# Patient Record
Sex: Male | Born: 1935 | Race: White | Hispanic: No | Marital: Married | Smoking: Former smoker
Health system: Southern US, Community
[De-identification: ages and names within clinical notes are randomized; demographics above are authoritative.]

## PROBLEM LIST (undated history)

## (undated) DIAGNOSIS — I10 Essential (primary) hypertension: Secondary | ICD-10-CM

## (undated) DIAGNOSIS — I480 Paroxysmal atrial fibrillation: Secondary | ICD-10-CM

## (undated) DIAGNOSIS — R600 Localized edema: Secondary | ICD-10-CM

## (undated) DIAGNOSIS — G8929 Other chronic pain: Secondary | ICD-10-CM

## (undated) DIAGNOSIS — I428 Other cardiomyopathies: Secondary | ICD-10-CM

## (undated) DIAGNOSIS — M545 Low back pain, unspecified: Secondary | ICD-10-CM

## (undated) DIAGNOSIS — F1010 Alcohol abuse, uncomplicated: Secondary | ICD-10-CM

## (undated) DIAGNOSIS — Z9289 Personal history of other medical treatment: Secondary | ICD-10-CM

## (undated) DIAGNOSIS — I5032 Chronic diastolic (congestive) heart failure: Secondary | ICD-10-CM

## (undated) DIAGNOSIS — R2681 Unsteadiness on feet: Secondary | ICD-10-CM

## (undated) DIAGNOSIS — D649 Anemia, unspecified: Secondary | ICD-10-CM

## (undated) DIAGNOSIS — R001 Bradycardia, unspecified: Secondary | ICD-10-CM

## (undated) DIAGNOSIS — Z86718 Personal history of other venous thrombosis and embolism: Secondary | ICD-10-CM

## (undated) DIAGNOSIS — M1990 Unspecified osteoarthritis, unspecified site: Secondary | ICD-10-CM

## (undated) HISTORY — PX: TONSILLECTOMY AND ADENOIDECTOMY: SUR1326

## (undated) HISTORY — DX: Essential (primary) hypertension: I10

## (undated) HISTORY — PX: CARPAL TUNNEL RELEASE: SHX101

## (undated) HISTORY — DX: Unspecified osteoarthritis, unspecified site: M19.90

## (undated) HISTORY — PX: CAROTID ENDARTERECTOMY: SUR193

## (undated) HISTORY — DX: Paroxysmal atrial fibrillation: I48.0

## (undated) HISTORY — DX: Chronic diastolic (congestive) heart failure: I50.32

## (undated) HISTORY — DX: Other cardiomyopathies: I42.8

## (undated) HISTORY — DX: Unsteadiness on feet: R26.81

## (undated) HISTORY — DX: Personal history of other venous thrombosis and embolism: Z86.718

## (undated) HISTORY — PX: GANGLION CYST EXCISION: SHX1691

## (undated) HISTORY — DX: Alcohol abuse, uncomplicated: F10.10

## (undated) HISTORY — PX: JOINT REPLACEMENT: SHX530

## (undated) HISTORY — PX: CATARACT EXTRACTION W/ INTRAOCULAR LENS IMPLANT: SHX1309

---

## 1999-02-07 ENCOUNTER — Encounter: Payer: Self-pay | Admitting: Orthopedic Surgery

## 1999-02-07 ENCOUNTER — Inpatient Hospital Stay (HOSPITAL_COMMUNITY): Admission: EM | Admit: 1999-02-07 | Discharge: 1999-02-13 | Payer: Self-pay | Admitting: Orthopedic Surgery

## 1999-03-31 ENCOUNTER — Encounter: Payer: Self-pay | Admitting: Family Medicine

## 1999-03-31 ENCOUNTER — Ambulatory Visit (HOSPITAL_COMMUNITY): Admission: RE | Admit: 1999-03-31 | Discharge: 1999-03-31 | Payer: Self-pay | Admitting: Family Medicine

## 1999-04-15 ENCOUNTER — Encounter: Payer: Self-pay | Admitting: Neurological Surgery

## 1999-04-19 ENCOUNTER — Encounter: Payer: Self-pay | Admitting: Neurological Surgery

## 1999-04-19 ENCOUNTER — Inpatient Hospital Stay (HOSPITAL_COMMUNITY): Admission: RE | Admit: 1999-04-19 | Discharge: 1999-04-20 | Payer: Self-pay | Admitting: Neurological Surgery

## 1999-04-30 ENCOUNTER — Ambulatory Visit (HOSPITAL_COMMUNITY): Admission: RE | Admit: 1999-04-30 | Discharge: 1999-04-30 | Payer: Self-pay | Admitting: Orthopedic Surgery

## 1999-04-30 ENCOUNTER — Encounter: Payer: Self-pay | Admitting: Orthopedic Surgery

## 1999-05-11 ENCOUNTER — Encounter: Payer: Self-pay | Admitting: Neurological Surgery

## 1999-05-11 ENCOUNTER — Encounter: Admission: RE | Admit: 1999-05-11 | Discharge: 1999-05-11 | Payer: Self-pay | Admitting: Neurological Surgery

## 1999-06-18 ENCOUNTER — Encounter: Payer: Self-pay | Admitting: Neurological Surgery

## 1999-06-18 ENCOUNTER — Encounter: Admission: RE | Admit: 1999-06-18 | Discharge: 1999-06-18 | Payer: Self-pay | Admitting: Neurological Surgery

## 1999-07-19 DIAGNOSIS — Z9289 Personal history of other medical treatment: Secondary | ICD-10-CM

## 1999-07-19 HISTORY — PX: ANTERIOR FUSION CERVICAL SPINE: SUR626

## 1999-07-19 HISTORY — PX: TOTAL HIP ARTHROPLASTY: SHX124

## 1999-07-19 HISTORY — DX: Personal history of other medical treatment: Z92.89

## 2000-09-06 ENCOUNTER — Encounter: Admission: RE | Admit: 2000-09-06 | Discharge: 2000-12-05 | Payer: Self-pay | Admitting: Family Medicine

## 2000-10-12 ENCOUNTER — Ambulatory Visit (HOSPITAL_COMMUNITY): Admission: RE | Admit: 2000-10-12 | Discharge: 2000-10-12 | Payer: Self-pay | Admitting: Orthopedic Surgery

## 2000-10-12 ENCOUNTER — Encounter: Payer: Self-pay | Admitting: Orthopedic Surgery

## 2000-12-06 ENCOUNTER — Encounter: Admission: RE | Admit: 2000-12-06 | Discharge: 2001-01-16 | Payer: Self-pay | Admitting: Family Medicine

## 2001-01-30 ENCOUNTER — Ambulatory Visit (HOSPITAL_COMMUNITY): Admission: RE | Admit: 2001-01-30 | Discharge: 2001-01-30 | Payer: Self-pay | Admitting: *Deleted

## 2002-10-31 ENCOUNTER — Encounter: Payer: Self-pay | Admitting: Neurological Surgery

## 2002-11-05 ENCOUNTER — Encounter (INDEPENDENT_AMBULATORY_CARE_PROVIDER_SITE_OTHER): Payer: Self-pay | Admitting: Specialist

## 2002-11-05 ENCOUNTER — Ambulatory Visit (HOSPITAL_COMMUNITY): Admission: RE | Admit: 2002-11-05 | Discharge: 2002-11-05 | Payer: Self-pay | Admitting: Neurological Surgery

## 2003-02-25 ENCOUNTER — Ambulatory Visit (HOSPITAL_BASED_OUTPATIENT_CLINIC_OR_DEPARTMENT_OTHER): Admission: RE | Admit: 2003-02-25 | Discharge: 2003-02-25 | Payer: Self-pay | Admitting: Orthopedic Surgery

## 2003-09-04 ENCOUNTER — Emergency Department (HOSPITAL_COMMUNITY): Admission: EM | Admit: 2003-09-04 | Discharge: 2003-09-04 | Payer: Self-pay | Admitting: Emergency Medicine

## 2003-09-15 ENCOUNTER — Encounter: Admission: RE | Admit: 2003-09-15 | Discharge: 2003-09-15 | Payer: Self-pay | Admitting: Family Medicine

## 2003-09-15 ENCOUNTER — Emergency Department (HOSPITAL_COMMUNITY): Admission: EM | Admit: 2003-09-15 | Discharge: 2003-09-16 | Payer: Self-pay | Admitting: Emergency Medicine

## 2004-09-08 ENCOUNTER — Encounter: Admission: RE | Admit: 2004-09-08 | Discharge: 2004-09-08 | Payer: Self-pay | Admitting: Family Medicine

## 2004-12-01 ENCOUNTER — Ambulatory Visit (HOSPITAL_COMMUNITY): Admission: RE | Admit: 2004-12-01 | Discharge: 2004-12-01 | Payer: Self-pay | Admitting: Neurological Surgery

## 2005-02-07 ENCOUNTER — Encounter: Admission: RE | Admit: 2005-02-07 | Discharge: 2005-02-07 | Payer: Self-pay | Admitting: Family Medicine

## 2005-06-16 ENCOUNTER — Encounter: Admission: RE | Admit: 2005-06-16 | Discharge: 2005-06-16 | Payer: Self-pay | Admitting: Family Medicine

## 2006-02-13 ENCOUNTER — Ambulatory Visit (HOSPITAL_COMMUNITY): Admission: RE | Admit: 2006-02-13 | Discharge: 2006-02-13 | Payer: Self-pay | Admitting: *Deleted

## 2006-09-19 ENCOUNTER — Ambulatory Visit: Payer: Self-pay | Admitting: Vascular Surgery

## 2007-07-03 ENCOUNTER — Ambulatory Visit: Payer: Self-pay | Admitting: Vascular Surgery

## 2007-07-10 ENCOUNTER — Encounter: Admission: RE | Admit: 2007-07-10 | Discharge: 2007-07-10 | Payer: Self-pay | Admitting: Family Medicine

## 2007-11-05 ENCOUNTER — Encounter: Admission: RE | Admit: 2007-11-05 | Discharge: 2008-02-03 | Payer: Self-pay | Admitting: Family Medicine

## 2008-03-03 ENCOUNTER — Encounter: Admission: RE | Admit: 2008-03-03 | Discharge: 2008-03-03 | Payer: Self-pay | Admitting: Family Medicine

## 2008-04-22 ENCOUNTER — Ambulatory Visit: Payer: Self-pay | Admitting: Vascular Surgery

## 2008-07-17 ENCOUNTER — Ambulatory Visit: Payer: Self-pay | Admitting: Vascular Surgery

## 2008-08-27 ENCOUNTER — Inpatient Hospital Stay (HOSPITAL_COMMUNITY): Admission: RE | Admit: 2008-08-27 | Discharge: 2008-08-28 | Payer: Self-pay | Admitting: Vascular Surgery

## 2008-08-27 ENCOUNTER — Ambulatory Visit: Payer: Self-pay | Admitting: Vascular Surgery

## 2008-08-27 ENCOUNTER — Encounter: Payer: Self-pay | Admitting: Vascular Surgery

## 2008-09-16 ENCOUNTER — Ambulatory Visit: Payer: Self-pay | Admitting: Vascular Surgery

## 2008-10-10 ENCOUNTER — Ambulatory Visit: Payer: Self-pay | Admitting: Vascular Surgery

## 2008-10-13 ENCOUNTER — Ambulatory Visit: Payer: Self-pay | Admitting: Vascular Surgery

## 2009-03-31 ENCOUNTER — Ambulatory Visit: Payer: Self-pay | Admitting: Vascular Surgery

## 2009-07-09 ENCOUNTER — Ambulatory Visit: Payer: Self-pay | Admitting: Vascular Surgery

## 2009-07-09 ENCOUNTER — Encounter (INDEPENDENT_AMBULATORY_CARE_PROVIDER_SITE_OTHER): Payer: Self-pay | Admitting: Family Medicine

## 2009-07-09 ENCOUNTER — Ambulatory Visit (HOSPITAL_COMMUNITY): Admission: RE | Admit: 2009-07-09 | Discharge: 2009-07-09 | Payer: Self-pay | Admitting: Family Medicine

## 2009-07-16 ENCOUNTER — Ambulatory Visit (HOSPITAL_COMMUNITY): Admission: RE | Admit: 2009-07-16 | Discharge: 2009-07-16 | Payer: Self-pay | Admitting: Family Medicine

## 2009-08-20 ENCOUNTER — Ambulatory Visit (HOSPITAL_COMMUNITY): Admission: RE | Admit: 2009-08-20 | Discharge: 2009-08-20 | Payer: Self-pay | Admitting: Family Medicine

## 2009-10-06 ENCOUNTER — Ambulatory Visit: Payer: Self-pay | Admitting: Vascular Surgery

## 2010-08-07 ENCOUNTER — Encounter: Payer: Self-pay | Admitting: Family Medicine

## 2010-08-08 ENCOUNTER — Encounter: Payer: Self-pay | Admitting: Neurological Surgery

## 2010-09-21 ENCOUNTER — Other Ambulatory Visit: Payer: Self-pay

## 2010-10-05 ENCOUNTER — Other Ambulatory Visit (INDEPENDENT_AMBULATORY_CARE_PROVIDER_SITE_OTHER): Payer: Medicare Other

## 2010-10-05 DIAGNOSIS — Z48812 Encounter for surgical aftercare following surgery on the circulatory system: Secondary | ICD-10-CM

## 2010-10-05 DIAGNOSIS — I6529 Occlusion and stenosis of unspecified carotid artery: Secondary | ICD-10-CM

## 2010-10-12 NOTE — Procedures (Unsigned)
CAROTID DUPLEX EXAM  INDICATION:  Follow up left carotid endarterectomy.  HISTORY: Diabetes:  No. Cardiac:  No. Hypertension:  Yes. Smoking:  Previous. Previous Surgery:  Left carotid endarterectomy, 08/27/2008. CV History:  Currently asymptomatic. Amaurosis Fugax No, Paresthesias No, Hemiparesis No.                                      RIGHT             LEFT Brachial systolic pressure:         138               135 Brachial Doppler waveforms:         Normal            Normal Vertebral direction of flow:        Antegrade         Antegrade DUPLEX VELOCITIES (cm/sec) CCA peak systolic                   77                74 ECA peak systolic                   120               189 ICA peak systolic                   227               71 ICA end diastolic                   61                22 PLAQUE MORPHOLOGY:                  Mixed             Mixed PLAQUE AMOUNT:                      Moderate          Moderate PLAQUE LOCATION:                    ICA, ECA          ECA  IMPRESSION: 1. Right internal carotid artery velocities suggest 60% to 79%     stenosis. 2. Patent left carotid endarterectomy site with no evidence of     restenosis of the internal carotid artery. 3. Bilateral external carotid artery stenosis.  ___________________________________________ Quita Skye Hart Rochester, M.D.  EM/MEDQ  D:  10/06/2010  T:  10/06/2010  Job:  [id number]

## 2010-11-02 LAB — COMPREHENSIVE METABOLIC PANEL
ALT: 26 U/L (ref 0–53)
AST: 24 U/L (ref 0–37)
Albumin: 3.9 g/dL (ref 3.5–5.2)
Alkaline Phosphatase: 81 U/L (ref 39–117)
BUN: 44 mg/dL — ABNORMAL HIGH (ref 6–23)
CO2: 23 mEq/L (ref 19–32)
Calcium: 9.2 mg/dL (ref 8.4–10.5)
Chloride: 106 mEq/L (ref 96–112)
Creatinine, Ser: 1.54 mg/dL — ABNORMAL HIGH (ref 0.4–1.5)
GFR calc Af Amer: 53 mL/min — ABNORMAL LOW (ref >60)
GFR calc non Af Amer: 44 mL/min — ABNORMAL LOW (ref >60)
Glucose, Bld: 91 mg/dL (ref 70–99)
Potassium: 5.7 mEq/L — ABNORMAL HIGH (ref 3.5–5.1)
Sodium: 135 mEq/L (ref 135–145)
Total Bilirubin: 0.6 mg/dL (ref 0.3–1.2)
Total Protein: 6.3 g/dL (ref 6.0–8.3)

## 2010-11-02 LAB — CBC
HCT: 28.8 % — ABNORMAL LOW (ref 39.0–52.0)
HCT: 35.5 % — ABNORMAL LOW (ref 39.0–52.0)
Hemoglobin: 10.1 g/dL — ABNORMAL LOW (ref 13.0–17.0)
Hemoglobin: 12.2 g/dL — ABNORMAL LOW (ref 13.0–17.0)
MCHC: 34.4 g/dL (ref 30.0–36.0)
MCHC: 35 g/dL (ref 30.0–36.0)
MCV: 97.9 fL (ref 78.0–100.0)
MCV: 98.5 fL (ref 78.0–100.0)
Platelets: 132 10*3/uL — ABNORMAL LOW (ref 150–400)
Platelets: 167 10*3/uL (ref 150–400)
RBC: 2.95 MIL/uL — ABNORMAL LOW (ref 4.22–5.81)
RBC: 3.61 MIL/uL — ABNORMAL LOW (ref 4.22–5.81)
RDW: 13.6 % (ref 11.5–15.5)
RDW: 14.1 % (ref 11.5–15.5)
WBC: 10.8 10*3/uL — ABNORMAL HIGH (ref 4.0–10.5)
WBC: 8.7 10*3/uL (ref 4.0–10.5)

## 2010-11-02 LAB — BASIC METABOLIC PANEL
BUN: 26 mg/dL — ABNORMAL HIGH (ref 6–23)
CO2: 22 mEq/L (ref 19–32)
Calcium: 8.2 mg/dL — ABNORMAL LOW (ref 8.4–10.5)
Chloride: 108 mEq/L (ref 96–112)
Creatinine, Ser: 0.99 mg/dL (ref 0.4–1.5)
GFR calc Af Amer: >60 mL/min (ref >60)
GFR calc non Af Amer: >60 mL/min (ref >60)
Glucose, Bld: 123 mg/dL — ABNORMAL HIGH (ref 70–99)
Potassium: 3.9 mEq/L (ref 3.5–5.1)
Sodium: 134 mEq/L — ABNORMAL LOW (ref 135–145)

## 2010-11-02 LAB — CROSSMATCH
ABO/RH(D): A POS
Antibody Screen: NEGATIVE

## 2010-11-02 LAB — URINALYSIS, ROUTINE W REFLEX MICROSCOPIC
Bilirubin Urine: NEGATIVE
Glucose, UA: NEGATIVE mg/dL
Hgb urine dipstick: NEGATIVE
Ketones, ur: NEGATIVE mg/dL
Nitrite: NEGATIVE
Protein, ur: NEGATIVE mg/dL
Specific Gravity, Urine: 1.019 (ref 1.005–1.030)
Urobilinogen, UA: 0.2 mg/dL (ref 0.0–1.0)
pH: 6 (ref 5.0–8.0)

## 2010-11-02 LAB — ABO/RH: ABO/RH(D): A POS

## 2010-11-02 LAB — POTASSIUM: Potassium: 4.4 mEq/L (ref 3.5–5.1)

## 2010-11-02 LAB — PROTIME-INR
INR: 1.1 (ref 0.00–1.49)
Prothrombin Time: 14.4 seconds (ref 11.6–15.2)

## 2010-11-02 LAB — APTT: aPTT: 30 seconds (ref 24–37)

## 2010-11-30 NOTE — Assessment & Plan Note (Signed)
OFFICE VISIT   MUSCAB, BRENNEMAN  DOB:  1926/08/14                                       04/22/2008  JWJXB#:[id number]   The patient returned today for his annual followup for carotid occlusive  disease which we have been following for several years.  He has a known  moderate to moderately severe left internal carotid stenosis which has  remained asymptomatic.  I last saw him December 2008.  He had no  symptoms of ischemia, such as hemiparesis, aphasia, amaurosis fugax,  diplopia, blurred vision or syncope.  He also has no chest pain, dyspnea  on exertion, PND or orthopnea.   PHYSICAL EXAM:  Today blood pressure 161/90, heart rate 76, respirations  14.  The carotid pulses 3+ with a soft bruit on the left.  Neurologic:  Normal.  Neck is somewhat rigid from a previous cervical spine fusion.  Chest:  Clear to auscultation.  Abdomen:  Soft, nontender with no  masses.  He has 3+ femoral popliteal pulses bilaterally with well-  perfused lower extremities.   Carotid duplex exam today reveals significant progression of disease in  the last 9 months with a left internal carotid artery now being  approximate 85-90% stenotic in severity.  The right internal carotid  being approximately 70% stenotic.  I have discussed this with the  patient and I have recommended proceeding with a left carotid  endarterectomy in the near future for this asymptomatic severe stenosis.  Risks and benefits of surgery have been thoroughly discussed and would  like to discuss this further with Dr. Artis Flock.  He has a fishing trip  planned in November of this year.  I have suggested that possibly the  surgery can be done shortly after he returns from that trip, unless he  develops any symptoms in the interim.   Quita Skye Hart Rochester, M.D.  Electronically Signed   JDL/MEDQ  D:  04/22/2008  T:  04/23/2008  Job:  [id number]

## 2010-11-30 NOTE — Procedures (Signed)
CAROTID DUPLEX EXAM   INDICATION:  Left carotid endarterectomy.   HISTORY:  Diabetes:  No.  Cardiac:  No.  Hypertension:  Yes.  Smoking:  Previous.  Previous Surgery:  Left carotid endarterectomy on 08/27/08.  CV History:  Currently asymptomatic.  Amaurosis Fugax No, Paresthesias No, Hemiparesis No.                                       RIGHT             LEFT  Brachial systolic pressure:         138               144  Brachial Doppler waveforms:         Normal            Normal  Vertebral direction of flow:        Not visualized    Antegrade  DUPLEX VELOCITIES (cm/sec)  CCA peak systolic                   93                104  ECA peak systolic                   173               230  ICA peak systolic                   208               57  ICA end diastolic                   47                21  PLAQUE MORPHOLOGY:                  Mixed  PLAQUE AMOUNT:                      Moderate          None  PLAQUE LOCATION:                    ICA   IMPRESSION:  1. Low-end 60-79% stenosis of the right internal carotid artery.  2. Patent left carotid endarterectomy site with no evidence of left      internal carotid artery stenosis.  3. No significant change noted when compared to the previous      examination on 10/10/08.   ___________________________________________  Quita Skye. Hart [place], M.D.   CH/MEDQ  D:  03/31/2009  T:  04/01/2009  Job:  [id number]

## 2010-11-30 NOTE — Procedures (Signed)
CAROTID DUPLEX EXAM   INDICATION:  Followup, carotid artery disease.   HISTORY:  Diabetes:  No.  Cardiac:  No.  Hypertension:  Yes.  Smoking:  Quit.  Previous Surgery:  No.  CV History:  No.  Amaurosis Fugax No, Paresthesias No, Hemiparesis No                                       RIGHT               LEFT  Brachial systolic pressure:         170                 170  Brachial Doppler waveforms:         Triphasic           Triphasic  Vertebral direction of flow:        Antegrade           Antegrade  DUPLEX VELOCITIES (cm/sec)  CCA peak systolic                   80                  75  ECA peak systolic                   192                 190  ICA peak systolic                   219                 277  ICA end diastolic                   70                  104  PLAQUE MORPHOLOGY:                  Calcified           Calcified  PLAQUE AMOUNT:                      Moderate            Moderate/severe  PLAQUE LOCATION:                    ICA/ECA/bifurcation  ICA/ECA/bifurcation   IMPRESSION:  1. The right internal carotid artery shows evidence of 60-79% stenosis      (low end of range), showing slight increase from previous study.  2. The left internal carotid artery shows evidence of 60-79% stenosis      (high end of range), showing no significant change from previous      study.   ___________________________________________  Quita Skye Hart [place], M.D.   AS/MEDQ  D:  07/03/2007  T:  07/04/2007  Job:  [id number]

## 2010-11-30 NOTE — H&P (Signed)
NAME:  Phillip Kelley, Phillip Kelley NO.:  [id number]   MEDICAL RECORD NO.:  [id number]          PATIENT TYPE:  INP   LOCATION:  3316                         FACILITY:  [facility]   PHYSICIAN:  Quita Skye. Hart Rochester, M.D.  DATE OF BIRTH:  [birth year]   DATE OF ADMISSION:  08/27/2008  DATE OF DISCHARGE:                              HISTORY & PHYSICAL   CHIEF COMPLAINT:  Severe left internal carotid occlusive disease -  asymptomatic.   HISTORY OF PRESENT ILLNESS:  This 77 year old male patient has been  followed for the last few years with bilateral carotid occlusive disease  in the 70% range on both sides.  This was asymptomatic.  His last exam  in October 2009 revealed significant progression of disease to between  85-90% severity on the left side.  He continues to be asymptomatic,  scheduled now for left carotid endarterectomy.   PAST MEDICAL HISTORY:  1. Hypertension.  2. Degenerative joint disease with previous cervical spine fusion.  3. Negative coronary artery disease, diabetes, COPD, or stroke.   PAST SURGERIES:  1. Mastoidectomy.  2. Right hip replacement.  3. Removal of bone from left foot.  4. Cervical spine fusion.   FAMILY HISTORY:  Positive for congestive heart failure in his mother and  diabetes in a son.   SOCIAL HISTORY:  He is married.  He has 3 children.  He is retired.  He  has not smoked since 1979.  He drinks occasional wine.   REVIEW OF SYSTEMS:  Mainly positive for arthritis with restricted motion  of the neck due to cervical spine fusion, joint pain, and muscle pain.   ALLERGIES:  PENICILLIN.   MEDICATIONS:  1. Ibuprofen six 200 mg tablets, 4 in the morning and 2 in the      afternoon.  2. one 81 mg tablet q.a.m.  3. Glucosamine/chondroitin 2 tablets 1 in the morning and 1 in the      p.m.  4. Vitamin E 400 mcg 1 the morning.  5. Calcium 600 plus D 1 in the morning.  6. Flomax 0.4 mg at 2 p.m.  7. Benicar/hydrochlorothiazide 20-12.5 one in the  morning.   PHYSICAL EXAMINATION:  VITAL SIGNS:  Blood pressure 160/90, heart rate  70, and respirations 14.  GENERAL:  He is an elderly male who is in no apparent distress, but has  obvious rigidity of his neck with limited anterior, posterior, and  mediolateral movement.  His carotid pulses 3+ with harsh bruit on the  left, softer bruit on the right.  NEUROLOGIC:  Normal.  NECK:  No palpable adenopathy in the neck.  CHEST:  Clear to auscultation.  CARDIOVASCULAR:  Regular rhythm.  No murmurs.  ABDOMEN:  Soft and nontender with no masses.  EXTREMITIES:  He has 3+ femoral pulse bilaterally with well-perfused  lower extremities.   Carotid duplex exam performed in the [facility] office on April 22, 2008,  revealed a 90% left internal carotid stenosis and a 70% right internal  carotid stenosis.   IMPRESSION:  Severe left internal carotid stenosis - asymptomatic.   PLAN:  To admit the patient  on August 27, 2008, for an elective left  carotid endarterectomy.  Risks and benefits have been thoroughly  discussed with the patient and he would like to proceed.      Quita Skye Hart Rochester, M.D.  Electronically Signed     JDL/MEDQ  D:  08/27/2008  T:  08/27/2008  Job:  [id number]

## 2010-11-30 NOTE — Assessment & Plan Note (Signed)
OFFICE VISIT   KAMARE, CASPERS  DOB:  1926/08/04                                       09/16/2008  MVHQI#:[id number]   This is a office visit.  The patient underwent a left carotid  endarterectomy with Dacron patch angioplasty plus resection of redundant  common carotid with primary reanastomosis by me on February 10 for  severe left internal carotid stenosis which is asymptomatic.  His  procedure was technically difficult because of a fused neck and very  little mobility or hyperextension.  He has had no problems and no  specific complaints.  He is swallowing well, has no hoarseness and  denies any hemiparesis, aphasia, amaurosis fugax, diplopia, blurred  vision or syncope.  He is taking aspirin 81 mg per day.   PHYSICAL EXAMINATION:  Blood pressure 144/76, heart rate 66,  respirations 14.  NECK:  Left neck incision is healed nicely.  Carotid pulses 3+ and no  audible bruits.  NEUROLOGIC:  Normal.   He is getting along well and will see him in 6 months with follow-up  carotid duplex exam.  He has moderate stenosis in the 60 to 70% range on  the right side.  If he has any symptoms in the interim, he will be in  touch.   Quita Skye Hart Rochester, M.D.  Electronically Signed   JDL/MEDQ  D:  09/16/2008  T:  09/17/2008  Job:  [id number]   cc:   Gloriajean Dell. Andrey Campanile, M.D.

## 2010-11-30 NOTE — Procedures (Signed)
CAROTID DUPLEX EXAM   INDICATION:  Left carotid endarterectomy.   HISTORY:  Diabetes:  No.  Cardiac:  No.  Hypertension:  Yes.  Smoking:  Previous.  Previous Surgery:  Left carotid endarterectomy on 08/27/2008.  CV History:  Currently asymptomatic.  Amaurosis Fugax No, Paresthesias No, Hemiparesis No                                       RIGHT             LEFT  Brachial systolic pressure:         144               132  Brachial Doppler waveforms:         Normal            Normal  Vertebral direction of flow:        Not visualized    Antegrade  DUPLEX VELOCITIES (cm/sec)  CCA peak systolic                   86                82  ECA peak systolic                   184               234  ICA peak systolic                   205               49  ICA end diastolic                   43                17  PLAQUE MORPHOLOGY:                  Mixed             Heterogeneous  PLAQUE AMOUNT:                      Moderate          Moderate  PLAQUE LOCATION:                    ICA / ECA         ECA   IMPRESSION:  1. Low end 60%-79% stenosis of the right internal carotid artery.  2. Patent left carotid endarterectomy site with no left internal      carotid artery stenosis.  3. Bilateral external carotid artery stenoses noted.  4. Unable to adequately visualize the right vertebral artery.  5. No significant change noted when compared to the previous exam on      03/31/2009.   ___________________________________________  Quita Skye. Hart Rochester, M.D.   CH/MEDQ  D:  10/06/2009  T:  10/07/2009  Job:  [id number]

## 2010-11-30 NOTE — Assessment & Plan Note (Signed)
OFFICE VISIT   DEANGELO, BERNS  DOB:  1927/01/22                                       10/13/2008  EAVWU#:[id number]   Mr. Arpin underwent left carotid endarterectomy by me on February 10,  for severe but asymptomatic left internal carotid artery stenosis.  He  came to the office three days ago with some mild erythema at the base of  the incision.  He had no chills or fever.  A Duplex scan revealed no  evidence of any fluid in the deep space around the artery.  No evidence  of infection or pseudoaneurysm.  He was started on Cipro which he has  taken through the weekend as well as warm compresses.  The incision  today is essentially healed with the exception of one small 2 mm opening  at the base where the Vicryl knot was tied.  This was examined and there  is no evidence of any significant cellulitis.  He will continue the warm  compresses and antibiotics, and return to see [place] in 6 months as  scheduled unless he has any evidence of progressive infection.  This  should heal over the next few days.   Quita Skye Hart Rochester, M.D.  Electronically Signed   JDL/MEDQ  D:  10/13/2008  T:  10/13/2008  Job:  [id number]

## 2010-11-30 NOTE — Op Note (Signed)
NAME:  ARISTOTELIS, VILARDI NO.:  [id number]   MEDICAL RECORD NO.:  [id number]           PATIENT TYPE:   LOCATION:                                 FACILITY:   PHYSICIAN:  Quita Skye. Hart Rochester, M.D.       DATE OF BIRTH:   DATE OF PROCEDURE:  08/27/2008  DATE OF DISCHARGE:                               OPERATIVE REPORT   PREOPERATIVE DIAGNOSIS:  Severe left internal carotid stenosis -  asymptomatic.   POSTOPERATIVE DIAGNOSIS:  Severe left internal carotid stenosis -  asymptomatic.   OPERATIONS:  1. Left carotid endarterectomy with Dacron patch angioplasties  2. Resection of redundant common carotid artery with primary re-      anastomosis.   SURGEON:  Quita Skye. Hart Rochester, MD   FIRST ASSISTANT:  Jerold Coombe, PA   ANESTHESIA:  General endotracheal.   BRIEF HISTORY:  This patient has been followed with moderate carotid  occlusive disease and his most recent study revealed progression of  disease on the left side approximately 85-90% severity.  He has a 60-70%  stenosis on the contralateral right side.  He is asymptomatic and  scheduled now for left carotid endarterectomy.   DESCRIPTION OF PROCEDURE:  The patient was taken to the operating room  and placed in the supine position at which time, satisfactory general  endotracheal anesthesia was administered.  The patient had a rigid neck  having had previous cervical spine fusion with no mobility of the neck,  either anterior-posterior or medial-laterally making this procedure  difficult.  Incision was made along the anterior border of the  sternocleidomastoid muscle and carried down through subcutaneous tissue  and platysma using the Bovie.  Common facial vein and external jugular  veins ligated with 3-0 silk ties and divided exposing the common  internal and external carotid arteries.  Care was taken not to injure  the vagus or hypoglossal nerves both of which were exposed.  There was a  calcified plaque at the  carotid bifurcation extending up the internal  carotid artery at least 4 cm.  Distal vessel had a significant kink  posteriorly at the end of the plaque and it was obvious that a resection  would be required to eliminate this kink.  After dissecting this all  free, the patient was heparinized and carotid vessels were occluded with  vascular clamps.  Longitudinal opening was made in the common carotid  with a 15 blade extended up the internal carotid with the Potts scissors  to a point distal to the disease.  The plaque was at least 80-90%  stenotic in severity.  Distal vessel appeared normal.  A #10 shunt was  inserted without difficulty re-establishing flow in about 2 minutes.  Standard endarterectomy was then performed using elevator and Potts  scissors with an eversion endarterectomy of the external carotid.  The  plaque feathered off to distal internal carotid artery nicely, not  requiring any tacking sutures.  Lumen was thoroughly irrigated with  heparin saline.  All loose debris was carefully removed and in order to  eliminate the kink, resection of about  3 cm of common carotid artery was  performed after using marking sutures of 6-0 Prolene.  The section of  artery was resected in the common carotid was done with Potts scissors  with a continuous re-anastomosis with 6-0 Prolene having been performed.  Following this, a patch was sewn into place.  Prior to completion of the  closure, shunt was removed after about 55 minutes of shunt time  following antegrade and retrograde flushing, closure was completed re-  establishing flow initially up the external and up the internal branch.  Carotid was occluded for less than 2 minutes for removal of the shunt.  Protamine was then given to reverse the heparin.  There was excellent  flow  in the external and internal carotid branches and the kink had been  eliminated.  Following adequate hemostasis, wound was closed in layers  with Vicryl in  subcuticular fashion.  Sterile dressing was applied.  The  patient taken to recovery room in satisfactory condition.      Quita Skye Hart Rochester, M.D.  Electronically Signed     JDL/MEDQ  D:  08/27/2008  T:  08/27/2008  Job:  [id number]

## 2010-11-30 NOTE — Procedures (Signed)
CAROTID DUPLEX EXAM   INDICATION:  Redness and pain around endarterectomy site.   HISTORY:  Diabetes:  No.  Cardiac:  No.  Hypertension:  Yes.  Smoking:  Former smoker.  Previous Surgery:  Left carotid endarterectomy with Dacron patch  angioplasty on 08/27/2008.  CV History:  Previous duplex on 04/22/2008 (preop) revealed 60-79% right  ICA stenosis and 80-99% left ICA stenosis.  Amaurosis Fugax No, Paresthesias No, Hemiparesis No                                       RIGHT             LEFT  Brachial systolic pressure:         180               176  Brachial Doppler waveforms:         Triphasic         Triphasic  Vertebral direction of flow:        Antegrade         Antegrade  DUPLEX VELOCITIES (cm/sec)  CCA peak systolic                   85                111  ECA peak systolic                   166               175  ICA peak systolic                   214               62  ICA end diastolic                   55                21  PLAQUE MORPHOLOGY:                  Calcified         None  PLAQUE AMOUNT:                      Moderate          None  PLAQUE LOCATION:                    Proximal ICA      None   IMPRESSION:  1. 40-59% right ICA stenosis.  2. No left ICA stenosis status post endarterectomy.  3. No evidence of pseudoaneurysm or AV fistula at the Dacron patch.      No obvious fluid collection seen around the Dacron patch.   ___________________________________________  Quita Skye. Hart Rochester, M.D.   MC/MEDQ  D:  10/10/2008  T:  10/10/2008  Job:  [id number]

## 2010-11-30 NOTE — Discharge Summary (Signed)
NAME:  Phillip Kelley, Phillip Kelley NO.:  [id number]   MEDICAL RECORD NO.:  [id number]          PATIENT TYPE:  INP   LOCATION:  3316                         FACILITY:  [facility]   PHYSICIAN:  Quita Skye. Hart Rochester, M.D.  DATE OF BIRTH:  November 12, 1926   DATE OF ADMISSION:  08/27/2008  DATE OF DISCHARGE:  08/28/2008                               DISCHARGE SUMMARY   FINAL DISCHARGE DIAGNOSES:  1. Peripheral vascular disease resulting in left carotid occlusive      disease, which is asymptomatic.  2. Hypertension.   PROCEDURE PERFORMED:  Left carotid endarterectomy with resection of  redundant left common carotid artery by Dr. Hart Rochester on August 27, 2008.   COMPLICATIONS:  None.   CONDITION AT DISCHARGE:  Stable, improved.   DISCHARGE MEDICATIONS:  He is instructed to resume all of his previous  medications consisting of:  1. Benicar.  2. Hydrochlorothiazide 20/12.5 q.a.m.  3. Flomax 0.4 mg p.o. q.p.m.  4. Aspirin 81 mg p.o. daily.  5. Glucosamine triple strength b.i.d.  6. Ibuprofen 200 mg for q.a.m. to q.p.m.  7. Multivitamins 1 p.o. daily.  8. Calcium 600 plus D p.o. q.a.m.  9. He was given prescription for oxycodone 5 mg p.o. q.4 h. p.r.n.      pain, total number of 30 were given.   DISPOSITION:  He has been given careful instructions regarding the care  of his wounds and his activity level.  He was given a return appointment  to see Dr. Hart Rochester in 2 weeks for followup.   Brief identifying statement for complete details, please refer the typed  history and physical.  Briefly, this is a very pleasant 77 year old  gentleman status post neck fusion, was referred to Dr. Hart Rochester for  asymptomatic occlusive disease of his left internal carotid artery.  Dr.  Hart Rochester evaluated him and found him to be a suitable candidate for a  carotid endarterectomy.  He was informed of the risks and benefits of  the procedure and after careful consideration, elected to proceed with  surgery.   HOSPITAL COURSE:  Preoperative workup was completed as an outpatient.  He was brought in through the same day surgery and underwent the  aforementioned left carotid endarterectomy.  For complete details,  please refer to the typed operative report.  The procedure was without  complications.  He was returned to the Postanesthesia Care Unit  extubated.  Following stabilization, he was transferred to a bed on a  surgical step-down unit.  He was observed overnight.  The following  morning, his neurological exam is nonfocal.  His wound was healing well  and was subsequently discharged home in stable condition.      Wilmon Arms, PA      Quita Skye Hart Rochester, M.D.  Electronically Signed    KEL/MEDQ  D:  08/28/2008  T:  08/28/2008  Job:  [id number]

## 2010-11-30 NOTE — Assessment & Plan Note (Signed)
OFFICE VISIT   AXAVIER, PRESSLEY  DOB:  [birth year]                                       03/31/2009  ZOXWR#:[id number]   The patient returns having undergone a left carotid endarterectomy by me  in 08/2008 for severe left internal carotid stenosis which is  asymptomatic with resection of a redundant common carotid artery and  primary reanastomosis.  He has had a known 60-70% right internal carotid  artery stenosis which we have been following.  He denies any current  hemispheric or nonhemispheric TIAs, amaurosis fugax, diplopia, blurred  vision or syncope.  He is ambulating fairly well.  He denies any cardiac  symptoms such as chest pain or dyspnea on exertion.  He is taking one  aspirin 81 mg tablet per day.   PHYSICAL EXAM:  Blood pressure 151/78, heart rate 58, respirations 14.  Neck:  His carotid pulses are 3+ with soft bruit on the right.  Neurological:  Normal.  He continues to have significant cervical spine  fusion with flexion of the neck.  Abdomen:  Soft, nontender and no  masses palpable.  He has 3+ femoral and popliteal pulses bilaterally  with well-perfused lower extremities.   Carotid duplex exam today reveals a widely patent left carotid  endarterectomy site and a mild to moderate right internal carotid  stenosis approximating 60-70% - unchanged.  I have reassured him  regarding these findings and we will begin checking him on the carotid  protocol in 6 months and then on an annual basis unless he has any  evidence of progression of disease or develops any symptoms.   Quita Skye Hart Rochester, M.D.  Electronically Signed   JDL/MEDQ  D:  03/31/2009  T:  04/01/2009  Job:  [id number]

## 2010-11-30 NOTE — Assessment & Plan Note (Signed)
OFFICE VISIT   Phillip Kelley, Phillip Kelley  DOB:  March 01, 1927                                       07/03/2007  EAVWU#:[id number]   The patient returns today for continued followup regarding his carotid  occlusive disease.  Initially when discovered, he had had some dizziness  and syncope a few years ago, but he has had no neurologic symptoms in  the past 2 years, including hemiparesis, aphasia, amaurosis fugax,  diplopia, blurred vision, or syncope.  He also has no chest pain,  dyspnea on exertion, PND, orthopnea, or lower extremity claudication  symptoms.  He does have some limitation of his activities because of  stiffness of his neck.  He does take one aspirin per day.   PHYSICAL EXAMINATION:  Blood pressure 168/81, heart rate is 59,  respirations 16.  His carotid pulses are 3+ with soft bruits  bilaterally.  Neurologic exam is unremarkable.  No palpable adenopathy  in the neck.  Mobility of his neck is limited both anterior-posteriorly  and side to side.  Upper extremity pulses are 3+ bilaterally.  Abdomen  is soft and nontender with no palpable masses.  He has 3+ femoral,  popliteal, and posterior tibial pulses bilaterally.   Carotid duplex exam today show progressed in severity since 9 months ago  with approximately 80% left internal carotid stenosis and approximately  60-70% right internal carotid stenosis.   I discussed the pros and cons of proceeding with left carotid  endarterectomy.  It will be technically somewhat more difficulty because  of immobility of his neck, but certainly doable.  He would like to hold  off for now unless he develops any symptoms, and return in 9 months for  a  followup carotid scan.  He understands that there is some risk involved  in this and also the 1% stroke related to surgery if he should elect to  proceed.   Quita Skye Hart [place], M.D.  Electronically Signed   JDL/MEDQ  D:  07/03/2007  T:  07/04/2007  Job:  [id number]   cc:    Quita Skye. Artis Flock, M.D.

## 2010-11-30 NOTE — Procedures (Signed)
CAROTID DUPLEX EXAM   INDICATION:  Follow-up evaluation of known carotid artery disease.   HISTORY:  Diabetes:  No.  Cardiac:  No.  Hypertension:  Yes.  Smoking:  Patient is a former smoker.  Previous Surgery:  No.  CV History:  Previous duplex on 07/03/07 revealed 60-79% right ICA  stenosis and 60-79% left ICA stenosis.  Amaurosis Fugax No, Paresthesias No, Hemiparesis No.                                       RIGHT             LEFT  Brachial systolic pressure:         146               148  Brachial Doppler waveforms:         Triphasic         Triphasic  Vertebral direction of flow:        Antegrade         Antegrade  DUPLEX VELOCITIES (cm/sec)  CCA peak systolic                   82                74  ECA peak systolic                   192               165  ICA peak systolic                   271               336  ICA end diastolic                   72                128  PLAQUE MORPHOLOGY:                  Calcified         Calcified  PLAQUE AMOUNT:                      Moderate-to-severe                  Severe  PLAQUE LOCATION:                    Proximal ICA, ECA Proximal ICA   IMPRESSION:  1. 60-79% right internal carotid artery stenosis.  2. 80-99% left internal carotid artery stenosis.  3. Bilateral external carotid artery stenosis.   ___________________________________________  Quita Skye Hart [place], M.D.   MC/MEDQ  D:  04/22/2008  T:  04/22/2008  Job:  [id number]

## 2010-12-03 NOTE — Procedures (Signed)
[place]  Patient:    Phillip Kelley, Phillip Kelley                     MRN: [id number] Proc. Date: 01/30/01 Adm. Date:  [id number] Attending:  Sabino Gasser CC:         Quita Skye. Artis Flock, M.D.   Procedure Report  PROCEDURE PERFORMED:  Colonoscopy.  ENDOSCOPIST:  Sabino Gasser, M.D.  INDICATIONS FOR PROCEDURE:  Colon cancer screening.  ANESTHESIA:  Demerol 25 mg, Versed 2.5 mg.  He had hemoccult positive stools.  DESCRIPTION OF PROCEDURE:  With the patient mildly sedated in the left lateral decubitus position, a rectal examination was performed which was unremarkable.  Subsequently, the Olympus videoscopic variable stiffness colonoscope was inserted in the rectum and passed under direct vision into the cecum.  The cecum was identified by the ileocecal valve and appendiceal orifice, both of which were photographed.  From this point, the colonoscope was slowly withdrawn, taking circumferential views of the entire colonic mucosa, stopping only to photograph diverticula seen in the sigmoid colon until we reached the rectum, which appeared normal on direct view and showed internal hemorrhoids on retroflex view.  The endoscope was straightened and withdrawn.  Patients vital signs and pulse oximeter remained stable.  The patient tolerated the procedure well and without apparent complications.  FINDINGS:  Diverticulosis of sigmoid colon.  Internal hemorrhoids.  Otherwise unremarkable examination.  PLAN:  Will have the patient follow up with me as an outpatient. DD:  01/30/01 TD:  01/30/01 Job: [id number]

## 2010-12-03 NOTE — Op Note (Signed)
NAME:  Phillip Kelley, Phillip Kelley                        ACCOUNT NO.:  [id number]   MEDICAL RECORD NO.:  [id number]                   PATIENT TYPE:  AMB   LOCATION:  DSC                                  FACILITY:  [facility]   PHYSICIAN:  Leonides Grills, M.D.                  DATE OF BIRTH:  03-11-1927   DATE OF PROCEDURE:  02/25/2003  DATE OF DISCHARGE:                                 OPERATIVE REPORT   PREOPERATIVE DIAGNOSES:  1. Left foot ulcer.  2. Left medial cuneiform spur.  3. Left base of the 1st metatarsal spur.   POSTOPERATIVE DIAGNOSES:  1. Left foot ulcer.  2. Left medial cuneiform spur.  3. Left base of the 1st metatarsal spur.   OPERATION:  1. Irrigation and debridement to bone, left foot ulcer.  2. Excision of base of 1st metatarsal spur.  3. Excision left medial cuneiform spur.   ANESTHESIA:  General endotracheal tube anesthesia.   SURGEON:  Leonides Grills, M.D.   ASSISTANT:  Lianne Cure, P.A.   ESTIMATED BLOOD LOSS:  Minimal.   TOURNIQUET TIME:  Approximately  15 to 20 minutes.   DISPOSITION:  Stable to the recovery room.   INDICATIONS FOR PROCEDURE:  This is a 77 year old gentleman  who has  developed a spur underneath a ulcer which has been quite symptomatic since  March. He was consented for the above procedure. All risks which included  infection, neurovessel injury, recurrence of the spur and ulcer infection of  the deep bone, i.e. osteomyelitis were all explained and questions were  encouraged and answered.   DESCRIPTION OF PROCEDURE:  The patient was brought to the operating room and  placed in the supine position after adequate general endotracheal tube  anesthesia was administered as well as vancomycin 500 mg IV piggyback. The  left lower extremity was then prepped and draped in a sterile manner over a  proximally placed thigh tourniquet. The limb was gravity exsanguinated  anterior the tourniquet was elevated to 290 mmHg.   A longitudinal incision  just dorsal to the ulcer was then made. Dissection  was carried down to bone. The soft tissue was elevated circumferentially  around both the spur of the base of the 1st metatarsal as well as the median  cuneiform spur. Then with a curved 1/4 inch osteotome this was then removed.  This was then flushed and rounded off with a rongeur.   The ulcer was then debrided down to bone with a rongeur. I then copiously  irrigated both the wound and the ulcer with normal saline. The tourniquet  was deflated. Hemostasis was achieved.   The skin was closed with 4-0 nylon. A sterile dressing was applied. A hard  sole shoe was applied. The patient was stable to the recovery room.  Leonides Grills, M.D.    PB/MEDQ  D:  02/25/2003  T:  02/25/2003  Job:  [id number]

## 2010-12-03 NOTE — Op Note (Signed)
NAME:  Phillip Kelley, Phillip Kelley                        ACCOUNT NO.:  [id number]   MEDICAL RECORD NO.:  [id number]                   PATIENT TYPE:  OIB   LOCATION:  2899                                 FACILITY:  [facility]   PHYSICIAN:  Stefani Dama, M.D.               DATE OF BIRTH:  [birth year]   DATE OF PROCEDURE:  11/05/2002  DATE OF DISCHARGE:                                 OPERATIVE REPORT   PREOPERATIVE DIAGNOSES:  1. Right carpal tunnel syndrome.  2. Right ganglion cyst of wrist.   POSTOPERATIVE DIAGNOSES:  1. Right carpal tunnel syndrome.  2. Right ganglion cyst of wrist.   OPERATION/PROCEDURE:  Right carpal tunnel release and right resection of  right ganglion cyst of the wrist.   ANESTHESIA:  IV sedation with anesthesia monitoring and local.   INDICATIONS FOR PROCEDURE:  The patient is a 77 year old individual who has  had significant difficulty with right carpal tunnel syndrome.  He was  evaluated for this some time ago and he has been followed clinically  however, he has been experiencing progressive dysfunction of the right hand  and wrist.  At evaluation, it was noted that he had a ganglion cyst in the  region of the right wrist.  I advised that we may want to have him evaluated  by a hand surgeon however, the patient indicated that he wished for me to  resect the cyst and take care of his carpal tunnel syndrome.   DESCRIPTION OF PROCEDURE:  The patient was brought to the operating room  supine on the stretcher.  After the smooth induction of some IV sedation,  the right hand was prepped.  A stockinette was applied and then the area of  the wrist crease was identified and a line from the thenar crease to the  fourth ray was drawn.  A small Z-plasty was created across the wrist crease  and then an incision was carried down just over the palmaris longus tendon.  The incision was then opened.  The subcutaneous tissues were cauterized and  then divided carefully and then  the dissection was carried down to the  transverse carpal ligament.  The ligament was opened and sectioned very  cautiously, identifying blanching and flattening of the median nerve right  at the apex of the ligament itself.  As the wrist crease was crossed, there  was noted to be a significant mass off to the right side that involved the  medial aspect of the palmaris longus tendon but also placed pressure on the  medial aspect of the median nerve as it entered into the carpal tunnel.  The  mass was then dissected carefully.  It appeared to be investing the flexor  carpi radialis and required resection from around the tendon sheath itself.  The dissection was carried from proximal to cephalad.  As three-quarters of  the cyst was excised, it ruptured.  The synovium was  collected.  The rest of  the cyst dissection was carried out towards the distal end.  In the end, the  cyst was resected from around the tendon sheath.  Cautery was used carefully  to make sure that the field was dry and then the incision was closed with 3-  0 Vicryl in the subcuticular tissues and 4-0 nylon in the skin.  The patient  tolerated the procedure well and was returned to the recovery room in stable  condition.                                               Stefani Dama, M.D.   Merla Riches  D:  11/05/2002  T:  11/05/2002  Job:  [id number]

## 2010-12-03 NOTE — Op Note (Signed)
NAME:  ALVAN, CULPEPPER NO.:  [id number]   MEDICAL RECORD NO.:  [id number]          PATIENT TYPE:  AMB   LOCATION:  ENDO                         FACILITY:  [facility]   PHYSICIAN:  Georgiana Spinner, M.D.    DATE OF BIRTH:  07/20/1926   DATE OF PROCEDURE:  DATE OF DISCHARGE:                                 OPERATIVE REPORT   PROCEDURE:  Colonoscopy.   INDICATIONS:  Hemoccult positivity.  History of colon polyps.   ANESTHESIA:  Fentanyl 50 mcg, Versed 5 mg.   PROCEDURE:  With the patient mildly sedated in the left lateral decubitus  position, the Olympus videoscopic colonoscope was inserted in the rectum  after a rectal examination was performed.  It passed under direct vision to  the cecum identified by ileocecal valve and appendiceal orifice both of  which were photographed.  From this point, the colonoscope was slowly  withdrawn taking circumferential views of colonic mucosa stopping to  photograph diverticula seen along the way mostly in the sigmoid colon until  we reached the rectum, which appeared normal and the rectum showed  hemorrhoids on retroflex view.  The endoscope was straightened and  withdrawn.  The patient's vital signs and pulse oximeter remained stable.  The patient tolerated procedure well without apparent complications.   FINDINGS:  Internal hemorrhoids, diverticulosis of sigmoid colon, much more  so than right colon.   PLAN:  Consider repeat examination in 5-10 years if clinically relevant.           ______________________________  Georgiana Spinner, M.D.     GMO/MEDQ  D:  02/13/2006  T:  02/13/2006  Job:  [id number]

## 2010-12-29 ENCOUNTER — Other Ambulatory Visit (HOSPITAL_COMMUNITY): Payer: Self-pay | Admitting: Family Medicine

## 2010-12-29 DIAGNOSIS — R609 Edema, unspecified: Secondary | ICD-10-CM

## 2010-12-30 ENCOUNTER — Other Ambulatory Visit (HOSPITAL_COMMUNITY): Payer: Self-pay | Admitting: Family Medicine

## 2010-12-30 ENCOUNTER — Ambulatory Visit (HOSPITAL_COMMUNITY): Payer: Medicare Other | Attending: Family Medicine | Admitting: Radiology

## 2010-12-30 DIAGNOSIS — I10 Essential (primary) hypertension: Secondary | ICD-10-CM | POA: Insufficient documentation

## 2010-12-30 DIAGNOSIS — I080 Rheumatic disorders of both mitral and aortic valves: Secondary | ICD-10-CM | POA: Insufficient documentation

## 2010-12-30 DIAGNOSIS — R0989 Other specified symptoms and signs involving the circulatory and respiratory systems: Secondary | ICD-10-CM | POA: Insufficient documentation

## 2010-12-30 DIAGNOSIS — R609 Edema, unspecified: Secondary | ICD-10-CM

## 2010-12-30 DIAGNOSIS — M7989 Other specified soft tissue disorders: Secondary | ICD-10-CM | POA: Insufficient documentation

## 2010-12-30 DIAGNOSIS — R0609 Other forms of dyspnea: Secondary | ICD-10-CM | POA: Insufficient documentation

## 2010-12-30 DIAGNOSIS — R600 Localized edema: Secondary | ICD-10-CM

## 2010-12-30 DIAGNOSIS — I379 Nonrheumatic pulmonary valve disorder, unspecified: Secondary | ICD-10-CM | POA: Insufficient documentation

## 2010-12-31 ENCOUNTER — Encounter (HOSPITAL_COMMUNITY): Payer: Self-pay

## 2011-01-04 ENCOUNTER — Telehealth: Payer: Self-pay | Admitting: *Deleted

## 2011-01-04 NOTE — Telephone Encounter (Signed)
Message copied by Burnell Blanks on Tue Jan 04, 2011  4:24 PM ------      Message from: Cassell Clement      Created: Fri Dec 31, 2010  1:02 PM       Results can be reported by his primary care physician.

## 2011-01-04 NOTE — Telephone Encounter (Signed)
Faxed results to Dr Kelby Fam as requested by Dr Patty Sermons

## 2011-01-06 ENCOUNTER — Telehealth: Payer: Self-pay | Admitting: *Deleted

## 2011-01-06 NOTE — Telephone Encounter (Signed)
Message copied by Burnell Blanks on Thu Jan 06, 2011 10:00 AM ------      Message from: Cassell Clement      Created: Fri Dec 31, 2010  1:02 PM       Results can be reported by his primary care physician.

## 2011-01-06 NOTE — Telephone Encounter (Signed)
Faxed results to PCP 

## 2011-03-31 ENCOUNTER — Ambulatory Visit (INDEPENDENT_AMBULATORY_CARE_PROVIDER_SITE_OTHER): Payer: Medicare Other | Admitting: Family Medicine

## 2011-03-31 ENCOUNTER — Encounter: Payer: Self-pay | Admitting: Family Medicine

## 2011-03-31 VITALS — BP 188/83 | HR 62 | Temp 98.0°F | Ht 67.0 in | Wt 150.5 lb

## 2011-03-31 DIAGNOSIS — M545 Low back pain: Secondary | ICD-10-CM

## 2011-03-31 DIAGNOSIS — R29818 Other symptoms and signs involving the nervous system: Secondary | ICD-10-CM

## 2011-03-31 DIAGNOSIS — K5900 Constipation, unspecified: Secondary | ICD-10-CM

## 2011-03-31 DIAGNOSIS — R269 Unspecified abnormalities of gait and mobility: Secondary | ICD-10-CM | POA: Insufficient documentation

## 2011-03-31 DIAGNOSIS — E559 Vitamin D deficiency, unspecified: Secondary | ICD-10-CM | POA: Insufficient documentation

## 2011-03-31 DIAGNOSIS — R2689 Other abnormalities of gait and mobility: Secondary | ICD-10-CM

## 2011-03-31 LAB — COMPREHENSIVE METABOLIC PANEL
CO2: 20 mEq/L (ref 19–32)
Calcium: 9.4 mg/dL (ref 8.4–10.5)
Chloride: 109 mEq/L (ref 96–112)
Creat: 1.21 mg/dL (ref 0.50–1.35)
Glucose, Bld: 92 mg/dL (ref 70–99)
Total Bilirubin: 0.4 mg/dL (ref 0.3–1.2)

## 2011-03-31 LAB — CBC
HCT: 36.7 % — ABNORMAL LOW (ref 39.0–52.0)
Hemoglobin: 12 g/dL — ABNORMAL LOW (ref 13.0–17.0)
MCH: 32.2 pg (ref 26.0–34.0)
MCV: 98.4 fL (ref 78.0–100.0)
RBC: 3.73 MIL/uL — ABNORMAL LOW (ref 4.22–5.81)
WBC: 13.4 10*3/uL — ABNORMAL HIGH (ref 4.0–10.5)

## 2011-03-31 MED ORDER — TRAMADOL-ACETAMINOPHEN 37.5-325 MG PO TABS: 1.0000 | ORAL_TABLET | Freq: Four times a day (QID) | ORAL | Status: AC | PRN

## 2011-03-31 MED ORDER — POLYETHYLENE GLYCOL 3350 17 GM/SCOOP PO POWD: 17.0000 g | Freq: Every day | ORAL | Status: DC

## 2011-03-31 NOTE — Assessment & Plan Note (Signed)
Polyeth glycol.

## 2011-03-31 NOTE — Assessment & Plan Note (Signed)
He reports being outside quit a bit, check for Vitamin D def.

## 2011-03-31 NOTE — Assessment & Plan Note (Signed)
Likely multifactorial, recommend treating and investigating reversible causes.

## 2011-03-31 NOTE — Assessment & Plan Note (Signed)
Patient is using NSAIDS exclusively, taking up to 4 doses a day. He reports pain control with tramadol but stopped using it because of constipation.  Discussed that we can add a osmotic laxative to treat this.  Recommended tramadol/acetaminopthen 37.5/325, one tid scheduled, with polyethylene glycol daily to bid. He may also use his naprosyn but less often, concern for renal failure and GI bleeding in the elderly.

## 2011-03-31 NOTE — Progress Notes (Signed)
Subjective:    Patient ID: Phillip Kelley, male    DOB: May 02, 1927, 77 y.o.   MRN: [id number]  HPI Referred to Dr. Sheffield Slider from Dr. Larita Fife to evaluate gait abnormality described as staggering and stumbling', frequent falls since 2009.  He currently uses a cane, has safety bars in the house.  Additionally he notices that last year he was able to walk a mile on a trail near his home, lately he has to stop 3 times taking the garbage to the curb.  His tolerance to exercise if limited both by low back pain that is significant and chronic and shortness of breath that seems to be new. His last fall was last week.   PMH:is significant for 1.  Left mastoidectomy in 1968 for what he thinks is mastoiditis, he had two surgeries in [facility] and has not been able to hear out of that ear since, he reports balance problems after the surgery that resolved. 2.  Left cataract surgery with complication and still has cloudiness and poor vision, right cataract needs to be done. 3.  Bilateral shoulder immobility secondary to rotator cuff syndrome. 4.  Persistent low back pain 5.  Cervical fusion in 2001 with complication of anhydrosis from neck down since 6.  Right Total hip replacement-with revision within one year 2000-2001 7. Carotid enterectomy (date unknown) for 90% stenosis  He is concerned about his memory, has to write things down.  He manages his finances, drives, and is independent in all IADLs and ADLs. He is married his wife is well and age 69.  He describes his past alcohol intake as moderate to heavy, he now has one glass of wine a day, sometimes 2.  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  He was a very active individual in the past, played tennis, golf, went skiing.  He denies depression but he angry that he has become so debilitated.   Back pain is not well controlled, uses NSAIDS mostly.  Reports tramadol effective in the  past but caused constipation and he could not live with that.  Some days every step hurts him.  His pain does increase with walking.  He does not have a lot of pain during the night and at rest. He sleeps well, eats a balanced healthy diet.    Review of Systems  Constitutional: Positive for activity change. Negative for appetite change and fatigue.       Weighed 215 in his day, now 150#.   Respiratory: Positive for shortness of breath.        With exertion  Cardiovascular: Positive for leg swelling. Negative for chest pain.  Genitourinary:       Nocturia X2  Musculoskeletal: Positive for back pain, arthralgias and gait problem. Negative for joint swelling.       Joint pain, back, shoulder, neck pain.   Neurological: Negative for dizziness, syncope, weakness, light-headedness and numbness.  Hematological: Bruises/bleeds easily.  Psychiatric/Behavioral: Negative for confusion and dysphoric mood.       Objective:   Physical Exam  Constitutional: He is oriented to person, place, and time.       Alert, oriented, frail 77 year old male.  HENT:  Right Ear: External ear normal.  Mouth/Throat: Oropharynx is clear and moist.       Right ear, no hearing, unable to see any bony structures or a normal TM.  Eyes: EOM are  normal.  Neck:       Head forward position, marked reduction in ROM in all directions.  Cardiovascular: Normal rate, regular rhythm and normal heart sounds.   Pulmonary/Chest: Effort normal and breath sounds normal.  Musculoskeletal: He exhibits edema.       Neck marked reduction in ROM Shoulders only can extend to 45 degrees, bilaterally Atrophy of interossei muscles Cervical spine kyphotic Hip-decreased internal rotation on the left, right externally rotated Knees- valgus deformity Stooped stance 1 + ankle edema bilaterally    Lymphadenopathy:    He has no cervical adenopathy.  Neurological: He is alert and oriented to person, place, and time. No cranial nerve  deficit. He exhibits normal muscle tone. Coordination abnormal.       Abnormal romberg Required stand by guard during gait testing Gait not wide based LE:  proximal 4/5 symmetrical with difficulty going from sit to stand.   Sensory testing of feet: decreased sensation to touch, position sense intact  Skin:       Skin tears and scabs on shins, dry aging changes  Psychiatric: He has a normal mood and affect. His behavior is normal. Judgment and thought content normal.      Balance Abnormal Patient value  Sitting balance    Arise x [place] to stand without using arms  Attempts to arise x Postural movements normal  Immediate standing balance    Standing balance x Very kyphotic with head forward  Neck rotation x Very limited  Eyes closed x Romberg mildly unsteady  360 degree turn x Discontinuous  Sitting down x Uses arms   Gait Abnormal Patient value  Initiation of gait    Step length-left x Short steps,   Step length-right x Turns R foot out  Step height-left    Step height-right    Step symmetry    Step continuity    Path x Staggers backwards at times  Trunk    Walking stance            Assessment & Plan:

## 2011-03-31 NOTE — Patient Instructions (Signed)
We will send the full report to Dr. Samella Parr should expect to get the labs results in the mail as well It was very nice meeting you If Tramadol worked for you pain, but caused constipation:  You can resume the tramadol and take a laxitive once or twice a day Naprosyn has plenty of side effects, the risk of GI bleeding and an effect on your kidneys    We have prescribed a combination pill that includes tramadol and acetaminophen, you may take 3 every 8 house as needed.  For your bowels I have prescribed polyethylene glycol for your bowels We believe your balance problems are multifactorial, Dr. Sheffield Slider will likely recommend MRI of cervical and lumbar spine.

## 2011-03-31 NOTE — Assessment & Plan Note (Addendum)
Suspect there is no one reversible cause.  Cervical and Lumbar spinal stenosis likely present, with advanced DJD of shoulders, hands, hips, knees. Recommend MRI of cervical and lumbar spine.  Improved pain control (see low back pain). Check Vitamin B12 and Vitamin D.

## 2011-04-01 ENCOUNTER — Telehealth: Payer: Self-pay | Admitting: Family Medicine

## 2011-04-01 NOTE — Telephone Encounter (Signed)
Pt returned Dr Martin Majestic call and has to run out in about 30 min.

## 2011-04-03 ENCOUNTER — Encounter: Payer: Self-pay | Admitting: Family Medicine

## 2011-06-02 ENCOUNTER — Encounter: Payer: Self-pay | Admitting: Internal Medicine

## 2011-10-14 ENCOUNTER — Ambulatory Visit: Payer: Medicare Other

## 2011-10-14 ENCOUNTER — Other Ambulatory Visit: Payer: Medicare Other

## 2011-10-20 ENCOUNTER — Encounter: Payer: Self-pay | Admitting: Neurosurgery

## 2011-10-21 ENCOUNTER — Encounter: Payer: Self-pay | Admitting: Neurosurgery

## 2011-10-21 ENCOUNTER — Ambulatory Visit (INDEPENDENT_AMBULATORY_CARE_PROVIDER_SITE_OTHER): Payer: Medicare Other | Admitting: Neurosurgery

## 2011-10-21 ENCOUNTER — Ambulatory Visit (INDEPENDENT_AMBULATORY_CARE_PROVIDER_SITE_OTHER): Payer: Medicare Other | Admitting: *Deleted

## 2011-10-21 VITALS — BP 128/63 | HR 69 | Resp 16 | Ht 67.0 in | Wt 158.0 lb

## 2011-10-21 DIAGNOSIS — I6529 Occlusion and stenosis of unspecified carotid artery: Secondary | ICD-10-CM

## 2011-10-21 DIAGNOSIS — Z48812 Encounter for surgical aftercare following surgery on the circulatory system: Secondary | ICD-10-CM

## 2011-10-21 NOTE — Progress Notes (Signed)
[facility] [place] HISTORY AND PHYSICAL   CC: Six-month carotid duplex Referring Physician: Hart Rochester  History of Present Illness: This is an 78 year old patient of Dr. Candie Chroman is status post left CEA February 2010. Patient reports no difficulties since that time. He states he is always generally weak but this is no change from his baseline. Patient reports no signs of amaurosis fugax, TIA, CVA, dysphagia, diplopia or word finding difficulties.  Past Medical History  Diagnosis Date  . Arthritis   . Cataract   . Hypertension     ROS: [x]  Positive   [ ]  Denies    General: [ ]  Weight loss, [ ]  Fever, [ ]  chills Neurologic: [ ]  Dizziness, [ ]  Blackouts, [ ]  Seizure [ ]  Stroke, [ ]  "Mini stroke", [ ]  Slurred speech, [ ]  Temporary blindness; [ ]  weakness in arms or legs, [ ]  Hoarseness Cardiac: [ ]  Chest pain/pressure, [ ]  Shortness of breath at rest [ ]  Shortness of breath with exertion, [ ]  Atrial fibrillation or irregular heartbeat Vascular: [ ]  Pain in legs with walking, [ ]  Pain in legs at rest, [ ]  Pain in legs at night,  [ ]  Non-healing ulcer, [ ]  Blood clot in vein/DVT,   Pulmonary: [ ]  Home oxygen, [ ]  Productive cough, [ ]  Coughing up blood, [ ]  Asthma,  [ ]  Wheezing Musculoskeletal:  [ ]  Arthritis, [ ]  Low back pain, [ ]  Joint pain Hematologic: [ ]  Easy Bruising, [ ]  Anemia; [ ]  Hepatitis Gastrointestinal: [ ]  Blood in stool, [ ]  Gastroesophageal Reflux/heartburn, [ ]  Trouble swallowing Urinary: [ ]  chronic Kidney disease, [ ]  on HD - [ ]  MWF or [ ]  TTHS, [ ]  Burning with urination, [ ]  Difficulty urinating Skin: [ ]  Rashes, [ ]  Wounds Psychological: [ ]  Anxiety, [ ]  Depression   Social History History  Substance Use Topics  . Smoking status: Former Smoker    Types: Cigarettes  . Smokeless tobacco: Former Neurosurgeon    Quit date: 07/18/1977  . Alcohol Use: 4.2 oz/week    7 Glasses of wine per week    Family History No family history on  file.  Allergies  Allergen Reactions  . Penicillins   . Sulfa Antibiotics     Current Outpatient Prescriptions  Medication Sig Dispense Refill  . clopidogrel (PLAVIX) 75 MG tablet Take 75 mg by mouth daily.      Marland Kitchen aspirin 81 MG tablet Take 81 mg by mouth daily.        . Calcium Carbonate-Vitamin D (CALCIUM-VITAMIN D) 500-200 MG-UNIT per tablet Take 1 tablet by mouth 2 (two) times daily with a meal.        . losartan-hydrochlorothiazide (HYZAAR) 100-25 MG per tablet Take 1 tablet by mouth daily.        . naproxen sodium (ANAPROX) 220 MG tablet Take 220 mg by mouth 2 (two) times daily with a meal.        . polyethylene glycol powder (GLYCOLAX) powder Take 17 g by mouth daily.  255 g  1  . pravastatin (PRAVACHOL) 40 MG tablet       . Tamsulosin HCl (FLOMAX) 0.4 MG CAPS Take by mouth.          Physical Examination  Filed Vitals:   10/21/11 1444  BP: 128/63  Pulse: 69  Resp: 16    Body mass index is 24.75 kg/(m^2).  General:  WDWN in NAD Gait: Normal HEENT: WNL Eyes: Pupils equal Pulmonary:  normal non-labored breathing , without Rales, rhonchi,  wheezing Cardiac: RRR, without  Murmurs, rubs or gallops; Abdomen: soft, NT, no masses Skin: no rashes, ulcers noted  Vascular Exam Pulses: Patient has 2+ radial pulses bilaterally Carotid bruits are present and faint on the right negative on the left Extremities without ischemic changes, no Gangrene , no cellulitis; no open wounds;  Musculoskeletal: no muscle wasting or atrophy   Neurologic: A&O X 3; Appropriate Affect ; SENSATION: normal; MOTOR FUNCTION:  moving all extremities equally. Speech is fluent/normal  Non-Invasive Vascular Imaging CAROTID DUPLEX 10/21/2011  Right ICA 60 - 79 % stenosis Left ICA 20 - 39 % stenosis   ASSESSMENT/PLAN: Given the fact the patient is asymptomatic and has had no signs of stroke we will bring him back in 6 months for repeat carotid duplex and followup with Dr. Hart Rochester. Right now his stenosis  is borderline at 60-79% on the right side and this needs to be monitored closely. His questions were encouraged and answered using agreement with this plan.  [facility] ANP  Clinic M.D.: Imogene Burn

## 2011-10-24 NOTE — Procedures (Unsigned)
CAROTID DUPLEX EXAM  INDICATION:  Follow up left CEA.  HISTORY: Diabetes:  No. Cardiac:  No. Hypertension:  Yes. Smoking:  Previous. Previous Surgery:  Left CEA, 08/27/08. CV History: Amaurosis Fugax No, Paresthesias No, Hemiparesis No.                                      RIGHT             LEFT Brachial systolic pressure:         122               120 Brachial Doppler waveforms:         WNL               WNL Vertebral direction of flow:        Antegrade         Antegrade DUPLEX VELOCITIES (cm/sec) CCA peak systolic                   81                78 ECA peak systolic                   245               249 ICA peak systolic                   240               71 ICA end diastolic                   60                19 PLAQUE MORPHOLOGY:                  Heterogenous      Heterogenous PLAQUE AMOUNT:                      Moderate          Moderate PLAQUE LOCATION:                    ICA/ECA           ECA  IMPRESSION: 1. Borderline 60% to 79% right internal carotid artery stenosis. 2. Widely patent left carotid endarterectomy without evidence of     restenosis. 3. Bilateral external carotid artery stenosis is observed. 4. Bilateral vertebral arteries are within normal limits.  ___________________________________________ Quita Skye Hart Rochester, M.D.  LT/MEDQ  D:  10/21/2011  T:  10/21/2011  Job:  [id number]

## 2011-11-11 ENCOUNTER — Other Ambulatory Visit: Payer: Self-pay | Admitting: Cardiology

## 2011-11-11 ENCOUNTER — Ambulatory Visit (HOSPITAL_COMMUNITY)
Admission: RE | Admit: 2011-11-11 | Discharge: 2011-11-11 | Disposition: A | Discharge status: Home / Self Care | Payer: Medicare Other | Source: Ambulatory Visit | Attending: Cardiology | Admitting: Cardiology

## 2011-11-11 DIAGNOSIS — I059 Rheumatic mitral valve disease, unspecified: Secondary | ICD-10-CM | POA: Insufficient documentation

## 2011-11-11 DIAGNOSIS — R0989 Other specified symptoms and signs involving the circulatory and respiratory systems: Secondary | ICD-10-CM | POA: Insufficient documentation

## 2011-11-11 DIAGNOSIS — R609 Edema, unspecified: Secondary | ICD-10-CM

## 2011-11-11 DIAGNOSIS — I359 Nonrheumatic aortic valve disorder, unspecified: Secondary | ICD-10-CM | POA: Insufficient documentation

## 2011-11-11 DIAGNOSIS — R0609 Other forms of dyspnea: Secondary | ICD-10-CM | POA: Insufficient documentation

## 2011-11-11 DIAGNOSIS — R0600 Dyspnea, unspecified: Secondary | ICD-10-CM

## 2011-11-11 DIAGNOSIS — R0602 Shortness of breath: Secondary | ICD-10-CM

## 2011-11-11 NOTE — Progress Notes (Signed)
  Echocardiogram 2D Echocardiogram has been performed.  Phillip Kelley 11/11/2011, 11:31 AM

## 2011-12-21 ENCOUNTER — Encounter: Payer: Self-pay | Admitting: Internal Medicine

## 2011-12-21 ENCOUNTER — Ambulatory Visit (INDEPENDENT_AMBULATORY_CARE_PROVIDER_SITE_OTHER): Payer: Medicare Other | Admitting: Internal Medicine

## 2011-12-21 VITALS — BP 174/80 | HR 64 | Ht 67.0 in | Wt 152.1 lb

## 2011-12-21 DIAGNOSIS — I5022 Chronic systolic (congestive) heart failure: Secondary | ICD-10-CM

## 2011-12-21 NOTE — Patient Instructions (Signed)
Your physician has recommended that you have a pacemaker inserted. A pacemaker is a small device that is placed under the skin of your chest or abdomen to help control abnormal heart rhythms. This device uses electrical pulses to prompt the heart to beat at a normal rate. Pacemakers are used to treat heart rhythms that are too slow. Wire (leads) are attached to the pacemaker that goes into the chambers of you heart. This is done in the hospital and usually requires and overnight stay. Please see the instruction sheet given to you today for more information.  Dennis Bast, RN, Dr. Lubertha Basque nurse, will be in touch with you later next week with the details of your procedure.

## 2011-12-21 NOTE — Assessment & Plan Note (Signed)
The patient has chronic systolic heart failure with left bundle branch block and a QRS duration of 160 ms in the setting of intermittent 2:1 heart block with an ejection fraction of 40%. I discussed the treatment options with the patient. The risk, goals, benefits, and expectations of biventricular pacemaker insertion have been discussed with the patient and he wishes to proceed. This will be scheduled in the next several weeks. My anticipation would be that once his device is placed, up titration of his beta blocker will be initiated.

## 2011-12-21 NOTE — Progress Notes (Signed)
HPI Phillip Kelley is referred today by Dr. Jacinto Halim for evaluation of symptomatic bradycardia and chronic systolic heart failure. The patient is a very pleasant 78 year old man who over the last 5 or 6 months has had progressive dyspnea, weakness, and fatigue. He now has class III heart failure symptoms. After demonstrating an ejection fraction of 40%, the patient was placed on carvedilol 3.125 mg twice daily and developed profound bradycardia with 2:1 heart block and a ventricular rate of 30 beats per minute. His beta blocker was discontinued. He has never had syncope. He denies chest pressure. A stress test demonstrated no ischemia. Allergies  Allergen Reactions  . Penicillins   . Sulfa Antibiotics      Current Outpatient Prescriptions  Medication Sig Dispense Refill  . aspirin 81 MG tablet Take 81 mg by mouth daily.        . Calcium Carbonate-Vitamin D (CALCIUM-VITAMIN D) 500-200 MG-UNIT per tablet Take 1 tablet by mouth 2 (two) times daily with a meal.        . furosemide (LASIX) 20 MG tablet Take 20 mg by mouth daily.       . hydrALAZINE (APRESOLINE) 25 MG tablet Take 25 mg by mouth 3 (three) times daily.       . naproxen sodium (ANAPROX) 220 MG tablet Take 220 mg by mouth as needed.       . pravastatin (PRAVACHOL) 40 MG tablet Take 40 mg by mouth daily.       . Tamsulosin HCl (FLOMAX) 0.4 MG CAPS Take 0.8 mg by mouth daily.          Past Medical History  Diagnosis Date  . Arthritis   . Cataract   . Hypertension     ROS:   All systems reviewed and negative except as noted in the HPI.   Past Surgical History  Procedure Date  . Anterior fusion cervical spine 2001    for numbness in right arm, left with anhydrosis  . Total hip arthroplasty 2001    fell  . Carotid endarterectomy   . Carpal tunnel release     and ganglion cyst excision     No family history on file.   History   Social History  . Marital Status: Married    Spouse Name: N/A    Number of Children: N/A  .  Years of Education: N/A   Occupational History  . Not on file.   Social History Main Topics  . Smoking status: Former Smoker    Types: Cigarettes  . Smokeless tobacco: Former Neurosurgeon    Quit date: 07/18/1977  . Alcohol Use: 4.2 oz/week    7 Glasses of wine per week  . Drug Use: Not on file  . Sexually Active: Not on file   Other Topics Concern  . Not on file   Social History Narrative  . No narrative on file     BP 174/80  Pulse 64  Ht 5\' 7"  (1.702 m)  Wt 152 lb 1.9 oz (69.001 kg)  BMI 23.83 kg/m2  Physical Exam:  Well appearing elderly man, NAD HEENT: Unremarkable Neck:  7 cm JVD, no thyromegally Lungs:  Clear with no wheezes or rhonchi. Basilar rales are present bilaterally. HEART:  Regular rate rhythm, no murmurs, no rubs, no clicks. S2 split. Abd:  soft, positive bowel sounds, no organomegally, no rebound, no guarding Ext:  2 plus pulses, no edema, no cyanosis, no clubbing Skin:  No rashes no nodules Neuro:  CN II through XII  intact, motor grossly intact  EKG Normal sinus rhythm with left bundle branch block and 2:1 heart block  Assess/Plan:

## 2011-12-28 ENCOUNTER — Other Ambulatory Visit: Payer: Self-pay | Admitting: *Deleted

## 2011-12-28 ENCOUNTER — Encounter: Payer: Self-pay | Admitting: *Deleted

## 2011-12-28 DIAGNOSIS — I4460 Unspecified fascicular block: Secondary | ICD-10-CM

## 2011-12-28 DIAGNOSIS — I5022 Chronic systolic (congestive) heart failure: Secondary | ICD-10-CM

## 2011-12-29 ENCOUNTER — Telehealth: Payer: Self-pay | Admitting: Internal Medicine

## 2011-12-29 NOTE — Telephone Encounter (Signed)
Fu call °Patient returning your call °

## 2011-12-29 NOTE — Telephone Encounter (Signed)
Spoke with patient and gave him instructions and let him know i have mailed it to him as well

## 2012-01-02 ENCOUNTER — Telehealth: Payer: Self-pay | Admitting: Internal Medicine

## 2012-01-02 NOTE — Telephone Encounter (Signed)
New msg Patient has questions about what to do before procedure. Please call

## 2012-01-02 NOTE — Telephone Encounter (Signed)
Called patient and went over all the procedure

## 2012-01-03 ENCOUNTER — Telehealth: Payer: Self-pay | Admitting: Internal Medicine

## 2012-01-03 NOTE — Telephone Encounter (Signed)
Please mail information, Pamplets, booklets to patients to read  Concerning upcoming pacer surgery.

## 2012-01-03 NOTE — Telephone Encounter (Signed)
Mailed booklet to patient.

## 2012-01-05 ENCOUNTER — Other Ambulatory Visit: Payer: Self-pay | Admitting: *Deleted

## 2012-01-05 DIAGNOSIS — I509 Heart failure, unspecified: Secondary | ICD-10-CM

## 2012-01-10 ENCOUNTER — Telehealth: Payer: Self-pay | Admitting: Internal Medicine

## 2012-01-10 NOTE — Telephone Encounter (Signed)
This was mailed from the [address] office on 01/04/12  Pt aware

## 2012-01-10 NOTE — Telephone Encounter (Signed)
New problem:    Patient called in because he never received the Implant information packet that you said would be sent.  Please call back to confirm that his information will be sent out.

## 2012-01-12 ENCOUNTER — Encounter (HOSPITAL_COMMUNITY): Payer: Self-pay | Admitting: Pharmacy Technician

## 2012-01-13 ENCOUNTER — Telehealth: Payer: Self-pay | Admitting: Internal Medicine

## 2012-01-13 NOTE — Telephone Encounter (Signed)
Follow-up:    Patient is calling back claiming that his literature never arrived for the past two weeks.  Please call back.

## 2012-01-13 NOTE — Telephone Encounter (Signed)
He has never gotten info in mail that I sent to him on 6/18  So I am leaving out front for him to pick up next week when he comes in for labs

## 2012-01-18 ENCOUNTER — Other Ambulatory Visit (INDEPENDENT_AMBULATORY_CARE_PROVIDER_SITE_OTHER): Payer: Medicare Other

## 2012-01-18 DIAGNOSIS — I4460 Unspecified fascicular block: Secondary | ICD-10-CM

## 2012-01-18 DIAGNOSIS — I5022 Chronic systolic (congestive) heart failure: Secondary | ICD-10-CM

## 2012-01-18 LAB — CBC WITH DIFFERENTIAL/PLATELET
Basophils Absolute: 0.1 10*3/uL (ref 0.0–0.1)
Eosinophils Absolute: 0.3 10*3/uL (ref 0.0–0.7)
HCT: 34.1 % — ABNORMAL LOW (ref 39.0–52.0)
Lymphs Abs: 0.9 10*3/uL (ref 0.7–4.0)
MCHC: 33 g/dL (ref 30.0–36.0)
MCV: 96.7 fl (ref 78.0–100.0)
Monocytes Absolute: 1 10*3/uL (ref 0.1–1.0)
Neutrophils Relative %: 70.5 % (ref 43.0–77.0)
Platelets: 155 10*3/uL (ref 150.0–400.0)
RDW: 15.2 % — ABNORMAL HIGH (ref 11.5–14.6)
WBC: 7.7 10*3/uL (ref 4.5–10.5)

## 2012-01-18 LAB — BASIC METABOLIC PANEL
BUN: 32 mg/dL — ABNORMAL HIGH (ref 6–23)
CO2: 25 mEq/L (ref 19–32)
Chloride: 110 mEq/L (ref 96–112)
Creatinine, Ser: 1.2 mg/dL (ref 0.4–1.5)
Glucose, Bld: 90 mg/dL (ref 70–99)

## 2012-01-18 LAB — PROTIME-INR
INR: 1.1 ratio — ABNORMAL HIGH (ref 0.8–1.0)
Prothrombin Time: 12.1 s (ref 10.2–12.4)

## 2012-01-23 ENCOUNTER — Telehealth: Payer: Self-pay | Admitting: Internal Medicine

## 2012-01-23 NOTE — Telephone Encounter (Signed)
PT CALLED WITH  QUESTION IF  COULD TAKE ASA AM OF PROCEDURE WAS TOLD TO ONLY  HOLD LASIX  PT AWARE MAY TAKE ASA  AS WELL AS OTHER AM MEDS ONLY NEEDS TO HOLD LASIX PT VERBALIZED UNDERSTANDING./CY

## 2012-01-23 NOTE — Telephone Encounter (Signed)
New msg Pt is having pacemaker put in on wed. He had a few questions about his meds

## 2012-01-24 MED ORDER — VANCOMYCIN HCL IN DEXTROSE 1-5 GM/200ML-% IV SOLN
1000.0000 mg | INTRAVENOUS | Status: DC
  Filled 2012-01-24: qty 200

## 2012-01-24 MED ORDER — SODIUM CHLORIDE 0.9 % IR SOLN
80.0000 mg | Status: DC
  Filled 2012-01-24: qty 2

## 2012-01-25 ENCOUNTER — Ambulatory Visit (HOSPITAL_COMMUNITY)
Admission: RE | Admit: 2012-01-25 | Discharge: 2012-01-26 | Disposition: A | Discharge status: Home / Self Care | Payer: Medicare Other | Source: Ambulatory Visit | Attending: Internal Medicine | Admitting: Internal Medicine

## 2012-01-25 ENCOUNTER — Encounter (HOSPITAL_COMMUNITY)
Admission: RE | Disposition: A | Discharge status: Home / Self Care | Payer: Self-pay | Source: Ambulatory Visit | Attending: Internal Medicine

## 2012-01-25 ENCOUNTER — Encounter (HOSPITAL_COMMUNITY): Payer: Self-pay | Admitting: General Practice

## 2012-01-25 DIAGNOSIS — I5022 Chronic systolic (congestive) heart failure: Secondary | ICD-10-CM

## 2012-01-25 DIAGNOSIS — I428 Other cardiomyopathies: Secondary | ICD-10-CM | POA: Insufficient documentation

## 2012-01-25 DIAGNOSIS — I10 Essential (primary) hypertension: Secondary | ICD-10-CM | POA: Insufficient documentation

## 2012-01-25 DIAGNOSIS — D649 Anemia, unspecified: Secondary | ICD-10-CM | POA: Insufficient documentation

## 2012-01-25 DIAGNOSIS — I498 Other specified cardiac arrhythmias: Secondary | ICD-10-CM | POA: Insufficient documentation

## 2012-01-25 DIAGNOSIS — Z96649 Presence of unspecified artificial hip joint: Secondary | ICD-10-CM | POA: Insufficient documentation

## 2012-01-25 DIAGNOSIS — I447 Left bundle-branch block, unspecified: Secondary | ICD-10-CM | POA: Insufficient documentation

## 2012-01-25 DIAGNOSIS — I509 Heart failure, unspecified: Secondary | ICD-10-CM | POA: Insufficient documentation

## 2012-01-25 DIAGNOSIS — Z950 Presence of cardiac pacemaker: Secondary | ICD-10-CM

## 2012-01-25 HISTORY — DX: Other chronic pain: G89.29

## 2012-01-25 HISTORY — DX: Anemia, unspecified: D64.9

## 2012-01-25 HISTORY — DX: Low back pain, unspecified: M54.50

## 2012-01-25 HISTORY — DX: Personal history of other medical treatment: Z92.89

## 2012-01-25 HISTORY — PX: INSERT / REPLACE / REMOVE PACEMAKER: SUR710

## 2012-01-25 HISTORY — DX: Low back pain: M54.5

## 2012-01-25 HISTORY — PX: BI-VENTRICULAR PACEMAKER INSERTION: SHX5462

## 2012-01-25 HISTORY — DX: Bradycardia, unspecified: R00.1

## 2012-01-25 SURGERY — BI-VENTRICULAR PACEMAKER INSERTION (CRT-P)
Anesthesia: LOCAL

## 2012-01-25 MED ORDER — VANCOMYCIN HCL 1000 MG IV SOLR
1500.0000 mg | Freq: Two times a day (BID) | INTRAVENOUS | Status: AC
  Administered 2012-01-26: 1500 mg via INTRAVENOUS
  Filled 2012-01-25: qty 1500

## 2012-01-25 MED ORDER — ONDANSETRON HCL 4 MG/2ML IJ SOLN: 4.0000 mg | Freq: Four times a day (QID) | INTRAMUSCULAR | Status: DC | PRN

## 2012-01-25 MED ORDER — METOPROLOL TARTRATE 1 MG/ML IV SOLN
INTRAVENOUS | Status: AC
  Filled 2012-01-25: qty 5

## 2012-01-25 MED ORDER — MUPIROCIN 2 % EX OINT
TOPICAL_OINTMENT | Freq: Two times a day (BID) | CUTANEOUS | Status: DC
Start: 2012-01-25 — End: 2012-01-25
  Administered 2012-01-25 (×2): via NASAL
  Filled 2012-01-25: qty 22

## 2012-01-25 MED ORDER — HYDRALAZINE HCL 25 MG PO TABS
25.0000 mg | ORAL_TABLET | Freq: Three times a day (TID) | ORAL | Status: DC
  Administered 2012-01-26 (×2): 25 mg via ORAL
  Filled 2012-01-25 (×6): qty 1

## 2012-01-25 MED ORDER — VANCOMYCIN HCL 1000 MG IV SOLR
1500.0000 mg | Freq: Two times a day (BID) | INTRAVENOUS | Status: DC
  Filled 2012-01-25: qty 1500

## 2012-01-25 MED ORDER — MIDAZOLAM HCL 5 MG/5ML IJ SOLN
INTRAMUSCULAR | Status: AC
  Filled 2012-01-25: qty 5

## 2012-01-25 MED ORDER — SODIUM CHLORIDE 0.9 % IV SOLN: 250.0000 mL | INTRAVENOUS | Status: DC

## 2012-01-25 MED ORDER — TAMSULOSIN HCL 0.4 MG PO CAPS
0.4000 mg | ORAL_CAPSULE | Freq: Two times a day (BID) | ORAL | Status: DC
  Administered 2012-01-25 – 2012-01-26 (×2): 0.4 mg via ORAL
  Filled 2012-01-25 (×3): qty 1

## 2012-01-25 MED ORDER — SODIUM CHLORIDE 0.45 % IV SOLN
INTRAVENOUS | Status: DC
  Administered 2012-01-25: 1000 mL via INTRAVENOUS

## 2012-01-25 MED ORDER — LIDOCAINE HCL (PF) 1 % IJ SOLN
INTRAMUSCULAR | Status: AC
  Filled 2012-01-25: qty 60

## 2012-01-25 MED ORDER — FENTANYL CITRATE 0.05 MG/ML IJ SOLN
INTRAMUSCULAR | Status: AC
  Filled 2012-01-25: qty 2

## 2012-01-25 MED ORDER — ACETAMINOPHEN 325 MG PO TABS: 325.0000 mg | ORAL_TABLET | ORAL | Status: DC | PRN

## 2012-01-25 MED ORDER — SODIUM CHLORIDE 0.9 % IJ SOLN: 3.0000 mL | Freq: Two times a day (BID) | INTRAMUSCULAR | Status: DC

## 2012-01-25 MED ORDER — MUPIROCIN 2 % EX OINT
TOPICAL_OINTMENT | CUTANEOUS | Status: AC
  Filled 2012-01-25: qty 22

## 2012-01-25 MED ORDER — HYDRALAZINE HCL 20 MG/ML IJ SOLN
INTRAMUSCULAR | Status: AC
  Filled 2012-01-25: qty 1

## 2012-01-25 MED ORDER — FUROSEMIDE 20 MG PO TABS
20.0000 mg | ORAL_TABLET | Freq: Every day | ORAL | Status: DC
  Administered 2012-01-25 – 2012-01-26 (×2): 20 mg via ORAL
  Filled 2012-01-25 (×2): qty 1

## 2012-01-25 MED ORDER — SODIUM CHLORIDE 0.9 % IJ SOLN: 3.0000 mL | INTRAMUSCULAR | Status: DC | PRN

## 2012-01-25 MED ORDER — CHLORHEXIDINE GLUCONATE 4 % EX LIQD
60.0000 mL | Freq: Once | CUTANEOUS | Status: DC
  Filled 2012-01-25: qty 60

## 2012-01-25 MED ORDER — SIMVASTATIN 5 MG PO TABS
5.0000 mg | ORAL_TABLET | Freq: Every day | ORAL | Status: DC
  Administered 2012-01-25: 5 mg via ORAL
  Filled 2012-01-25 (×2): qty 1

## 2012-01-25 NOTE — Progress Notes (Signed)
Orthopedic Tech Progress Note Patient Details:  Phillip Kelley 21-Oct-1926 [id number]  Ortho Devices Type of Ortho Device: Arm foam sling Ortho Device/Splint Location: (L) UE Ortho Device/Splint Interventions: Application   Jennye Moccasin 01/25/2012, 4:14 PM

## 2012-01-25 NOTE — H&P (Signed)
HPI  [name] is admitted today for evaluation of symptomatic bradycardia and chronic systolic heart failure and BiV PPM implant. The patient is a very pleasant 78 year old man who over the last 5 or 6 months has had progressive dyspnea, weakness, and fatigue. He now has class III heart failure symptoms. After demonstrating an ejection fraction of 40%, the patient was placed on carvedilol 3.125 mg twice daily and developed profound bradycardia with 2:1 heart block and a ventricular rate of 30 beats per minute. His beta blocker was discontinued. He has never had syncope. He denies chest pressure. A stress test demonstrated no ischemia.  Allergies   Allergen  Reactions   .  Penicillins    .  Sulfa Antibiotics     Current Outpatient Prescriptions   Medication  Sig  Dispense  Refill   .  aspirin 81 MG tablet  Take 81 mg by mouth daily.     .  Calcium Carbonate-Vitamin D (CALCIUM-VITAMIN D) 500-200 MG-UNIT per tablet  Take 1 tablet by mouth 2 (two) times daily with a meal.     .  furosemide (LASIX) 20 MG tablet  Take 20 mg by mouth daily.     .  hydrALAZINE (APRESOLINE) 25 MG tablet  Take 25 mg by mouth 3 (three) times daily.     .  naproxen sodium (ANAPROX) 220 MG tablet  Take 220 mg by mouth as needed.     .  pravastatin (PRAVACHOL) 40 MG tablet  Take 40 mg by mouth daily.     .  Tamsulosin HCl (FLOMAX) 0.4 MG CAPS  Take 0.8 mg by mouth daily.      Past Medical History   Diagnosis  Date   .  Arthritis    .  Cataract    .  Hypertension     ROS:  All systems reviewed and negative except as noted in the HPI.  Past Surgical History   Procedure  Date   .  Anterior fusion cervical spine  2001     for numbness in right arm, left with anhydrosis   .  Total hip arthroplasty  2001     fell   .  Carotid endarterectomy    .  Carpal tunnel release      and ganglion cyst excision    No family history on file.  History    Social History   .  Marital Status:  Married     Spouse Name:  N/A       Number of Children:  N/A   .  Years of Education:  N/A    Occupational History   .  Not on file.    Social History Main Topics   .  Smoking status:  Former Smoker     Types:  Cigarettes   .  Smokeless tobacco:  Former Neurosurgeon     Quit date:  07/18/1977   .  Alcohol Use:  4.2 oz/week     7 Glasses of wine per week   .  Drug Use:  Not on file   .  Sexually Active:  Not on file    Other Topics  Concern   .  Not on file    Social History Narrative   .  No narrative on file    BP 174/80  Pulse 64  Ht 5\' 7"  (1.702 m)  Wt 152 lb 1.9 oz (69.001 kg)  BMI 23.83 kg/m2  Physical Exam:  Well appearing elderly man, NAD  HEENT: Unremarkable  Neck: 7 cm JVD, no thyromegally  Lungs: Clear with no wheezes or rhonchi. Basilar rales are present bilaterally.  HEART: Regular rate rhythm, no murmurs, no rubs, no clicks. S2 split.  Abd: soft, positive bowel sounds, no organomegally, no rebound, no guarding  Ext: 2 plus pulses, no edema, no cyanosis, no clubbing  Skin: No rashes no nodules  Neuro: CN II through XII intact, motor grossly intact  EKG  Normal sinus rhythm with left bundle branch block and 2:1 heart block  Assess/Plan:   Chronic systolic heart failure - Lewayne Bunting, MD 12/21/2011 1:52 PM Signed  The patient has chronic systolic heart failure with left bundle branch block and a QRS duration of 160 ms in the setting of intermittent 2:1 heart block with an ejection fraction of 40%. I discussed the treatment options with the patient. The risk, goals, benefits, and expectations of biventricular pacemaker insertion have been discussed with the patient and he wishes to proceed.    Lewayne Bunting, M.D.

## 2012-01-25 NOTE — Op Note (Signed)
BiV PPM inserted via the left subclavian vein without immediate complication.D# [id number].

## 2012-01-26 ENCOUNTER — Encounter: Payer: Self-pay | Admitting: *Deleted

## 2012-01-26 ENCOUNTER — Encounter (HOSPITAL_COMMUNITY): Payer: Self-pay | Admitting: Cardiology

## 2012-01-26 ENCOUNTER — Ambulatory Visit (HOSPITAL_COMMUNITY): Payer: Medicare Other

## 2012-01-26 DIAGNOSIS — Z950 Presence of cardiac pacemaker: Secondary | ICD-10-CM | POA: Insufficient documentation

## 2012-01-26 MED ORDER — ACETAMINOPHEN 325 MG PO TABS: 325.0000 mg | ORAL_TABLET | Freq: Four times a day (QID) | ORAL | Status: DC | PRN

## 2012-01-26 NOTE — Progress Notes (Signed)
Patient ID: Phillip Kelley, male   DOB: September 13, 1926, 78 y.o.   MRN: [id number] Subjective:  Stable after PPM.  Objective:  Vital Signs in the last 24 hours: Temp:  [97.6 F (36.4 C)-97.8 F (36.6 C)] 97.7 F (36.5 C) (07/11 0531) Pulse Rate:  [58-74] 67  (07/11 0531) Resp:  [18-20] 18  (07/10 2023) BP: (102-186)/(54-94) 136/54 mmHg (07/11 0531) SpO2:  [94 %-97 %] 94 % (07/11 0531) Weight:  [150 lb 9.2 oz (68.3 kg)-160 lb (72.576 kg)] 150 lb 9.2 oz (68.3 kg) (07/11 0531)  Intake/Output from previous day: 07/10 0701 - 07/11 0700 In: -  Out: 500 [Urine:500] Intake/Output from this shift:    Physical Exam: Well appearing elderly man, NAD HEENT: Unremarkable Neck:  No JVD, no thyromegally Lungs:  Clear with no wheezes. Mild ecchymosis over PPM insertion site HEART:  Regular rate rhythm, no murmurs, no rubs, no clicks Abd:  Flat, positive bowel sounds, no organomegally, no rebound, no guarding Ext:  2 plus pulses, no edema, no cyanosis, no clubbing Skin:  No rashes no nodules Neuro:  CN II through XII intact, motor grossly intact  Lab Results: No results found for this basename: WBC:2,HGB:2,PLT:2 in the last 72 hours No results found for this basename: NA:2,K:2,CL:2,CO2:2,GLUCOSE:2,BUN:2,CREATININE:2 in the last 72 hours No results found for this basename: TROPONINI:2,CK,MB:2 in the last 72 hours Hepatic Function Panel No results found for this basename: PROT,ALBUMIN,AST,ALT,ALKPHOS,BILITOT,BILIDIR,IBILI in the last 72 hours No results found for this basename: CHOL in the last 72 hours No results found for this basename: PROTIME in the last 72 hours  Imaging: Dg Chest 2 View  01/26/2012  *RADIOLOGY REPORT*  Clinical Data: Post pacemaker insertion.  CHEST - 2 VIEW  Comparison: 08/25/2008.  Findings: Heart size stable.  Thoracic aorta is calcified. Pulmonary arteries appear mildly prominent.  Pacemaker lead tips project over the right atrium, right ventricle, and posterior base of  the heart on the lateral view, possibly within the left ventricle.  No pneumothorax.  Pleural parenchymal scarring at both costophrenic angles.  Mild bibasilar scarring and/or atelectasis.  IMPRESSION:  1.  Pacemaker placement without pneumothorax. 2.  Bibasilar pleural parenchymal scarring.  Difficult to exclude superimposed atelectasis at the lung bases.  Original Report Authenticated By: Reyes Ivan, M.D.    Cardiac Studies: Tele - NSR with BiV pacing. Assessment/Plan:  1. Symptomatic brady 2. Chronic systolic CHF 3. LBBB/2:1 AVB 4. S/p Biv PPM Rec: reviewed lead data and CXR. Normal device function. Will discharge today. Will keep pressure dressing on and have him return tomorrow to remove to reduce risk of hematoma. Would like ASA held 7 days.  LOS: 1 day    Buel Ream.D. 01/26/2012, 11:17 AM

## 2012-01-26 NOTE — Progress Notes (Signed)
Pt ready for discharge. Discharge instructions given to pt and pt verbalized understanding.

## 2012-01-26 NOTE — Discharge Summary (Signed)
Discharge Summary   Patient ID: Phillip Kelley MRN: [id number], DOB/AGE: 1927/05/09 78 y.o.  Primary MD: [name], MD Primary Cardiologist: Lewayne Bunting MD Admit date: 01/25/2012 D/C date:     01/26/2012      Primary Discharge Diagnoses:  1. Symptomatic bradycardia w/ 2:1 heart block  - s/p Medtronic BiV PPM  Secondary Discharge Diagnoses:  1. Chronic Systolic CHF - EF 35-40% 2. LBBB 3. HTN 4. Anemia 5. Chronic low back pain 6. Arthritis 7. Cataract s/p extraction 8. Anterior fusion cervical spine 2001 9. Total hip arthroplasty - Right 2001 10. Carotid endarterectomy - Left 2008 11. Carpal tunnel release and ganglion cyst excision 12. TONSILLECTOMY AND ADENOIDECTOMY   Allergies Allergies  Allergen Reactions  . Penicillins Rash  . Sulfa Antibiotics Rash    Diagnostic Studies/Procedures:   01/25/12 - BiV PPM implantation Medtronic model 5076, 58 cm, active fixation pacing lead, serial number [id number] was advanced into the right ventricle  Medtronic model 5076, 52 cm, active fixation pacing lead, serial number [id number] was advanced into the right atrium Medtronic Bi-V pacemaker, serial number [id number] was connected to the atrial RV and LV leads   History of Present Illness: 78 y.o. male w/ the above medical problems who presented to [facility] on 01/25/12 for BiV PPM implantation.  During his clinic visit with Dr. [name] on 12/21/11 it was noted that over the last 5 or 6 months the patient had progressive dyspnea, weakness, and fatigue and now has class III heart failure symptoms. After demonstrating an ejection fraction of 40%, the patient was placed on carvedilol 3.125 mg twice daily and developed profound bradycardia with 2:1 heart block and a ventricular rate of 30 beats per minute. His beta blocker was discontinued but he remained bradycardic with high degree heart block and it was felt he would benefit from PPM implantation.  Hospital Course:  He underwent placement of a Medtronic BiV PPM on 01/25/12. He tolerated the procedure well without complications. Post op CXR demonstrated no pneumothorax or other acute findings. His device was interrogated and found to be functioning normally. Due to difficulty maintaining hemostasis during the procedure his pressure dressing was left in place at discharge with plans for him to return to the office tomorrow for removal to reduce the risk of hematoma. We will also hold his ASA for 7 days. He was evaluated by PT who recommended 24hr supervision/assistance which will be provided by his wife as well as home health PT which was arranged by case management.  He was seen and evaluated by Dr. [name] who felt he was stable for discharge home with plans for follow up as scheduled below.  Discharge Vitals: Blood pressure 145/78, pulse 68, temperature 97.9 F (36.6 C), temperature source Oral, resp. rate 18, height 5\' 7"  (1.702 m), weight 150 lb 9.2 oz (68.3 kg), SpO2 96.00%.  Labs: none  Discharge Medications   Medication List  As of 01/26/2012  4:03 PM   STOP taking these medications         aspirin 81 MG tablet         TAKE these medications         acetaminophen 325 MG tablet   Commonly known as: TYLENOL   Take 1-2 tablets (325-650 mg total) by mouth every 6 (six) hours as needed for pain.      calcium-vitamin D 500-200 MG-UNIT per tablet   Take 1 tablet by mouth daily.      furosemide 20 MG  tablet   Commonly known as: LASIX   Take 20 mg by mouth daily.      hydrALAZINE 25 MG tablet   Commonly known as: APRESOLINE   Take 25 mg by mouth 3 (three) times daily.      naproxen sodium 220 MG tablet   Commonly known as: ANAPROX   Take 220 mg by mouth daily as needed. For pain      pravastatin 40 MG tablet   Commonly known as: PRAVACHOL   Take 40 mg by mouth daily.      Tamsulosin HCl 0.4 MG Caps   Commonly known as: FLOMAX   Take 0.4 mg by mouth 2 (two) times daily.             Disposition   Discharge Orders    Future Appointments: Provider: Department: Dept Phone: Center:   01/27/2012 1:30 PM [facility] [phone] [facility]   02/06/2012 10:30 AM [facility] [phone] [facility]   04/23/2012 3:00 PM [facility] [phone] [facility]   04/23/2012 4:00 PM Nada Libman, MD [facility] [place] [phone] [facility]   05/15/2012 9:00 AM Marinus Maw, MD [facility] [phone] [facility]     Future Orders Please Complete By Expires   Diet - low sodium heart healthy      Increase activity slowly      Discharge instructions      Comments:   **PLEASE REMEMBER TO BRING ALL OF YOUR MEDICATIONS TO EACH OF YOUR FOLLOW-UP OFFICE VISITS.  * Please do not take your Aspirin for at least 7 days after your procedure. May resume on 02/02/12 if ok with Dr. [name]     Follow-up Information    Follow up with [facility] on 01/27/2012. (Wound check @ 1:30)    Contact information:   [facility] [address] [place] [address] [phone]      Follow up with [facility] on 02/06/2012. (Device check 10:30)    Contact information:   [employer] [address] [place] [address] [phone]      Follow up with Lewayne Bunting, MD on 05/15/2012. (9:00)    Contact information:   [facility] [address] [place] [address] [phone]           Outstanding Labs/Studies:  none  Duration of Discharge Encounter: Greater than 30 minutes including physician and PA time.  Signed, Keltie Labell PA-C 01/26/2012, 4:03 PM

## 2012-01-26 NOTE — Op Note (Signed)
NAMEMarland Kelley  Kelley, Phillip NO.:  [id number]  MEDICAL RECORD NO.:  [id number]  LOCATION:  2004                         FACILITY:  [facility]  PHYSICIAN:  [name]. [name], MD    DATE OF BIRTH:  1926/09/05  DATE OF PROCEDURE:  01/25/2012 DATE OF DISCHARGE:                              OPERATIVE REPORT   PROCEDURE PERFORMED:  Insertion of a dual chamber biventricular pacemaker.  INDICATION:  Symptomatic bradycardia, chronic systolic heart failure, ejection fraction 35-40%, class 3 with left bundle-branch block and QRS duration of 180 msec.  INTRODUCTION:  The patient is a very pleasant 78 year old male with a history of coronary disease, status post MI, history of cardiomyopathy, chronic systolic heart failure, EF 40%.  He has been unable to take any beta-blocker therapy secondary to his high-grade heart block.  He has 2:1 heart block with a left bundle-branch block at times with left bundle-branch block and AV Wenckebach at other times.  He is on no AV nodal blocking drugs.  He is now referred for Bi-V pacemaker insertion.  PROCEDURE:  After informed consent was obtained, the patient was taken to the Diagnostic EP Lab in the fasting state.  After usual preparation and draping, intravenous fentanyl and midazolam was given for sedation. A 30 mL of lidocaine was infiltrated into the left infraclavicular region.  A 7-cm incision was carried out over this region and electrocautery was utilized to dissect down to the fascial plane. Initial attempts at puncturing the left subclavian vein were unsuccessful.  Venography of the vein was then carried out with contrast injection into the left upper extremity demonstrated a subtotally occluded left subclavian vein.  It was unclear whether this represented a _________ no collaterals.  The vein was punctured more distally and the guidewire was advanced through the subtotal occlusion, x3.  The Medtronic model 5076, 58 cm, active  fixation pacing lead, serial number [id number] was advanced into the right ventricle and a Medtronic model 5076, 52 cm, active fixation pacing lead, serial number [id number] was advanced into the right atrium.  Mapping was first carried out in the right ventricle.  At the final site, the R-waves in the right ventricle measured between 6 and 8 mV.  The pacing impedance was 800 ohms and a threshold 0.5 V at 0.5 msec.  There was prominent injury current.  With the ventricular lead in satisfactory position, attention then turned to placement of atrial leads which placed anterolateral portion of the right atrium where P-waves measured 2 mV, the pacing impedance was 500 ohms, and threshold was 0.8 V at 0.5 msec.  There was minimal injury current with active fixation of the lead.  A 10 V pacing both in the right atrium and the right ventricle did not stimulate the diaphragm. With the right atrial and ventricular leads in satisfactory position, attention was then turned to placement of the left ventricular lead. The coronary sinus guiding catheter was advanced to a 9.5-French sheath along with a 6-French hexapolar EP catheter and the coronary sinus was cannulated without difficulty.  Venography of the coronary sinus was carried out.  It demonstrated a large posterior vein, a medium-sized lateral vein, and an anterior vein.  The initial lateral vein was selected, but the LV lead could not be advanced more than one-third base to apex into the lateral vein.  The posterior vein was then selected and the lead was placed one-half the distance from base to apex with no diaphragmatic stimulation in this location and pacing threshold of approximately 1.5 V at 0.5 msec.  10 V pacing did not stimulate the diaphragm.  With these satisfactory parameters, the guiding catheter was liberated from the LV lead in the usual manner.  The leads were secured to the subpectoral fascia with a figure-of-eight silk  suture.  The sewing sleeve was secured with silk suture. Electrocautery was utilized to make a subcutaneous pocket.  Antibiotic irrigation was utilized to irrigate the pocket.  Electrocautery was utilized to assure hemostasis.  For this note, I would add approximately 20 minutes were taken to obtain hemostasis as the patient was oozing throughout its pocket.  The Medtronic Bi-V pacemaker, serial number [id number] was connected to the atrial RV and LV leads and placed back in the subcutaneous pocket.  The generator was secured with silk suture. The pocket was irrigated with antibiotic irrigation and the incision was closed with 2-0 and 3-0 Vicryl.  Benzoin and Steri-Strips were painted on the skin, pressure dressing was applied, and the patient was returned to his room in satisfactory condition.  COMPLICATIONS:  There were no immediate procedure complications.  RESULTS:  This demonstrates successful implantation of a Medtronic biventricular pacemaker in a patient with symptomatic bradycardia, high- grade heart block, left bundle-branch block, and left ventricular dysfunction with ejection fraction of 35-40% and class 3 heart failure.     [name]. [name], MD     GWT/MEDQ  D:  01/25/2012  T:  01/26/2012  Job:  [id number]  cc:   Pamella Pert, MD

## 2012-01-26 NOTE — Evaluation (Signed)
Physical Therapy Evaluation Patient Details Name: Phillip Kelley MRN: [id number] DOB: Dec 01, 1926 Today's Date: 01/26/2012 Time: 4098-1191 PT Time Calculation (min): 42 min  PT Assessment / Plan / Recommendation Clinical Impression  Patient is S/P pacemaker placement. Secondary to post-op restrictions with left arm weight bearing I have recommended to both patient and spouse that arrangements are made for patient to sleep downstairs to avoid excessive weight bearing on his left arm with descending the stairs. Both patient and spouse are in agreement with this and I would recommend that he also has home health PT follow up to ensure that he progresses well with his mobility.     PT Assessment  Patient needs continued PT services    Follow Up Recommendations  Home health PT;Supervision/Assistance - 24 hour    Barriers to Discharge  None      Equipment Recommendations   (Possibly a walker - will have HH PT address functionality )    Recommendations for Other Services  None  Frequency Min 3X/week    Precautions / Restrictions Precautions Precautions: Fall;ICD/Pacemaker Precaution Comments: Needs constant cueing as even with his arm in a sling he continues to try to use to assist with scooting and sit to stand   Pertinent Vitals/Pain VSS/ No pain      Mobility  Bed Mobility Bed Mobility: Supine to Sit;Sitting - Scoot to Edge of Bed;Sit to Supine Supine to Sit: HOB flat;6: Modified independent (Device/Increase time) Sitting - Scoot to Edge of Bed: 5: Supervision Sit to Supine: HOB flat;6: Modified independent (Device/Increase time) Details for Bed Mobility Assistance: Verbal cues with scooting to not use left arm.  Transfers Transfers: Sit to Stand;Stand to Sit Sit to Stand: 4: Min guard;With upper extremity assist;From bed;From chair/3-in-1 (right arm use) Stand to Sit: Without upper extremity assist;4: Min guard;To bed;To chair/3-in-1 Details for Transfer Assistance: With  initial stand from bed patient needed to use momentum. For subsequent stands (3) patient able to complete without momentum but tries to use left arm to scoot to edge of surface Ambulation/Gait Ambulation/Gait Assistance: 4: Min guard Ambulation Distance (Feet): 120 Feet Assistive device: Straight cane Ambulation/Gait Assistance Details: Legs externally rotated bil. Tendency to limit right leg weight bearing resulting in a quick stride and then a pause as he advances the cane. His cane was adjusted as it was far too tall.   Gait Pattern: Step-to pattern;Decreased stride length;Trunk flexed Stairs: Yes Stairs Assistance: 4: Min assist Stairs Assistance Details (indicate cue type and reason): Practised stairs to ensure patient was not weight bearing too heavily on his left arm. With ascending and rail on the left patient able to perform safely. However, with descending patient with excessive weight on left arm and required assistance to prevent this.  Stair Management Technique: One rail Left;With cane;Step to pattern;Forwards Number of Stairs: 5      PT Diagnosis: Difficulty walking;Generalized weakness  PT Problem List: Decreased strength;Decreased activity tolerance;Decreased mobility;Decreased balance;Decreased knowledge of precautions PT Treatment Interventions: DME instruction;Gait training;Stair training;Therapeutic activities;Therapeutic exercise;Balance training;Patient/family education   PT Goals Acute Rehab PT Goals PT Goal Formulation: With patient Time For Goal Achievement: 02/02/12 Potential to Achieve Goals: Good Pt will go Sit to Stand: with modified independence (right arm use only) PT Goal: Sit to Stand - Progress: Goal set today Pt will go Stand to Sit: with modified independence;without upper extremity assist PT Goal: Stand to Sit - Progress: Goal set today Pt will Ambulate: 51 - 150 feet;with least restrictive assistive device;with supervision PT  Goal: Ambulate -  Progress: Goal set today Pt will Go Up / Down Stairs: Flight;with supervision;with rail(s);with least restrictive assistive device PT Goal: Up/Down Stairs - Progress: Goal set today  Visit Information  Last PT Received On: 01/26/12 Assistance Needed: +1    Subjective Data  Subjective: Patient reports difficulty remembering not to use his left arm. Patient Stated Goal: Home today   Prior Functioning  Home Living Lives With: Spouse Available Help at Discharge: Family;Available 24 hours/day Type of Home: House Home Access: Stairs to enter [employer] of Steps: 2. Also has sunken den but can access with longer walk to avoid stairs. Entrance Stairs-Rails: Right;Left;Can reach both Home Layout: Two level Alternate Level Stairs-Number of Steps: 14 Alternate Level Stairs-Rails: Left Bathroom Shower/Tub: Health visitor: Standard Home Adaptive Equipment: Grab bars in shower;Shower chair with back;Straight cane (trat table) Additional Comments: Discussed with wife regarding sleeping arrangements fro downstairs and this can be arranged.  Prior Function Level of Independence: Needs assistance Needs Assistance: Meal Prep;Light Housekeeping Meal Prep: Total Light Housekeeping: Total Able to Take Stairs?: Yes Driving: Yes Vocation: Retired Comments: Recent falls Communication Communication: No difficulties Dominant Hand: Right    Cognition  Overall Cognitive Status: Appears within functional limits for tasks assessed/performed Arousal/Alertness: Awake/alert Orientation Level: Appears intact for tasks assessed Behavior During Session: [facility] for tasks performed    Extremity/Trunk Assessment Right Lower Extremity Assessment RLE ROM/Strength/Tone: Deficits RLE ROM/Strength/Tone Deficits: Grssoly 4/5 RLE Coordination: WFL - gross/fine motor Left Lower Extremity Assessment LLE ROM/Strength/Tone: Deficits LLE ROM/Strength/Tone Deficits: Grossly 4/5 LLE  Coordination: WFL - gross/fine motor Trunk Assessment Trunk Assessment: Kyphotic (Limited neck ROM)   Balance High Level Balance High Level Balance Comments: Close guarding with cane for safety.   End of Session PT - End of Session Equipment Utilized During Treatment: Gait belt Activity Tolerance: Patient tolerated treatment well Patient left: in bed;with call bell/phone within reach Nurse Communication: Mobility status  GP Functional Assessment Tool Used: Clinical reasoning/judgment Functional Limitation: Mobility: Walking and moving around Mobility: Walking and Moving Around Current Status (Q6578): At least 1 percent but less than 20 percent impaired, limited or restricted Mobility: Walking and Moving Around Goal Status [phone]): At least 1 percent but less than 20 percent impaired, limited or restricted   Edwyna Perfect, PT  Pager [phone]  01/26/2012, 2:23 PM

## 2012-01-27 ENCOUNTER — Ambulatory Visit (INDEPENDENT_AMBULATORY_CARE_PROVIDER_SITE_OTHER): Payer: Medicare Other | Admitting: *Deleted

## 2012-01-27 DIAGNOSIS — I428 Other cardiomyopathies: Secondary | ICD-10-CM

## 2012-01-27 NOTE — Progress Notes (Signed)
Steri strips intact minimal swelling noted, no hematoma.  Follow up as scheduled.

## 2012-01-27 NOTE — Care Management Note (Signed)
    Page 1 of 1   01/27/2012     7:19:39 AM   CARE MANAGEMENT NOTE 01/27/2012  Patient:  Phillip Kelley, Phillip Kelley   Account Number:  [id number]  Date Initiated:  01/27/2012  Documentation initiated by:  Avie Arenas  Subjective/Objective Assessment:   Pacemaker upgrade.  Has wife. Independent prior - walks with cane.     Action/Plan:   Anticipated DC Date:  01/26/2012   Anticipated DC Plan:  HOME W HOME HEALTH SERVICES      DC Planning Services  CM consult      Choice offered to / List presented to:  C-1 Patient        HH arranged  HH-2 PT      [facility] agency  [facility]   Status of service:   Medicare Important Message given?   (If response is "NO", the following Medicare IM given date fields will be blank) Date Medicare IM given:   Date Additional Medicare IM given:    Discharge Disposition:  HOME W HOME HEALTH SERVICES  Per UR Regulation:    If discussed at Long Length of Stay Meetings, dates discussed:    Comments:  01-26-12 7:15am Avie Arenas, RNBSN [phone] Talked with patient yesterday.  States is always unsteady on feet.  Walks with cane.  Does not use a walker as he has a sunken living room in house and feels a walker would not work.  Has had OP rehab and has been to [facility] for unsteadiness with no help.  Willing to try anything that may work.  PT to see prior to discharge.  PT recommended HHPT - discussed with patient and agreeable, did not want nurse as going to the office on Friday to have that checked and didn't feel he would need.  Referral made to [facility] for PT.

## 2012-02-06 ENCOUNTER — Ambulatory Visit (INDEPENDENT_AMBULATORY_CARE_PROVIDER_SITE_OTHER): Payer: Medicare Other | Admitting: *Deleted

## 2012-02-06 ENCOUNTER — Encounter: Payer: Self-pay | Admitting: Internal Medicine

## 2012-02-06 DIAGNOSIS — I428 Other cardiomyopathies: Secondary | ICD-10-CM

## 2012-02-06 LAB — PACEMAKER DEVICE OBSERVATION
AL IMPEDENCE PM: 399 Ohm
AL THRESHOLD: 0.75 V
RV LEAD AMPLITUDE: 7.8 mv
RV LEAD IMPEDENCE PM: 513 Ohm
RV LEAD THRESHOLD: 0.5 V

## 2012-02-06 NOTE — Progress Notes (Signed)
Wound check pacer in clinic  

## 2012-02-15 ENCOUNTER — Telehealth: Payer: Self-pay | Admitting: Internal Medicine

## 2012-02-15 NOTE — Telephone Encounter (Signed)
lmom for HHN to call me but that I did not feel Dr [name] should be the one to extend the PT visits

## 2012-02-15 NOTE — Telephone Encounter (Signed)
She is requesting another six visits for PT and she needs an order

## 2012-02-27 ENCOUNTER — Telehealth: Payer: Self-pay | Admitting: Internal Medicine

## 2012-02-27 NOTE — Telephone Encounter (Signed)
Pt is not feeling as well as he use to he is very tired and feels worse than he did before he got pacer and he wants to know why

## 2012-02-28 NOTE — Telephone Encounter (Signed)
F/u   Pt calling for f/u  On previous c/o, he can be reached [phone]

## 2012-02-28 NOTE — Telephone Encounter (Signed)
He says he does not have any energy no strength and he feels worse.  After 11-12 feels some better.  Wants to ask Dr [name] why. Does he need to have his device adjusted or come in for an office visit.  We will address with Dr [name] tomorrow and call the patient back

## 2012-02-29 NOTE — Telephone Encounter (Signed)
Spoke with patient, per Dr [name] he will hold his furosemide and see if that helps. If he does not feel any better or has swelling he is to call us back and let us know.

## 2012-03-07 ENCOUNTER — Telehealth: Payer: Self-pay | Admitting: Internal Medicine

## 2012-03-07 NOTE — Telephone Encounter (Signed)
Will restart Furosemide at 20mg  daily and have him get an appointment with his PCP  This is what the PT thought he should do also

## 2012-03-07 NOTE — Telephone Encounter (Signed)
New msg [facility] physical therapist called and said he has swelling in his legs and some sob. He is not taking lasix. O2 sats are 96. Please call back

## 2012-03-09 ENCOUNTER — Telehealth: Payer: Self-pay | Admitting: Internal Medicine

## 2012-03-09 NOTE — Telephone Encounter (Signed)
New msg Pt said he was was extremely tired yesterday and had some sob. No chest pain. He wants to discuss. Please call

## 2012-03-09 NOTE — Telephone Encounter (Signed)
Spoke with patient and he feels worse now than before implant.  He wanted to know if we can make adjustments to his device he is going to come in on Monday at 9:30

## 2012-03-12 ENCOUNTER — Ambulatory Visit (INDEPENDENT_AMBULATORY_CARE_PROVIDER_SITE_OTHER): Payer: Medicare Other | Admitting: Internal Medicine

## 2012-03-12 ENCOUNTER — Encounter: Payer: Self-pay | Admitting: Internal Medicine

## 2012-03-12 VITALS — BP 150/96 | HR 72 | Ht 66.5 in | Wt 156.0 lb

## 2012-03-12 DIAGNOSIS — I5022 Chronic systolic (congestive) heart failure: Secondary | ICD-10-CM

## 2012-03-12 DIAGNOSIS — I428 Other cardiomyopathies: Secondary | ICD-10-CM | POA: Insufficient documentation

## 2012-03-12 DIAGNOSIS — Z950 Presence of cardiac pacemaker: Secondary | ICD-10-CM

## 2012-03-12 LAB — PACEMAKER DEVICE OBSERVATION
ATRIAL PACING PM: 2.99
BAMS-0001: 170 {beats}/min
BATTERY VOLTAGE: 3.0338 V
LV LEAD THRESHOLD: 1 V
VENTRICULAR PACING PM: 99.2

## 2012-03-12 MED ORDER — FUROSEMIDE 20 MG PO TABS: ORAL_TABLET | ORAL | Status: DC

## 2012-03-12 MED ORDER — CARVEDILOL 3.125 MG PO TABS: 3.1250 mg | ORAL_TABLET | Freq: Two times a day (BID) | ORAL | Status: DC

## 2012-03-12 NOTE — Progress Notes (Signed)
HPI Phillip Kelley returns today for followup. He has a longstanding cardiomyopathy and worsening CHF who underwent BiV PPM for CHB with LBBB. He has not felt well since his procedure. He is sob, weak and tired. No energy. He had tried low dose lasix and then stopped it. He has peripheral edema. No syncope. He feels worse in the morning. He admits to peripheral edema. His QRS narrowed from 164 to 145 with Biv pacing.   Allergies  Allergen Reactions  . Penicillins Rash  . Sulfa Antibiotics Rash     Current Outpatient Prescriptions  Medication Sig Dispense Refill  . Calcium Carbonate-Vitamin D (CALCIUM-VITAMIN D) 500-200 MG-UNIT per tablet Take 1 tablet by mouth daily.       . furosemide (LASIX) 20 MG tablet Take 20 mg by mouth daily.       . hydrALAZINE (APRESOLINE) 25 MG tablet Take 25 mg by mouth 3 (three) times daily.       . Multiple Vitamin (MULTIVITAMIN) tablet Take 1 tablet by mouth daily.      . naproxen sodium (ANAPROX) 220 MG tablet Take 220 mg by mouth daily as needed. For pain      . pravastatin (PRAVACHOL) 40 MG tablet Take 40 mg by mouth daily.       . Tamsulosin HCl (FLOMAX) 0.4 MG CAPS Take 0.4 mg by mouth 2 (two) times daily.          Past Medical History  Diagnosis Date  . Cataract     "right needs taken out" (01/25/12)  . Hypertension   . Chronic systolic CHF (congestive heart failure)   . Symptomatic bradycardia 01/25/12    s/p Medtronic BiV PPM  . Anemia     "my RBC's are low"  . History of blood transfusion 2001    "w/hip operation"  . Arthritis     "all over"  . Chronic lower back pain     "arthritis"  . Heart block     s/p Medtronic BiV PPM    ROS:   All systems reviewed and negative except as noted in the HPI.   Past Surgical History  Procedure Date  . Anterior fusion cervical spine 2001    for numbness in right arm, left with anhydrosis  . Total hip arthroplasty 2001    right; fell  . Carotid endarterectomy ~ 2008    left  . Carpal tunnel  release     bilaterally  . Insert / replace / remove pacemaker 01/25/12    Medtronic BiV PPM  . Tonsillectomy and adenoidectomy ~ 1933  . Joint replacement   . Cataract extraction w/ intraocular lens implant ~2011    left  . Ganglion cyst excision     ? left     No family history on file.   History   Social History  . Marital Status: Married    Spouse Name: N/A    Number of Children: N/A  . Years of Education: N/A   Occupational History  . Not on file.   Social History Main Topics  . Smoking status: Former Smoker -- 1.0 packs/day for 35 years    Types: Cigarettes    Quit date: 08/21/1977  . Smokeless tobacco: Never Used  . Alcohol Use: 12.6 oz/week    14 Glasses of wine, 7 Shots of liquor per week  . Drug Use: Not on file  . Sexually Active: Not Currently   Other Topics Concern  . Not on file   Social History  Narrative  . No narrative on file     BP 150/96  Pulse 72  Ht 5' 6.5" (1.689 m)  Wt 156 lb (70.761 kg)  BMI 24.80 kg/m2  Physical Exam:  Ill appearing elderly man, NAD HEENT: Unremarkable Neck:  7 cm JVD, no thyromegally Lungs:  Rales in the bases bilaterally, 1/3 up.  HEART:  Regular rate rhythm, no murmurs, no rubs, no clicks Abd:  soft, positive bowel sounds, no organomegally, no rebound, no guarding Ext:  2 plus pulses, no edema, no cyanosis, no clubbing Skin:  No rashes no nodules Neuro:  CN II through XII intact, motor grossly intact  DEVICE  Normal device function.  See PaceArt for details. 99% BiV pacing  Assess/Plan:

## 2012-03-12 NOTE — Assessment & Plan Note (Signed)
It is a little difficult to tell whether he is too wet or too dry but his symptoms and physical exam suggest fluid overload. I have asked him to increase his lasix to 60 mg daily for 3 days then 40 mg therafter. He will start low dose coreg with plans to uptitrate as his blood pressure allows. He will return in a week for labs. I will ask Dr. Jacinto Halim to repeat a 2 D echo in the next few months.

## 2012-03-12 NOTE — Patient Instructions (Addendum)
Your physician recommends that you schedule a follow-up appointment in: 4 weeks with Dr [name] Your physician has recommended you make the following change in your medication: START Fursemide 20 mg three times a day for three days then twice a day after and START Carvedilol 3.125 twice a day

## 2012-03-12 NOTE — Assessment & Plan Note (Signed)
His medtronic Biv PPM is working normally. Will follow.

## 2012-03-20 ENCOUNTER — Other Ambulatory Visit (INDEPENDENT_AMBULATORY_CARE_PROVIDER_SITE_OTHER): Payer: Medicare Other

## 2012-03-20 DIAGNOSIS — I5022 Chronic systolic (congestive) heart failure: Secondary | ICD-10-CM

## 2012-03-20 LAB — BASIC METABOLIC PANEL
Chloride: 102 mEq/L (ref 96–112)
Creatinine, Ser: 1.5 mg/dL (ref 0.4–1.5)
Potassium: 4.6 mEq/L (ref 3.5–5.1)
Sodium: 137 mEq/L (ref 135–145)

## 2012-03-21 ENCOUNTER — Encounter: Payer: Self-pay | Admitting: Internal Medicine

## 2012-04-17 ENCOUNTER — Ambulatory Visit (INDEPENDENT_AMBULATORY_CARE_PROVIDER_SITE_OTHER): Payer: Medicare Other | Admitting: Internal Medicine

## 2012-04-17 ENCOUNTER — Encounter: Payer: Self-pay | Admitting: Internal Medicine

## 2012-04-17 ENCOUNTER — Telehealth: Payer: Self-pay | Admitting: Internal Medicine

## 2012-04-17 VITALS — BP 152/76 | HR 58 | Ht 67.0 in | Wt 151.0 lb

## 2012-04-17 DIAGNOSIS — I428 Other cardiomyopathies: Secondary | ICD-10-CM

## 2012-04-17 DIAGNOSIS — Z950 Presence of cardiac pacemaker: Secondary | ICD-10-CM

## 2012-04-17 DIAGNOSIS — I5022 Chronic systolic (congestive) heart failure: Secondary | ICD-10-CM

## 2012-04-17 LAB — PACEMAKER DEVICE OBSERVATION
AL IMPEDENCE PM: 456 Ohm
ATRIAL PACING PM: 6.42
BAMS-0001: 170 {beats}/min
BATTERY VOLTAGE: 3.025 V

## 2012-04-17 MED ORDER — LOSARTAN POTASSIUM 25 MG PO TABS: 25.0000 mg | ORAL_TABLET | Freq: Every day | ORAL | Status: DC

## 2012-04-17 NOTE — Assessment & Plan Note (Signed)
His Medtronic dual-chamber biventricular pacemaker is working normally. We'll plan to recheck in several months. 

## 2012-04-17 NOTE — Addendum Note (Signed)
Addended by: Dennis Bast F on: 04/17/2012 10:21 AM   Modules accepted: Orders

## 2012-04-17 NOTE — Assessment & Plan Note (Signed)
The patient's symptoms are improved slightly. Because his blood pressure remains elevated, and because his beta blocker intolerance is marked, we will try low-dose losartan. He will come back in a week to check his potassium and creatinine.

## 2012-04-17 NOTE — Telephone Encounter (Signed)
Pt would like to know if he needs to take losartrin because he was taken off of it previously because it increased his potassium

## 2012-04-17 NOTE — Patient Instructions (Addendum)
Your physician wants you to follow-up in: Feb 2014 You will receive a reminder letter in the mail two months in advance. If you don't receive a letter, please call our office to schedule the follow-up appointment.  Your physician has recommended you make the following change in your medication:  1) Start Losartan 25mg  daily   Your physician recommends that you return for lab work----in 1 week  BMP

## 2012-04-17 NOTE — Progress Notes (Signed)
HPI Phillip Kelley returns today for followup. He is a very pleasant 78 year old man with a history of chronic systolic heart failure and bradycardia, with a history of left bundle branch block. He underwent biventricular pacemaker insertion several months ago. Initially, he felt poorly. When I saw him in followup, it was my impression that he was volume overloaded and we increase his Lasix. I tried to up titrate his beta blocker but he felt worse and this was ultimately discontinued as he is intolerant to beta blockade. He is trying to maintain a low-salt diet. He checks his blood pressure at home. Her systolic pressures typically run between 140s and 160 mm mercury. Allergies  Allergen Reactions  . Penicillins Rash  . Sulfa Antibiotics Rash     Current Outpatient Prescriptions  Medication Sig Dispense Refill  . aspirin 81 MG tablet Take 81 mg by mouth daily.      . Calcium Carbonate-Vitamin D (CALCIUM-VITAMIN D) 500-200 MG-UNIT per tablet Take 1 tablet by mouth daily.       . furosemide (LASIX) 20 MG tablet Three times a week 3 tabs daily and four times a week 2 tabs daily      . hydrALAZINE (APRESOLINE) 25 MG tablet Take 25 mg by mouth 3 (three) times daily.       . Misc Natural Products (OSTEO BI-FLEX ADV DOUBLE ST PO) Take by mouth daily.      . Multiple Vitamin (MULTIVITAMIN) tablet Take 1 tablet by mouth daily.      . naproxen sodium (ANAPROX) 220 MG tablet Take 220 mg by mouth daily as needed. For pain      . pravastatin (PRAVACHOL) 40 MG tablet Take 40 mg by mouth daily.       . Tamsulosin HCl (FLOMAX) 0.4 MG CAPS Take 0.4 mg by mouth 2 (two) times daily.       Marland Kitchen DISCONTD: furosemide (LASIX) 20 MG tablet Take 20 mg 3 tabs a day for three days and then twice daily after that  200 tablet  3     Past Medical History  Diagnosis Date  . Cataract     "right needs taken out" (01/25/12)  . Hypertension   . Chronic systolic CHF (congestive heart failure)   . Symptomatic bradycardia 01/25/12      s/p Medtronic BiV PPM  . Anemia     "my RBC's are low"  . History of blood transfusion 2001    "w/hip operation"  . Arthritis     "all over"  . Chronic lower back pain     "arthritis"  . Heart block     s/p Medtronic BiV PPM    ROS:   All systems reviewed and negative except as noted in the HPI.   Past Surgical History  Procedure Date  . Anterior fusion cervical spine 2001    for numbness in right arm, left with anhydrosis  . Total hip arthroplasty 2001    right; fell  . Carotid endarterectomy ~ 2008    left  . Carpal tunnel release     bilaterally  . Insert / replace / remove pacemaker 01/25/12    Medtronic BiV PPM  . Tonsillectomy and adenoidectomy ~ 1933  . Joint replacement   . Cataract extraction w/ intraocular lens implant ~2011    left  . Ganglion cyst excision     ? left     No family history on file.   History   Social History  . Marital Status: Married  Spouse Name: N/A    Number of Children: N/A  . Years of Education: N/A   Occupational History  . Not on file.   Social History Main Topics  . Smoking status: Former Smoker -- 1.0 packs/day for 35 years    Types: Cigarettes    Quit date: 08/21/1977  . Smokeless tobacco: Never Used  . Alcohol Use: 12.6 oz/week    14 Glasses of wine, 7 Shots of liquor per week  . Drug Use: Not on file  . Sexually Active: Not Currently   Other Topics Concern  . Not on file   Social History Narrative  . No narrative on file     BP 152/76  Pulse 58  Ht 5\' 7"  (1.702 m)  Wt 151 lb (68.493 kg)  BMI 23.65 kg/m2  SpO2 99%  Physical Exam:  Elderly appearing 78 year old man, NAD HEENT: Unremarkable Neck: 7 cm JVD, no thyromegally Lungs:  Clear with rales in the bases bilaterally but no wheezes or rhonchi and no increased work of breathing. HEART:  Regular rate rhythm, no murmurs, no rubs, no clicks Abd:  soft, positive bowel sounds, no organomegally, no rebound, no guarding Ext:  2 plus pulses, no  edema, no cyanosis, no clubbing Skin:  No rashes no nodules Neuro:  CN II through XII intact, motor grossly intact  DEVICE  Normal device function.  See PaceArt for details.   Assess/Plan:

## 2012-04-17 NOTE — Telephone Encounter (Signed)
Spoke with patient  Do not take and I have listed this in his chart

## 2012-04-19 ENCOUNTER — Other Ambulatory Visit: Payer: Self-pay | Admitting: *Deleted

## 2012-04-19 DIAGNOSIS — I6529 Occlusion and stenosis of unspecified carotid artery: Secondary | ICD-10-CM

## 2012-04-19 DIAGNOSIS — Z48812 Encounter for surgical aftercare following surgery on the circulatory system: Secondary | ICD-10-CM

## 2012-04-23 ENCOUNTER — Ambulatory Visit: Payer: Medicare Other | Admitting: Surgery

## 2012-04-23 ENCOUNTER — Other Ambulatory Visit: Payer: Medicare Other

## 2012-04-24 ENCOUNTER — Ambulatory Visit: Payer: Medicare Other | Admitting: Neurosurgery

## 2012-04-24 ENCOUNTER — Other Ambulatory Visit: Payer: Medicare Other

## 2012-04-30 ENCOUNTER — Encounter: Payer: Self-pay | Admitting: Neurosurgery

## 2012-05-01 ENCOUNTER — Ambulatory Visit: Payer: Medicare Other | Admitting: Neurosurgery

## 2012-05-01 ENCOUNTER — Other Ambulatory Visit (INDEPENDENT_AMBULATORY_CARE_PROVIDER_SITE_OTHER): Payer: Medicare Other | Admitting: *Deleted

## 2012-05-01 DIAGNOSIS — I6529 Occlusion and stenosis of unspecified carotid artery: Secondary | ICD-10-CM

## 2012-05-01 DIAGNOSIS — Z48812 Encounter for surgical aftercare following surgery on the circulatory system: Secondary | ICD-10-CM

## 2012-05-15 ENCOUNTER — Encounter: Payer: Medicare Other | Admitting: Internal Medicine

## 2012-07-22 ENCOUNTER — Other Ambulatory Visit: Payer: Self-pay | Admitting: Internal Medicine

## 2012-08-02 ENCOUNTER — Ambulatory Visit (INDEPENDENT_AMBULATORY_CARE_PROVIDER_SITE_OTHER): Payer: Medicare Other | Admitting: *Deleted

## 2012-08-02 ENCOUNTER — Encounter: Payer: Self-pay | Admitting: Internal Medicine

## 2012-08-02 DIAGNOSIS — I5022 Chronic systolic (congestive) heart failure: Secondary | ICD-10-CM

## 2012-08-02 DIAGNOSIS — I428 Other cardiomyopathies: Secondary | ICD-10-CM

## 2012-08-02 LAB — PACEMAKER DEVICE OBSERVATION
ATRIAL PACING PM: 15.6
BAMS-0001: 170 {beats}/min
BATTERY VOLTAGE: 3.0266 V
LV LEAD THRESHOLD: 1.25 V
VENTRICULAR PACING PM: 97.88

## 2012-08-02 NOTE — Progress Notes (Signed)
PPM check 

## 2012-08-02 NOTE — Patient Instructions (Addendum)
Return office visit 09/10/12 @ 3:30pm with Dr. [name].

## 2012-09-10 ENCOUNTER — Encounter: Payer: Medicare Other | Admitting: Internal Medicine

## 2012-09-12 ENCOUNTER — Ambulatory Visit (INDEPENDENT_AMBULATORY_CARE_PROVIDER_SITE_OTHER): Payer: Medicare Other | Admitting: Internal Medicine

## 2012-09-12 ENCOUNTER — Encounter: Payer: Self-pay | Admitting: Internal Medicine

## 2012-09-12 VITALS — BP 127/65 | HR 74 | Ht 67.0 in | Wt 156.0 lb

## 2012-09-12 LAB — PACEMAKER DEVICE OBSERVATION
AL AMPLITUDE: 2.5 mv
AL THRESHOLD: 0.75 V
RV LEAD IMPEDENCE PM: 513 Ohm
RV LEAD THRESHOLD: 0.5 V
VENTRICULAR PACING PM: 97.04

## 2012-09-12 NOTE — Assessment & Plan Note (Signed)
His Medtronic biventricular pacemaker is functioning normally. I've recommended that he undergo A- V Optimization. This will be scheduled in the next few weeks.

## 2012-09-12 NOTE — Progress Notes (Signed)
HPI Phillip Kelley returns today for followup. He is a very pleasant elderly man with a history of chronic systolic heart failure, left bundle branch block, pulmonary hypertension, syncope, status post biventricular pacemaker insertion. The patient has had very little improvement in his heart failure symptoms with biventricular pacing. He recently fell and injured his right shoulder and right hip and leg. His activity is markedly reduced sensitivity injured himself 3 weeks ago. He denies anginal symptoms. He has not had syncope. He is currently sedentary and has a difficult time standing much less walking. His biggest limitation at the present time is pain though he is still severely weak. Allergies  Allergen Reactions  . Penicillins Rash  . Sulfa Antibiotics Rash  . Losartan     Hyperkalemia      Current Outpatient Prescriptions  Medication Sig Dispense Refill  . Pseudoeph-Chlorphen-Hydrocod (HYDROCOD/CP/PSEUD PO) Take 1 tablet by mouth every 6 (six) hours.      Marland Kitchen aspirin 81 MG tablet Take 81 mg by mouth daily.      . Calcium Carbonate-Vitamin D (CALCIUM-VITAMIN D) 500-200 MG-UNIT per tablet Take 1 tablet by mouth daily.       . furosemide (LASIX) 20 MG tablet 20 mg 2 (two) times daily.       . hydrALAZINE (APRESOLINE) 25 MG tablet TAKE 1 TABLET BY MOUTH 3 TIMES A DAY  90 tablet  5  . Misc Natural Products (OSTEO BI-FLEX ADV DOUBLE ST PO) Take by mouth daily.      . Multiple Vitamin (MULTIVITAMIN) tablet Take 1 tablet by mouth daily.      . naproxen sodium (ANAPROX) 220 MG tablet Take 220 mg by mouth daily as needed. For pain      . Tamsulosin HCl (FLOMAX) 0.4 MG CAPS Take 0.4 mg by mouth 2 (two) times daily.        No current facility-administered medications for this visit.     Past Medical History  Diagnosis Date  . Cataract     "right needs taken out" (01/25/12)  . Hypertension   . Chronic systolic CHF (congestive heart failure)   . Symptomatic bradycardia 01/25/12    s/p Medtronic  BiV PPM  . Anemia     "my RBC's are low"  . History of blood transfusion 2001    "w/hip operation"  . Arthritis     "all over"  . Chronic lower back pain     "arthritis"  . Heart block     s/p Medtronic BiV PPM    ROS:   All systems reviewed and negative except as noted in the HPI.   Past Surgical History  Procedure Laterality Date  . Anterior fusion cervical spine  2001    for numbness in right arm, left with anhydrosis  . Total hip arthroplasty  2001    right; fell  . Carotid endarterectomy  ~ 2008    left  . Carpal tunnel release      bilaterally  . Insert / replace / remove pacemaker  01/25/12    Medtronic BiV PPM  . Tonsillectomy and adenoidectomy  ~ 1933  . Joint replacement    . Cataract extraction w/ intraocular lens implant  ~2011    left  . Ganglion cyst excision      ? left     No family history on file.   History   Social History  . Marital Status: Married    Spouse Name: N/A    Number of Children: N/A  .  Years of Education: N/A   Occupational History  . Not on file.   Social History Main Topics  . Smoking status: Former Smoker -- 1.00 packs/day for 35 years    Types: Cigarettes    Quit date: 08/21/1977  . Smokeless tobacco: Never Used  . Alcohol Use: 12.6 oz/week    14 Glasses of wine, 7 Shots of liquor per week  . Drug Use: Not on file  . Sexually Active: Not Currently   Other Topics Concern  . Not on file   Social History Narrative  . No narrative on file     BP 127/65  Pulse 74  Ht 5\' 7"  (1.702 m)  Wt 156 lb (70.761 kg)  BMI 24.43 kg/m2  SpO2 95%  Physical Exam:  Chronically ill appearing 79 year old man, NAD HEENT: Unremarkable Neck:  7 cm JVD, no thyromegally Lungs:  Clear except for rare scattered rales. No wheezes or rhonchi HEART:  Regular rate rhythm, 2/6 systolic murmurs, no rubs, no clicks Abd:  soft, positive bowel sounds, no organomegally, no rebound, no guarding Ext:  2 plus pulses, no edema, no cyanosis,  no clubbing Skin:  No rashes no nodules Neuro:  CN II through XII intact, motor grossly intact   DEVICE  Normal device function.  See PaceArt for details.   Assess/Plan:

## 2012-09-12 NOTE — Assessment & Plan Note (Signed)
His chronic systolic heart failure is now well-controlled. While he is short of breath, his fluid index is not elevated, nor is his weight. I will attempt to augment his cardiac output with AV optimization in the next few weeks. If no benefit, I would consider referral for enhanced heart failure evaluation.

## 2012-09-12 NOTE — Patient Instructions (Addendum)
Your physician wants you to follow-up in: July 2014 with Dr. [name] & pacer check. You will receive a reminder letter in the mail two months in advance. If you don't receive a letter, please call our office to schedule the follow-up appointment.  Your physician has requested that you have an echocardiogram with AV optimization on the same day Dr. [name] is in the office & [employer] representative will be notified of appointment. Echocardiography is a painless test that uses sound waves to create images of your heart. It provides your doctor with information about the size and shape of your heart and how well your heart's chambers and valves are working. This procedure takes approximately one hour. There are no restrictions for this procedure.  Your physician recommends that you schedule a follow-up appointment in: 6 weeks with Dr. [name].   Continue current medications as prescribed

## 2012-09-20 ENCOUNTER — Ambulatory Visit (HOSPITAL_COMMUNITY): Payer: Medicare Other | Attending: Cardiology | Admitting: Radiology

## 2012-09-20 ENCOUNTER — Ambulatory Visit (INDEPENDENT_AMBULATORY_CARE_PROVIDER_SITE_OTHER): Payer: Medicare Other | Admitting: *Deleted

## 2012-09-20 ENCOUNTER — Other Ambulatory Visit: Payer: Self-pay | Admitting: Internal Medicine

## 2012-09-20 DIAGNOSIS — I2789 Other specified pulmonary heart diseases: Secondary | ICD-10-CM | POA: Insufficient documentation

## 2012-09-20 DIAGNOSIS — E785 Hyperlipidemia, unspecified: Secondary | ICD-10-CM | POA: Insufficient documentation

## 2012-09-20 DIAGNOSIS — I2589 Other forms of chronic ischemic heart disease: Secondary | ICD-10-CM | POA: Insufficient documentation

## 2012-09-20 DIAGNOSIS — I447 Left bundle-branch block, unspecified: Secondary | ICD-10-CM | POA: Insufficient documentation

## 2012-09-20 LAB — PACEMAKER DEVICE OBSERVATION
BATTERY VOLTAGE: 3.03 V
LV LEAD IMPEDENCE PM: 570 Ohm
LV LEAD THRESHOLD: 0.875 V
RV LEAD THRESHOLD: 0.5 V

## 2012-09-20 NOTE — Progress Notes (Signed)
AV opt echo done with industry.

## 2012-09-20 NOTE — Progress Notes (Signed)
Echocardiogram performed with optimization with Dr. [name].

## 2012-10-25 ENCOUNTER — Encounter: Payer: Medicare Other | Admitting: Internal Medicine

## 2012-11-15 ENCOUNTER — Encounter: Payer: Self-pay | Admitting: Internal Medicine

## 2012-11-15 ENCOUNTER — Ambulatory Visit (INDEPENDENT_AMBULATORY_CARE_PROVIDER_SITE_OTHER): Payer: Medicare Other | Admitting: Internal Medicine

## 2012-11-15 VITALS — BP 151/78 | HR 79 | Ht 66.0 in | Wt 157.4 lb

## 2012-11-15 DIAGNOSIS — R2689 Other abnormalities of gait and mobility: Secondary | ICD-10-CM

## 2012-11-15 DIAGNOSIS — I5022 Chronic systolic (congestive) heart failure: Secondary | ICD-10-CM

## 2012-11-15 DIAGNOSIS — Z950 Presence of cardiac pacemaker: Secondary | ICD-10-CM

## 2012-11-15 DIAGNOSIS — I428 Other cardiomyopathies: Secondary | ICD-10-CM

## 2012-11-15 DIAGNOSIS — R29818 Other symptoms and signs involving the nervous system: Secondary | ICD-10-CM

## 2012-11-15 LAB — PACEMAKER DEVICE OBSERVATION
AL AMPLITUDE: 1.6 mv
AL IMPEDENCE PM: 475 Ohm
AL THRESHOLD: 1 V
RV LEAD AMPLITUDE: 9.3 mv
RV LEAD IMPEDENCE PM: 494 Ohm

## 2012-11-15 NOTE — Patient Instructions (Addendum)
Your physician recommends that you schedule a follow-up appointment in: 3 months with Dr Taylor  

## 2012-11-15 NOTE — Assessment & Plan Note (Signed)
His Medtronic biventricular pacemaker is working normally. Review of his histograms demonstrates that his activity level has improved still quite low. He has had no arrhythmias. His leads are working normally. We'll plan to recheck in several months.

## 2012-11-15 NOTE — Assessment & Plan Note (Signed)
I've asked the patient to stop drinking alcohol completely for one month. If his problems with balance and gait improve, he will need to abstain from alcohol indefinitely.

## 2012-11-15 NOTE — Assessment & Plan Note (Signed)
His current symptoms are class IIb. He is not limited by heart failure but rather leg weakness. He will continue his current medical therapy. I've asked the patient to stop drinking alcohol altogether. In addition, he is instructed to maintain a low-sodium diet.

## 2012-11-15 NOTE — Progress Notes (Signed)
HPI Mr.Tarnow returns today for followup.he is an 79 year old man with a history of chronic systolic heart failure, syncope, left bundle branch block, status post biventricular pacemaker insertion. The patient sustained a fall back in February. Prior to that, his activity level was reasonable. After his fall, he complained of increasing fatigue, weakness, and shortness of breath. He was sedentary. After I saw the patient, I recommended AV optimization. His primary physician has referred him for physical therapy. Overall he is improved, mostly because of physical therapy. He complains of difficulty with balance and strength. He is unsteady on his feet. He has had no recurrent syncope. He admits to drinking 3 or 4 alcoholic beverages per day. This is despite his advanced age. Remotely, there is much heavier use of alcohol. The patient is limited by his inability to walk more than a short distance without developing fatigue and weakness in his legs. Allergies  Allergen Reactions  . Penicillins Rash  . Sulfa Antibiotics Rash  . Losartan     Hyperkalemia      Current Outpatient Prescriptions  Medication Sig Dispense Refill  . aspirin 81 MG tablet Take 81 mg by mouth daily.      . Calcium Carbonate-Vitamin D (CALCIUM-VITAMIN D) 500-200 MG-UNIT per tablet Take 1 tablet by mouth daily.       . furosemide (LASIX) 20 MG tablet 20 mg 2 (two) times daily.       . hydrALAZINE (APRESOLINE) 25 MG tablet TAKE 1 TABLET BY MOUTH 3 TIMES A DAY  90 tablet  5  . Misc Natural Products (OSTEO BI-FLEX ADV DOUBLE ST PO) Take by mouth daily.      . Multiple Vitamin (MULTIVITAMIN) tablet Take 1 tablet by mouth daily.      . naproxen sodium (ANAPROX) 220 MG tablet Take 220 mg by mouth daily as needed. For pain      . Pseudoeph-Chlorphen-Hydrocod (HYDROCOD/CP/PSEUD PO) Take 1 tablet by mouth every 6 (six) hours.      . Tamsulosin HCl (FLOMAX) 0.4 MG CAPS Take 0.4 mg by mouth 2 (two) times daily.        No current  facility-administered medications for this visit.     Past Medical History  Diagnosis Date  . Cataract     "right needs taken out" (01/25/12)  . Hypertension   . Chronic systolic CHF (congestive heart failure)   . Symptomatic bradycardia 01/25/12    s/p Medtronic BiV PPM  . Anemia     "my RBC's are low"  . History of blood transfusion 2001    "w/hip operation"  . Arthritis     "all over"  . Chronic lower back pain     "arthritis"  . Heart block     s/p Medtronic BiV PPM    ROS:   All systems reviewed and negative except as noted in the HPI.   Past Surgical History  Procedure Laterality Date  . Anterior fusion cervical spine  2001    for numbness in right arm, left with anhydrosis  . Total hip arthroplasty  2001    right; fell  . Carotid endarterectomy  ~ 2008    left  . Carpal tunnel release      bilaterally  . Insert / replace / remove pacemaker  01/25/12    Medtronic BiV PPM  . Tonsillectomy and adenoidectomy  ~ 1933  . Joint replacement    . Cataract extraction w/ intraocular lens implant  ~2011    left  . Ganglion  cyst excision      ? left     No family history on file.   History   Social History  . Marital Status: Married    Spouse Name: N/A    Number of Children: N/A  . Years of Education: N/A   Occupational History  . Not on file.   Social History Main Topics  . Smoking status: Former Smoker -- 1.00 packs/day for 35 years    Types: Cigarettes    Quit date: 08/21/1977  . Smokeless tobacco: Never Used  . Alcohol Use: 12.6 oz/week    14 Glasses of wine, 7 Shots of liquor per week  . Drug Use: Not on file  . Sexually Active: Not Currently   Other Topics Concern  . Not on file   Social History Narrative  . No narrative on file     BP 151/78  Pulse 79  Ht 5\' 6"  (1.676 m)  Wt 157 lb 6.4 oz (71.396 kg)  BMI 25.42 kg/m2  Physical Exam:  Well appearing 79 year old man,NAD HEENT: Unremarkable Neck:  7 cm JVD, no thyromegally Lungs:   Clear except for scattered rales in the bases. No wheezes or rhonchi. HEART:  Regular rate rhythm, no murmurs, no rubs, no clicks Abd:  soft, positive bowel sounds, no organomegally, no rebound, no guarding Ext:  2 plus pulses, no edema, no cyanosis, no clubbing Skin:  No rashes no nodules Neuro:  CN II through XII intact, motor grossly intact  DEVICE  Normal device function.  See PaceArt for details.   Assess/Plan:

## 2012-11-29 ENCOUNTER — Encounter: Payer: Self-pay | Admitting: Neurology

## 2012-11-29 ENCOUNTER — Ambulatory Visit (INDEPENDENT_AMBULATORY_CARE_PROVIDER_SITE_OTHER): Payer: Medicare Other | Admitting: Neurology

## 2012-11-29 VITALS — BP 134/75 | HR 71 | Temp 97.8°F | Ht 66.0 in | Wt 153.0 lb

## 2012-11-29 DIAGNOSIS — R269 Unspecified abnormalities of gait and mobility: Secondary | ICD-10-CM

## 2012-11-29 DIAGNOSIS — R29818 Other symptoms and signs involving the nervous system: Secondary | ICD-10-CM

## 2012-11-29 DIAGNOSIS — R296 Repeated falls: Secondary | ICD-10-CM

## 2012-11-29 DIAGNOSIS — M47812 Spondylosis without myelopathy or radiculopathy, cervical region: Secondary | ICD-10-CM

## 2012-11-29 DIAGNOSIS — G609 Hereditary and idiopathic neuropathy, unspecified: Secondary | ICD-10-CM

## 2012-11-29 DIAGNOSIS — R2689 Other abnormalities of gait and mobility: Secondary | ICD-10-CM

## 2012-11-29 DIAGNOSIS — R293 Abnormal posture: Secondary | ICD-10-CM

## 2012-11-29 DIAGNOSIS — Z9181 History of falling: Secondary | ICD-10-CM

## 2012-11-29 NOTE — Progress Notes (Signed)
Subjective:    Patient ID: Phillip Kelley is a 79 y.o. male.  HPI  Huston Foley, MD, PhD [facility] [address], [address] [address] [place], [place] [address]  Dear Dr. Cyndia Bent,   I saw your patient, Phillip Kelley, upon your kind request in my neurologic clinic today for initial consultation of his gait disorder and falls. The patient is accompanied by his wife today. As you know, Phillip Kelley is a very pleasant 79 year old right-handed gentleman with an underlying medical history of heart block, s/p PM, hypertension, prostate hypertrophy, chronic shoulder pain, and insomnia, who has been experiencing balance problems and gait instability for the past 2 years. He has had multiple falls over the course of the past couple of years. He has a longer standing hx of feeling dizzy for years and is s/p mastoidectomy on the L x 2. 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 His wife adds, that he used to be a boxer in college. He had a neck fusion in 2000. He had L CEA in 2006. He had a PM placed last year.   His current medications are baby aspirin, calcium, Lasix, glucosamine-chondroitin, hydralazine, Vicodin as needed, Linzess, Flomax, multivitamin and tramadol. He fell at home in April and injured his right knee. He had a steroid injection at his orthopedics office. He also had injection to his shoulder for chronic shoulder pain. He felt that his shoulder pain exacerbated after his fall. He has never head injury with loss of consciousness or concussion or contusion. He has never had vertigo. He does not describe postural dizziness. He has not fallen after he has been using his walker. He still drives but his wife is concerned about his driving skills because he is really limited in his upper body and neck movement and has a very stooped posture. This gets worse by the end of the day. He does not describe much in the way of memory problem but does have mild forgetfulness. Never  had a TIA or a Stroke, and denies one sided weakness, numbness, tingling, slurring of speech or droopy face and denies recurrent headaches.   His Past Medical History Is Significant For: Past Medical History  Diagnosis Date  . Cataract     "right needs taken out" (01/25/12)  . Hypertension   . Chronic systolic CHF (congestive heart failure)   . Symptomatic bradycardia 01/25/12    s/p Medtronic BiV PPM  . Anemia     "my RBC's are low"  . History of blood transfusion 2001    "w/hip operation"  . Arthritis     "all over"  . Chronic lower back pain     "arthritis"  . Heart block     s/p Medtronic BiV PPM    His Past Surgical History Is Significant For: Past Surgical History  Procedure Laterality Date  . Anterior fusion cervical spine  2001    for numbness in right arm, left with anhydrosis  . Total hip arthroplasty  2001    right; fell  . Carotid endarterectomy  ~ 2008    left  . Carpal tunnel release      bilaterally  . Insert / replace / remove pacemaker  01/25/12    Medtronic BiV PPM  . Tonsillectomy and adenoidectomy  ~ 1933  . Joint replacement    . Cataract extraction w/ intraocular lens implant  ~2011    left  . Ganglion cyst excision      ? left  His Family History Is Significant For: No family history on file.  His Social History Is Significant For: History   Social History  . Marital Status: Married    Spouse Name: N/A    Number of Children: N/A  . Years of Education: N/A   Social History Main Topics  . Smoking status: Former Smoker -- 1.00 packs/day for 35 years    Types: Cigarettes    Quit date: 08/21/1977  . Smokeless tobacco: Never Used  . Alcohol Use: 12.6 oz/week    14 Glasses of wine, 7 Shots of liquor per week  . Drug Use: None  . Sexually Active: Not Currently   Other Topics Concern  . None   Social History Narrative  . None    His Allergies Are:  Allergies  Allergen Reactions  . Penicillins Rash  . Sulfa Antibiotics Rash  .  Losartan     Hyperkalemia   :   His Current Medications Are:  Outpatient Encounter Prescriptions as of 11/29/2012  Medication Sig Dispense Refill  . aspirin 81 MG tablet Take 81 mg by mouth daily.      . Calcium Carbonate-Vitamin D (CALCIUM-VITAMIN D) 500-200 MG-UNIT per tablet Take 1 tablet by mouth daily.       . furosemide (LASIX) 20 MG tablet 20 mg 2 (two) times daily.       . hydrALAZINE (APRESOLINE) 25 MG tablet TAKE 1 TABLET BY MOUTH 3 TIMES A DAY  90 tablet  5  . Misc Natural Products (OSTEO BI-FLEX ADV DOUBLE ST PO) Take by mouth daily.      . Multiple Vitamin (MULTIVITAMIN) tablet Take 1 tablet by mouth daily.      . naproxen sodium (ANAPROX) 220 MG tablet Take 220 mg by mouth daily as needed. For pain      . Tamsulosin HCl (FLOMAX) 0.4 MG CAPS Take 0.4 mg by mouth 2 (two) times daily.       . [DISCONTINUED] Pseudoeph-Chlorphen-Hydrocod (HYDROCOD/CP/PSEUD PO) Take 1 tablet by mouth every 6 (six) hours.       No facility-administered encounter medications on file as of 11/29/2012.   Review of Systems  Constitutional: Positive for fatigue.  HENT: Positive for hearing loss.   Cardiovascular: Positive for leg swelling.  Musculoskeletal: Positive for arthralgias.  Skin: Positive for rash.  Neurological:       Memory loss  Hematological: Bruises/bleeds easily.  Psychiatric/Behavioral:       Sleepiness    Objective:  Neurologic Exam  Physical Exam Physical Examination:   Filed Vitals:   11/29/12 1327  BP: 134/75  Pulse: 71  Temp: 97.8 F (36.6 C)   He has no orthostatic drop in his blood pressure values or pulse.  General Examination: The patient is a very pleasant 79 y.o. male in no acute distress. He is calm and cooperative with the exam.  HEENT: Normocephalic, atraumatic, pupils are equal, round and reactive to light and accommodation. Funduscopic exam is normal with sharp disc margins noted. Extraocular tracking shows mild saccadic breakdown without nystagmus  noted. Hearing is impaired. Tympanic membranes are clear bilaterally. Face is symmetric with mild facial masking and normal facial sensation. There is no lip, neck or jaw tremor. Neck is moderately rigid with very limited ROM. There are no carotid bruits on auscultation. Oropharynx exam reveals mild mouth dryness. No significant airway crowding is noted. Mallampati is class II. Tongue protrudes centrally and palate elevates symmetrically.    Chest: is clear to auscultation without wheezing, rhonchi or  crackles noted.  Heart: sounds are regular and normal without murmurs, rubs or gallops noted.   Abdomen: is soft, non-tender and non-distended with normal bowel sounds appreciated on auscultation.  Extremities: There is 2+ pitting edema in the distal lower extremities bilaterally.   Skin: is warm and dry with no trophic changes noted.  Musculoskeletal: exam reveals no obvious joint deformities, tenderness or joint swelling or erythema.  Neurologically:  Mental status: The patient is awake and alert, paying good  attention. He is able to completely provide the history. His wife provides details. He is oriented to: person, place, time/date, situation, day of week, month of year and year. His memory, attention, language and knowledge are intact. There is no aphasia, agnosia, apraxia or anomia. There is a no significant degree of bradyphrenia. Speech is mildly hypophonic with no dysarthria noted. Mood is congruent and affect is normal. Fall Assessment Tool Score is 16 which puts him at a high risk for falls.   Cranial nerves are as described above under HEENT exam. In addition, shoulder shrug is normal with equal shoulder height noted.  Motor exam: Normal bulk, and strength for age is noted. Tone is Not rigid with absence of cogwheeling in the extremities. There is overall no bradykinesia. There is no drift or rebound. There is no tremor.  Romberg is not tested. Reflexes are 1+ in the upper extremities  and trace in the lower extremities. Fine motor skills: Finger taps, hand movements, and rapid alternating patting are mildly impaired bilaterally. Foot taps and foot agility are moderately impaired bilaterally.   Cerebellar testing shows no dysmetria or intention tremor on finger to nose testing. There is no truncal or gait ataxia.   Sensory exam is intact to light touch, pinprick, vibration, temperature sense proprioception in the upper extremities, but decrease to light touch, pinprick, vibration sense and temperature sense in the distal lower extremities bilaterally.    Gait, station and balance: He stands up from the seated position with moderate difficulty and needs assistance. No veering to one side is noted. No leaning to one side. Posture is moderately to severely stooped. Stance is wide-based. I did not have him walk for me as he was unstable and only brought his cane which I did not think was safe enough.   Assessment and Plan:   Assessment and Plan:  In summary, STUART MIRABILE is a very pleasant 79 y.o.-year old male with a complex medical history including vascular disease, heart disease, and hypertension. His physical exam is significant for a abnormal stance, abnormal posture, evidence of peripheral neuropathy. Given his history I do believe that he has a multifactorial gait disorder. I explained this to him and also pointed out that there is not one specific test for this or curative treatments. Supportive treatment and secondary prevention of vascular disease as keep. I do agree that he should continue with physical therapy through home health for which he is receiving at this time. I asked him very strongly to give up driving and to use his walker at all times. I do not think he is going to be stable with just using a cane at this point. I suggested some blood work including hemoglobin A1c, thyroid function, inflammatory markers, CK levels and a head CT. We will call him with the test  results. I did not suggest a followup appointment routinely for him and asked him to followup with me on an as-needed basis. He did not like the idea of giving up his  driving but his wife feels that he is not safe driving and I urged him very strongly to give up his driving. I asked him to stay well-hydrated and walk with his walker regularly.  Thank you very much for allowing me to participate in the care of this nice patient. If I can be of any further assistance to you please do not hesitate to call me at [phone].  Sincerely,   Huston Foley, MD, PhD

## 2012-11-29 NOTE — Patient Instructions (Addendum)
I do want to suggest a few things today:  Remember to drink plenty of fluid, eat healthy meals and do not skip any meals. Try to eat protein with a every meal and eat a healthy snack such as fruit or nuts in between meals. Try to keep a regular sleep-wake schedule and try to exercise daily, particularly in the form of walking, 20-30 minutes a day, if you can.   Engage in social activities in your community and with your family and try to keep up with current events by reading the newspaper or watching the news.   As far as your medications are concerned, I would like to suggest no changes.    As far as diagnostic testing: let's do a head CT.  Please use your walker at all times. Please do not drive a car.   I will see you back as needed.   Please also call us for any test results so we can go over those with you on the phone. Brett Canales is my clinical assistant and will answer any of your questions and relay your messages to me and also relay most of my messages to you.  Our phone number is [phone]. We also have an after hours call service for urgent matters and there is a physician on-call for urgent questions. For any emergencies you know to call 911 or go to the nearest emergency room.

## 2012-11-30 LAB — CBC WITH DIFFERENTIAL
Basophils Absolute: 0.1 10*3/uL (ref 0.0–0.2)
Eosinophils Absolute: 0.2 10*3/uL (ref 0.0–0.4)
Immature Granulocytes: 2 % (ref 0–2)
Lymphocytes Absolute: 1.1 10*3/uL (ref 0.7–3.1)
Lymphs: 13 % — ABNORMAL LOW (ref 14–46)
MCHC: 33.4 g/dL (ref 31.5–35.7)
Monocytes: 12 % (ref 4–12)
Platelets: 139 10*3/uL — ABNORMAL LOW (ref 155–379)
RDW: 14.9 % (ref 12.3–15.4)
WBC: 8.4 10*3/uL (ref 3.4–10.8)

## 2012-11-30 LAB — C-REACTIVE PROTEIN: CRP: 0.5 mg/L (ref 0.0–4.9)

## 2012-11-30 LAB — TSH: TSH: 2.91 u[IU]/mL (ref 0.450–4.500)

## 2012-11-30 NOTE — Progress Notes (Signed)
Quick Note:  Please call patient and advise him that his thyroid function looked fine and while his total muscle enzyme level was fine his heart muscle enzyme was borderline elevated. This does not necessarily indicate a problem with his heart but if he has or had any chest pains or shortness of breath he needs to seek medical attention either with his primary care physician or go to the emergency room. Otherwise, he is mildly anemic but this is stable from values that were found in July 2013. I do believe his head CT is still pending. Please ask patient if he has an appointment for his head CT scan. Also reminded him to keep any followup appointments are scheduled. Thanks, Huston Foley, MD, PhD [facility] ([facility]) ______

## 2012-11-30 NOTE — Progress Notes (Signed)
Quick Note:  Spoke with patient and relayed results of blood work as well as Dr Teofilo Pod advice. Patient understood and had no questions.  ______

## 2012-12-03 ENCOUNTER — Telehealth: Payer: Self-pay | Admitting: Neurology

## 2012-12-04 ENCOUNTER — Telehealth: Payer: Self-pay | Admitting: *Deleted

## 2012-12-04 NOTE — Telephone Encounter (Signed)
Spoke with patient regarding his recent lab work and informed him that we did not check his Vit D levels this time.  I told him when his vit. D ws last checked, his level and the provider that ordered the lab.  He has an appt with this same provider (cardiologist) in 3 months and he stated that he will ask then.

## 2012-12-17 ENCOUNTER — Ambulatory Visit
Admission: RE | Admit: 2012-12-17 | Discharge: 2012-12-17 | Disposition: A | Discharge status: Home / Self Care | Payer: Medicare Other | Source: Ambulatory Visit | Attending: Neurology | Admitting: Neurology

## 2012-12-17 DIAGNOSIS — R269 Unspecified abnormalities of gait and mobility: Secondary | ICD-10-CM

## 2012-12-17 DIAGNOSIS — R293 Abnormal posture: Secondary | ICD-10-CM

## 2012-12-17 DIAGNOSIS — M47812 Spondylosis without myelopathy or radiculopathy, cervical region: Secondary | ICD-10-CM

## 2012-12-17 DIAGNOSIS — R296 Repeated falls: Secondary | ICD-10-CM

## 2012-12-17 DIAGNOSIS — R2689 Other abnormalities of gait and mobility: Secondary | ICD-10-CM

## 2012-12-17 DIAGNOSIS — G609 Hereditary and idiopathic neuropathy, unspecified: Secondary | ICD-10-CM

## 2012-12-21 NOTE — Progress Notes (Signed)
Quick Note:  Pls call pt re: CT head: The brain scan showed moderated degree of volume loss which we call atrophy. There were changes in the deeper structures of the brain, which we call white matter changes or microvascular changes. These show up as white spots, that occur with time and are seen in a variety of conditions, including sometimes with normal aging, chronic hypertension, chronic headaches, chronic diabetes, chronic hyperlipidemia. These are not strokes and no mass or lesion was seen which is reassuring. Again, there were no acute findings, such as a stroke, or mass or blood products. These changes may in part explain his gait difficulty. No further action is required on this test at this time, other than re-enforcing the importance of good blood pressure control, good cholesterol control, good blood sugar control, and weight management. Please remind patient to keep any upcoming appointments or tests and to call us with any interim questions, concerns, problems or updates. Thanks,  Huston Foley, MD, PhD    ______

## 2012-12-25 ENCOUNTER — Telehealth: Payer: Self-pay | Admitting: Neurology

## 2012-12-25 NOTE — Telephone Encounter (Signed)
Patient is calling to tell us he's returning our call.  He's not sure why he received a call from [place] unless it would be for his recent test results.

## 2012-12-25 NOTE — Progress Notes (Signed)
Quick Note:  I spoke to patient and relayed CT head results, per Dr. Frances Furbish. Further questions can be answered on follow up appointment. ______

## 2012-12-26 ENCOUNTER — Telehealth: Payer: Self-pay | Admitting: Neurology

## 2012-12-27 NOTE — Telephone Encounter (Signed)
Spoke to patient. Mailed lab results as requested.

## 2013-02-19 ENCOUNTER — Encounter: Payer: Self-pay | Admitting: Internal Medicine

## 2013-02-19 ENCOUNTER — Ambulatory Visit (INDEPENDENT_AMBULATORY_CARE_PROVIDER_SITE_OTHER): Payer: Medicare Other | Admitting: Internal Medicine

## 2013-02-19 VITALS — BP 128/71 | HR 71 | Ht 66.5 in | Wt 152.8 lb

## 2013-02-19 DIAGNOSIS — I428 Other cardiomyopathies: Secondary | ICD-10-CM

## 2013-02-19 DIAGNOSIS — I5022 Chronic systolic (congestive) heart failure: Secondary | ICD-10-CM

## 2013-02-19 DIAGNOSIS — Z950 Presence of cardiac pacemaker: Secondary | ICD-10-CM

## 2013-02-19 LAB — PACEMAKER DEVICE OBSERVATION
AL IMPEDENCE PM: 475 Ohm
AL THRESHOLD: 0.75 V
ATRIAL PACING PM: 43.26
BAMS-0001: 170 {beats}/min
LV LEAD THRESHOLD: 1 V
RV LEAD AMPLITUDE: 8.8 mv
RV LEAD THRESHOLD: 0.5 V

## 2013-02-19 NOTE — Assessment & Plan Note (Signed)
His symptoms remain class II. I've encouraged the patient to increase his physical activity. He remains sedentary. I've also discussed the importance of cessation of alcohol. No change in medical therapy today. He is instructed to maintain a low-sodium diet.

## 2013-02-19 NOTE — Patient Instructions (Addendum)
Your physician wants you to follow-up in: 6 months with Dr Court Joy will receive a reminder letter in the mail two months in advance. If you don't receive a letter, please call our office to schedule the follow-up appointment.   Remote monitoring is used to monitor your Pacemaker or ICD from home. This monitoring reduces the number of office visits required to check your device to one time per year. It allows [place] to keep an eye on the functioning of your device to ensure it is working properly. You are scheduled for a device check from home on 05/27/13. You may send your transmission at any time that day. If you have a wireless device, the transmission will be sent automatically. After your physician reviews your transmission, you will receive a postcard with your next transmission date.

## 2013-02-19 NOTE — Assessment & Plan Note (Signed)
Biventricular pacemaker interrogation demonstrates normal function. His patient activity monitor is improved but still decreased. He is pacing in the ventricle 99% of the time. His fluid index is stable. Thresholds are within normal limits.

## 2013-02-19 NOTE — Progress Notes (Signed)
HPI Phillip Kelley returns today for followup. He is a very pleasant 79 year old man with a nonischemic cardiomyopathy, chronic systolic heart failure, history of alcohol abuse, severe problems with gait and balance, and hypertension. When I saw the patient last several months ago, I recommended that he stop drinking alcohol altogether. In the interim, he stopped for a month, but denied any improvement in his gait, balance, or heart failure symptoms. Presently, he gets short of breath with exertion, and his heart failure symptoms are class IIb. He is also limited by severe back pain and leg pain  Also associated with difficulty in balance. He denies dietary indiscretion with sodium. He has rare palpitations. No syncope.  Allergies  Allergen Reactions  . Penicillins Rash  . Sulfa Antibiotics Rash  . Losartan     Hyperkalemia      Current Outpatient Prescriptions  Medication Sig Dispense Refill  . aspirin 81 MG tablet Take 81 mg by mouth daily.      . Calcium Carbonate-Vitamin D (CALCIUM-VITAMIN D) 500-200 MG-UNIT per tablet Take 1 tablet by mouth daily.       . furosemide (LASIX) 20 MG tablet 20 mg 2 (two) times daily.       . hydrALAZINE (APRESOLINE) 25 MG tablet TAKE 1 TABLET BY MOUTH 3 TIMES A DAY  90 tablet  5  . Misc Natural Products (OSTEO BI-FLEX ADV DOUBLE ST PO) Take by mouth daily.      . Multiple Vitamin (MULTIVITAMIN) tablet Take 1 tablet by mouth daily.      . naproxen sodium (ANAPROX) 220 MG tablet Take 220 mg by mouth daily as needed. For pain      . Tamsulosin HCl (FLOMAX) 0.4 MG CAPS Take 0.4 mg by mouth 2 (two) times daily.        No current facility-administered medications for this visit.     Past Medical History  Diagnosis Date  . Cataract     "right needs taken out" (01/25/12)  . Hypertension   . Chronic systolic CHF (congestive heart failure)   . Symptomatic bradycardia 01/25/12    s/p Medtronic BiV PPM  . Anemia     "my RBC's are low"  . History of blood  transfusion 2001    "w/hip operation"  . Arthritis     "all over"  . Chronic lower back pain     "arthritis"  . Heart block     s/p Medtronic BiV PPM    ROS:   All systems reviewed and negative except as noted in the HPI.   Past Surgical History  Procedure Laterality Date  . Anterior fusion cervical spine  2001    for numbness in right arm, left with anhydrosis  . Total hip arthroplasty  2001    right; fell  . Carotid endarterectomy  ~ 2008    left  . Carpal tunnel release      bilaterally  . Insert / replace / remove pacemaker  01/25/12    Medtronic BiV PPM  . Tonsillectomy and adenoidectomy  ~ 1933  . Joint replacement    . Cataract extraction w/ intraocular lens implant  ~2011    left  . Ganglion cyst excision      ? left     No family history on file.   History   Social History  . Marital Status: Married    Spouse Name: N/A    Number of Children: N/A  . Years of Education: N/A   Occupational History  .  Not on file.   Social History Main Topics  . Smoking status: Former Smoker -- 1.00 packs/day for 35 years    Types: Cigarettes    Quit date: 08/21/1977  . Smokeless tobacco: Never Used  . Alcohol Use: 12.6 oz/week    14 Glasses of wine, 7 Shots of liquor per week  . Drug Use: Not on file  . Sexually Active: Not Currently   Other Topics Concern  . Not on file   Social History Narrative  . No narrative on file     BP 128/71  Pulse 71  Ht 5' 6.5" (1.689 m)  Wt 152 lb 12.8 oz (69.31 kg)  BMI 24.3 kg/m2  Physical Exam:  Chronically ill appearing elderly man, NAD HEENT: Unremarkable Neck:  6 cm JVD, no thyromegally Back:  No CVA tenderness Lungs:  Clear with no wheezes, rales, or rhonchi. HEART:  Regular rate rhythm, no murmurs, no rubs, no clicks Abd:  soft, positive bowel sounds, no organomegally, no rebound, no guarding Ext:  2 plus pulses, no edema, no cyanosis, no clubbing Skin:  No rashes no nodules Neuro:  CN II through XII  intact, motor grossly intact   DEVICE  Normal device function.  See PaceArt for details. His activity level has improved though he is still not back to the level of activity that he had earlier in the year.  Assess/Plan:

## 2013-03-01 ENCOUNTER — Encounter: Payer: Self-pay | Admitting: Neurology

## 2013-03-01 ENCOUNTER — Ambulatory Visit (INDEPENDENT_AMBULATORY_CARE_PROVIDER_SITE_OTHER): Payer: Medicare Other | Admitting: Neurology

## 2013-03-01 VITALS — BP 161/81 | HR 70 | Temp 97.0°F | Ht 66.0 in

## 2013-03-01 DIAGNOSIS — R296 Repeated falls: Secondary | ICD-10-CM

## 2013-03-01 DIAGNOSIS — R269 Unspecified abnormalities of gait and mobility: Secondary | ICD-10-CM

## 2013-03-01 DIAGNOSIS — R2689 Other abnormalities of gait and mobility: Secondary | ICD-10-CM

## 2013-03-01 DIAGNOSIS — G609 Hereditary and idiopathic neuropathy, unspecified: Secondary | ICD-10-CM

## 2013-03-01 DIAGNOSIS — R29818 Other symptoms and signs involving the nervous system: Secondary | ICD-10-CM

## 2013-03-01 DIAGNOSIS — Z9181 History of falling: Secondary | ICD-10-CM

## 2013-03-01 DIAGNOSIS — M47812 Spondylosis without myelopathy or radiculopathy, cervical region: Secondary | ICD-10-CM

## 2013-03-01 DIAGNOSIS — R293 Abnormal posture: Secondary | ICD-10-CM

## 2013-03-01 NOTE — Progress Notes (Signed)
Subjective:    Patient ID: Phillip Kelley is a 79 y.o. male.  HPI  Interim history:   Phillip Kelley is a very pleasant 79 year old right-handed gentleman with an underlying medical history of hypertension, heart block status post pacemaker placement, status post left carotid endarterectomy in 2006, s/p neck fusion, prostate hypertrophy, chronic shoulder pain, and insomnia, who presents for followup consultation of his gait disorder. He is accompanied by his wife again today. I first met him on 11/29/2012, which time I felt he most likely has a multi-factorial gait disorder primarily due to his vascular risk factors, evidence of neuropathy, evidence of low back pain and d/t abnormal upper back curvature. I suggested blood work and head CT. His CTH from 12/18/12 showed: Moderate perisylvian and mesial temporal atrophy. 2. Moderate periventricular and subcortical chronic small vessel ischemic disease. 3. Calcifications of bilateral vertebral and internal carotid arteries. 4. No acute findings. This was reviewed with him and his wife.  His current medications are baby aspirin, calcium, Lasix, glucosamine-chondroitin, hydralazine, Vicodin as needed, Linzess, Flomax, multivitamin and tramadol. He fell at home in April and injured his right knee. He had a steroid injection at his orthopedics office. He also had injection to his shoulder for chronic shoulder pain. He felt that his shoulder pain exacerbated after his fall. He is having PT at home 2/week. He has no new complaints. He still drives some. I had advised him to use his walker at all times and he states that because of the long gravel driveway outside his house he is not able to use his walker outside. He uses a walker inside the house and 2 canes outside the house.   His Past Medical History Is Significant For: Past Medical History  Diagnosis Date  . Cataract     "right needs taken out" (01/25/12)  . Hypertension   . Chronic systolic CHF (congestive  heart failure)   . Symptomatic bradycardia 01/25/12    s/p Medtronic BiV PPM  . Anemia     "my RBC's are low"  . History of blood transfusion 2001    "w/hip operation"  . Arthritis     "all over"  . Chronic lower back pain     "arthritis"  . Heart block     s/p Medtronic BiV PPM    His Past Surgical History Is Significant For: Past Surgical History  Procedure Laterality Date  . Anterior fusion cervical spine  2001    for numbness in right arm, left with anhydrosis  . Total hip arthroplasty  2001    right; fell  . Carotid endarterectomy  ~ 2008    left  . Carpal tunnel release      bilaterally  . Insert / replace / remove pacemaker  01/25/12    Medtronic BiV PPM  . Tonsillectomy and adenoidectomy  ~ 1933  . Joint replacement    . Cataract extraction w/ intraocular lens implant  ~2011    left  . Ganglion cyst excision      ? left    His Family History Is Significant For: No family history on file.  His Social History Is Significant For: History   Social History  . Marital Status: Married    Spouse Name: N/A    Number of Children: N/A  . Years of Education: N/A   Social History Main Topics  . Smoking status: Former Smoker -- 1.00 packs/day for 35 years    Types: Cigarettes    Quit date: 08/21/1977  .  Smokeless tobacco: Never Used  . Alcohol Use: 12.6 oz/week    14 Glasses of wine, 7 Shots of liquor per week  . Drug Use: None  . Sexual Activity: Not Currently   Other Topics Concern  . None   Social History Narrative  . None    His Allergies Are:  Allergies  Allergen Reactions  . Penicillins Rash  . Sulfa Antibiotics Rash  . Losartan     Hyperkalemia   :   His Current Medications Are:  Outpatient Encounter Prescriptions as of 03/01/2013  Medication Sig Dispense Refill  . aspirin 81 MG tablet Take 81 mg by mouth daily.      . Calcium Carbonate-Vitamin D (CALCIUM-VITAMIN D) 500-200 MG-UNIT per tablet Take 1 tablet by mouth daily.       .  furosemide (LASIX) 20 MG tablet 20 mg 2 (two) times daily.       . hydrALAZINE (APRESOLINE) 25 MG tablet TAKE 1 TABLET BY MOUTH 3 TIMES A DAY  90 tablet  5  . Misc Natural Products (OSTEO BI-FLEX ADV DOUBLE ST PO) Take by mouth daily.      . Multiple Vitamin (MULTIVITAMIN) tablet Take 1 tablet by mouth daily.      . naproxen sodium (ANAPROX) 220 MG tablet Take 220 mg by mouth daily as needed. For pain      . Tamsulosin HCl (FLOMAX) 0.4 MG CAPS Take 0.4 mg by mouth 2 (two) times daily.        No facility-administered encounter medications on file as of 03/01/2013.    Review of Systems  All other systems reviewed and are negative.    Objective:  Neurologic Exam  Physical Exam Physical Examination:   Filed Vitals:   03/01/13 1217  BP: 161/81  Pulse: 70  Temp: 97 F (36.1 C)    General Examination: The patient is a very pleasant 79 y.o. male in no acute distress. He appears frail. He is well groomed and is situated in a WC.   HEENT: Normocephalic, atraumatic, pupils are equal, round and reactive to light and accommodation. Extraocular tracking shows mild saccadic breakdown without nystagmus noted. Hearing is impaired. Face is symmetric with mild facial masking and normal facial sensation. There is no lip, neck or jaw tremor. Neck is moderately rigid with very limited ROM. There are no carotid bruits on auscultation. Oropharynx exam reveals mild mouth dryness. No significant airway crowding is noted. Mallampati is class II. Tongue protrudes centrally and palate elevates symmetrically.    Chest: is clear to auscultation without wheezing, rhonchi or crackles noted.  Heart: sounds are regular and normal without murmurs, rubs or gallops noted.   Abdomen: is soft, non-tender and non-distended with normal bowel sounds appreciated on auscultation.  Extremities: There is 2+ pitting edema in the distal lower extremities bilaterally. He has multiple    Skin: is warm and dry with no trophic  changes noted.  Musculoskeletal: exam reveals no obvious joint deformities, tenderness or joint swelling or erythema.  Neurologically:  Mental status: The patient is awake and alert, paying good  attention. He is able to completely provide the history. His wife provides details. He is oriented to: person, place, time/date, situation, day of week, month of year and year. His memory, attention, language and knowledge are intact. There is no aphasia, agnosia, apraxia or anomia. There is a no significant degree of bradyphrenia. Speech is mildly hypophonic with no dysarthria noted. Mood is congruent and affect is normal. Fall Assessment Tool Score  is 32 which puts him at a high risk for falls.   Cranial nerves are as described above under HEENT exam. In addition, shoulder shrug is normal with equal shoulder height noted.  Motor exam: Thin bulk, and normal strength for age is noted. Tone is Not rigid with absence of cogwheeling in the extremities. There is overall no bradykinesia. There is no drift or rebound. There is no tremor.  Romberg is not tested. Reflexes are 1+ in the upper extremities and trace in the lower extremities. Fine motor skills: Finger taps, hand movements, and rapid alternating patting are mildly impaired bilaterally. Foot taps and foot agility are moderately impaired bilaterally.   Cerebellar testing shows no dysmetria or intention tremor on finger to nose testing. There is no truncal or gait ataxia.   Sensory exam is intact to light touch, pinprick, vibration, temperature sense proprioception in the upper extremities, but decrease to light touch, pinprick, vibration sense and temperature sense in the distal lower extremities bilaterally.    Gait, station and balance: He stands up from the seated position with moderate difficulty and needs assistance. No veering to one side is noted. No leaning to one side. Posture is moderately to severely stooped, especially with significant upper  back kyphosis. Stance is wide-based. He walked with difficulty and has genu varus bilaterally and used 2 canes.    Assessment and Plan:   In summary, DEEPAK BLESS is a very pleasant 79 y.o.-year old male with a complex medical history including vascular disease, heart disease, and hypertension. His physical exam is significant for a abnormal stance, abnormal posture, evidence of peripheral neuropathy. He most likely has a multifactorial gait disorder. I explained this to him and also pointed out that there is not one specific test for this or curative treatments. Supportive treatment and secondary prevention of vascular disease.  I do agree that he should continue with physical therapy through home health for which he is receiving at this time. I asked him very strongly to give up driving and to use his walker at all times. I do not think he is going to be stable with just using 2 canes at this point. I suggested no additional workup today and we talked about his recent blood work and head CT findings. I did not suggest a followup appointment routinely for him and asked him to followup with me on an as-needed basis. He did not like the idea of giving up his driving but his wife feels that he is not safe driving and I urged him very strongly to give up his driving. I asked him to stay well-hydrated and walk with his walker regularly. He is at fall risk. He and his wife answered and understanding.

## 2013-03-01 NOTE — Patient Instructions (Addendum)
Please continue physical therapy. Your balance and gait disorder is a function of multiple problems.

## 2013-04-04 ENCOUNTER — Other Ambulatory Visit: Payer: Self-pay | Admitting: Internal Medicine

## 2013-05-02 ENCOUNTER — Other Ambulatory Visit: Payer: Self-pay

## 2013-05-02 MED ORDER — HYDRALAZINE HCL 25 MG PO TABS: ORAL_TABLET | ORAL | Status: DC

## 2013-05-27 ENCOUNTER — Ambulatory Visit (INDEPENDENT_AMBULATORY_CARE_PROVIDER_SITE_OTHER): Payer: Medicare Other | Admitting: *Deleted

## 2013-05-27 DIAGNOSIS — I428 Other cardiomyopathies: Secondary | ICD-10-CM

## 2013-05-27 DIAGNOSIS — Z950 Presence of cardiac pacemaker: Secondary | ICD-10-CM

## 2013-05-27 DIAGNOSIS — I5022 Chronic systolic (congestive) heart failure: Secondary | ICD-10-CM

## 2013-06-03 ENCOUNTER — Telehealth: Payer: Self-pay | Admitting: Internal Medicine

## 2013-06-03 NOTE — Telephone Encounter (Signed)
Transmission was not received, wife aware.  She will resend later this evening.

## 2013-06-03 NOTE — Telephone Encounter (Signed)
New message     Did you call pt because he did not transmit on 11-10?  If yes, he transmitted on 11-12.  Did you get it?

## 2013-06-04 ENCOUNTER — Other Ambulatory Visit: Payer: Self-pay | Admitting: Dermatology

## 2013-06-05 ENCOUNTER — Encounter: Payer: Self-pay | Admitting: Internal Medicine

## 2013-06-06 LAB — MDC_IDC_ENUM_SESS_TYPE_REMOTE
Battery Remaining Longevity: 64 mo
Battery Voltage: 3.01 V
Brady Statistic AS VS Percent: 1.3 %
Brady Statistic RV Percent Paced: 98.6 %
Lead Channel Impedance Value: 285 Ohm
Lead Channel Impedance Value: 475 Ohm
Lead Channel Impedance Value: 513 Ohm
Lead Channel Pacing Threshold Amplitude: 0.5 V
Lead Channel Pacing Threshold Amplitude: 1.125 V
Lead Channel Pacing Threshold Pulse Width: 0.4 ms
Lead Channel Pacing Threshold Pulse Width: 0.4 ms
Lead Channel Sensing Intrinsic Amplitude: 1.5 mV
Lead Channel Sensing Intrinsic Amplitude: 9.125 mV
Lead Channel Setting Pacing Amplitude: 2 V
Lead Channel Setting Pacing Pulse Width: 0.4 ms
Lead Channel Setting Pacing Pulse Width: 0.4 ms
Lead Channel Setting Sensing Sensitivity: 0.9 mV
Zone Setting Detection Interval: 400 ms

## 2013-06-11 ENCOUNTER — Telehealth: Payer: Self-pay | Admitting: Internal Medicine

## 2013-06-11 NOTE — Telephone Encounter (Signed)
N/A---transmission was received/kwm

## 2013-06-11 NOTE — Telephone Encounter (Signed)
New message     Pt want to know if we received his pacer transmisson from last Wednesday.

## 2013-06-12 NOTE — Telephone Encounter (Signed)
Follow up    FYI Pt saw that someone from here had called him.  I told him that we called him to say that his transmission was received.

## 2013-06-18 ENCOUNTER — Telehealth: Payer: Self-pay | Admitting: *Deleted

## 2013-06-18 NOTE — Telephone Encounter (Signed)
LMOM for return call. Per GT--pt to increase Furosemide 20mg  to 40 mg qam and 20mg  qpm x 4 days.

## 2013-06-19 NOTE — Telephone Encounter (Signed)
Pt returned call in regards to increase Furosemide/kwm

## 2013-07-31 NOTE — Telephone Encounter (Signed)
Encounter is complete 

## 2013-08-27 ENCOUNTER — Encounter: Payer: Self-pay | Admitting: *Deleted

## 2013-09-12 ENCOUNTER — Other Ambulatory Visit: Payer: Self-pay | Admitting: Dermatology

## 2013-09-16 ENCOUNTER — Ambulatory Visit: Payer: Medicare Other | Admitting: Podiatry

## 2013-09-19 ENCOUNTER — Encounter: Payer: Self-pay | Admitting: Podiatry

## 2013-09-19 ENCOUNTER — Ambulatory Visit (INDEPENDENT_AMBULATORY_CARE_PROVIDER_SITE_OTHER): Payer: Medicare Other | Admitting: Podiatry

## 2013-09-19 VITALS — BP 152/76 | HR 70 | Resp 12

## 2013-09-19 DIAGNOSIS — B351 Tinea unguium: Secondary | ICD-10-CM

## 2013-09-19 DIAGNOSIS — M79609 Pain in unspecified limb: Secondary | ICD-10-CM

## 2013-09-19 DIAGNOSIS — L84 Corns and callosities: Secondary | ICD-10-CM

## 2013-09-19 NOTE — Progress Notes (Signed)
   Subjective:    Patient ID: Phillip Kelley, male    DOB: 1927-04-23, 80 y.o.   MRN: [id number]  HPI '' B/L  TOES ARE THICK AND SORE FOR MORE THAN 6 MONTHS. THE TOES GET AGGRAVATED BY CLOSED SHOES AND PUTTING PRESSURE AND ITS GETTING WORSE. BUT TRIED NO TREATMENT.''    Review of Systems  Constitutional: Positive for fatigue.  HENT: Positive for hearing loss.   Respiratory: Positive for cough.   Cardiovascular: Positive for leg swelling.  Genitourinary: Positive for frequency.  Musculoskeletal: Positive for back pain and gait problem.  Skin: Positive for rash and wound.  Neurological: Positive for dizziness and weakness.  Hematological: Bruises/bleeds easily.  All other systems reviewed and are negative.       Objective:   Physical Exam        Assessment & Plan:

## 2013-09-20 ENCOUNTER — Telehealth: Payer: Self-pay | Admitting: Internal Medicine

## 2013-09-20 NOTE — Telephone Encounter (Signed)
LMOM for return call//kwm  

## 2013-09-20 NOTE — Telephone Encounter (Signed)
New problem   Pt need to speak to someone concerning having his device checked in office.  He wanted to know why he need to come in office. Please call pt.

## 2013-09-20 NOTE — Progress Notes (Signed)
Subjective:     Patient ID: Phillip Kelley, male   DOB: 07/26/1926, 80 y.o.   MRN: [id number]  HPI patient presents with caregiver with nail disease 1-5 both feet and painful lesions on the distal portion of third and fourth toes right which makes walking difficult and have become thicker as years have gone by   Review of Systems  All other systems reviewed and are negative.       Objective:   Physical Exam  Nursing note and vitals reviewed. Constitutional: He is oriented to person, place, and time.  Cardiovascular: Intact distal pulses.   Musculoskeletal: Normal range of motion.  Neurological: He is oriented to person, place, and time.  Skin: Skin is dry.   neurovascular status is diminished secondary to age and deformity. I noted there to be diminished range of motion of the subtalar and midtarsal joint mild equinus and diminished muscle strength. I found that the nails are thickened 1-5 both feet and there is severe distal keratotic lesions on the third and fourth toes right that are painful when pressed    Assessment:     Hammertoe deformity third and fourth digits right and nail disease 1-5 both feet that are painful and impossible for patient to cut    Plan:     Initial H&P performed and today debridement of lesions was accomplished with buttress padding and debrided nailbeds 1-5 both feet with no bleeding noted. Reappoint in 3 months or earlier if needed

## 2013-09-23 ENCOUNTER — Telehealth: Payer: Self-pay | Admitting: *Deleted

## 2013-09-23 NOTE — Telephone Encounter (Signed)
Pt states had calloused trimmed on 09/19/2013 and the dressing and "gizmo" DR Charlsie Merles put on is still in place.  Pt states hurt last night.  I reviewed pt's last visit notes, he has on a buttress pad and dressing.  I told the pt to remove the "gizmo" that our office calls a buttress pad and dressing.  I told him to only wear the buttress pad when he was not in his sock and shoes, not when sleeping.  I told him if he had any broken skin to use an antibiotic ointment dressing daily until healed.  I encouraged the pt to call if redness, swelling or discharge/pus.  Pt states understanding.

## 2013-09-26 NOTE — Telephone Encounter (Signed)
Pt called back re appt, talked to brandon and said it was hard to get around, pls call

## 2013-09-26 NOTE — Telephone Encounter (Signed)
LMOVM w/ my direct line. 

## 2013-09-27 NOTE — Telephone Encounter (Signed)
Called number listed, no answer or VM.

## 2013-09-30 ENCOUNTER — Ambulatory Visit (INDEPENDENT_AMBULATORY_CARE_PROVIDER_SITE_OTHER): Payer: Medicare Other

## 2013-09-30 DIAGNOSIS — I5022 Chronic systolic (congestive) heart failure: Secondary | ICD-10-CM

## 2013-09-30 DIAGNOSIS — I428 Other cardiomyopathies: Secondary | ICD-10-CM

## 2013-09-30 LAB — MDC_IDC_ENUM_SESS_TYPE_REMOTE
Battery Remaining Longevity: 63 mo
Brady Statistic AP VS Percent: 0.07 %
Brady Statistic AS VP Percent: 54.29 %
Brady Statistic AS VS Percent: 0.97 %
Brady Statistic RA Percent Paced: 44.74 %
Date Time Interrogation Session: 20150316232046
Lead Channel Impedance Value: 304 Ohm
Lead Channel Impedance Value: 323 Ohm
Lead Channel Impedance Value: 399 Ohm
Lead Channel Impedance Value: 418 Ohm
Lead Channel Impedance Value: 494 Ohm
Lead Channel Impedance Value: 532 Ohm
Lead Channel Pacing Threshold Pulse Width: 0.4 ms
Lead Channel Pacing Threshold Pulse Width: 0.4 ms
Lead Channel Pacing Threshold Pulse Width: 0.4 ms
Lead Channel Sensing Intrinsic Amplitude: 1.5 mV
Lead Channel Setting Pacing Amplitude: 1.5 V
Lead Channel Setting Pacing Amplitude: 2.5 V
MDC IDC MSMT BATTERY VOLTAGE: 3.01 V
MDC IDC MSMT LEADCHNL LV IMPEDANCE VALUE: 437 Ohm
MDC IDC MSMT LEADCHNL LV IMPEDANCE VALUE: 589 Ohm
MDC IDC MSMT LEADCHNL LV PACING THRESHOLD AMPLITUDE: 1 V
MDC IDC MSMT LEADCHNL RA PACING THRESHOLD AMPLITUDE: 0.625 V
MDC IDC MSMT LEADCHNL RV IMPEDANCE VALUE: 437 Ohm
MDC IDC MSMT LEADCHNL RV PACING THRESHOLD AMPLITUDE: 0.5 V
MDC IDC MSMT LEADCHNL RV SENSING INTR AMPL: 9.5 mV
MDC IDC SET LEADCHNL LV PACING PULSEWIDTH: 0.4 ms
MDC IDC SET LEADCHNL RA PACING AMPLITUDE: 2 V
MDC IDC SET LEADCHNL RV PACING PULSEWIDTH: 0.4 ms
MDC IDC SET LEADCHNL RV SENSING SENSITIVITY: 0.9 mV
MDC IDC SET ZONE DETECTION INTERVAL: 400 ms
MDC IDC STAT BRADY AP VP PERCENT: 44.67 %
MDC IDC STAT BRADY RV PERCENT PACED: 98.95 %
Zone Setting Detection Interval: 350 ms

## 2013-09-30 NOTE — Telephone Encounter (Signed)
Left another VM w/ my direct #

## 2013-10-03 NOTE — Telephone Encounter (Signed)
Per remote, pt instructed to take extra lasix x3 days due to abnormal transthoracic impedance since late January/2015. Pt VERY reluctant to take extra lasix. Pt concerned we are inconsistent with diagnosis & follow up. I repeated to patient that his device has been recording increased fluid retention around his heart. Pt states his device has never improved his SOB, swollen feet, or energy level. I explained to pt several times that unless the fluid comes off via home treatment he can potentially be hospitalized where the fluid will be taken off. Pt states he will "schedule his bucket time" next week to take extra lasix.   ROV w/ Dr. [name] 10/31/13 @ 11:45.

## 2013-10-14 ENCOUNTER — Other Ambulatory Visit: Payer: Self-pay | Admitting: Internal Medicine

## 2013-10-14 NOTE — Progress Notes (Signed)
PPM remote with ICM. 

## 2013-10-16 ENCOUNTER — Encounter: Payer: Self-pay | Admitting: Internal Medicine

## 2013-10-31 ENCOUNTER — Encounter: Payer: Self-pay | Admitting: Internal Medicine

## 2013-10-31 ENCOUNTER — Ambulatory Visit (INDEPENDENT_AMBULATORY_CARE_PROVIDER_SITE_OTHER): Payer: Medicare Other | Admitting: Internal Medicine

## 2013-10-31 VITALS — BP 149/78 | HR 70 | Ht 62.0 in | Wt 150.0 lb

## 2013-10-31 DIAGNOSIS — I428 Other cardiomyopathies: Secondary | ICD-10-CM

## 2013-10-31 DIAGNOSIS — I5022 Chronic systolic (congestive) heart failure: Secondary | ICD-10-CM

## 2013-10-31 LAB — MDC_IDC_ENUM_SESS_TYPE_INCLINIC
Battery Voltage: 3.01 V
Brady Statistic AP VP Percent: 38.02 %
Brady Statistic AS VP Percent: 60.81 %
Brady Statistic AS VS Percent: 1.09 %
Brady Statistic RA Percent Paced: 38.11 %
Brady Statistic RV Percent Paced: 98.83 %
Lead Channel Impedance Value: 285 Ohm
Lead Channel Impedance Value: 323 Ohm
Lead Channel Impedance Value: 456 Ohm
Lead Channel Impedance Value: 570 Ohm
Lead Channel Pacing Threshold Amplitude: 0.5 V
Lead Channel Pacing Threshold Pulse Width: 0.4 ms
Lead Channel Pacing Threshold Pulse Width: 0.4 ms
Lead Channel Setting Pacing Amplitude: 1.5 V
Lead Channel Setting Pacing Amplitude: 2 V
Lead Channel Setting Pacing Amplitude: 2.5 V
Lead Channel Setting Pacing Pulse Width: 0.4 ms
Lead Channel Setting Sensing Sensitivity: 0.9 mV
MDC IDC MSMT BATTERY REMAINING LONGEVITY: 63 mo
MDC IDC MSMT LEADCHNL LV IMPEDANCE VALUE: 380 Ohm
MDC IDC MSMT LEADCHNL LV IMPEDANCE VALUE: 475 Ohm
MDC IDC MSMT LEADCHNL LV IMPEDANCE VALUE: 608 Ohm
MDC IDC MSMT LEADCHNL LV PACING THRESHOLD AMPLITUDE: 1 V
MDC IDC MSMT LEADCHNL LV PACING THRESHOLD PULSEWIDTH: 0.4 ms
MDC IDC MSMT LEADCHNL RA PACING THRESHOLD AMPLITUDE: 0.875 V
MDC IDC MSMT LEADCHNL RA SENSING INTR AMPL: 1.375 mV
MDC IDC MSMT LEADCHNL RV IMPEDANCE VALUE: 437 Ohm
MDC IDC MSMT LEADCHNL RV IMPEDANCE VALUE: 494 Ohm
MDC IDC MSMT LEADCHNL RV SENSING INTR AMPL: 9.125 mV
MDC IDC SESS DTM: 20150416142633
MDC IDC SET LEADCHNL RV PACING PULSEWIDTH: 0.4 ms
MDC IDC SET ZONE DETECTION INTERVAL: 350 ms
MDC IDC SET ZONE DETECTION INTERVAL: 400 ms
MDC IDC STAT BRADY AP VS PERCENT: 0.08 %

## 2013-10-31 NOTE — Progress Notes (Signed)
HPI Mr. Uphoff returns today for followup. He is a very pleasant 80 year old man with a nonischemic cardiomyopathy, chronic systolic heart failure, history of alcohol abuse, severe problems with gait and balance, and hypertension. When I saw the patient last several months ago, he had persistent and chronic weakness and fatigue. This has persisted. He has had problems with shingles and had severe pain which has gradually resolved.  He denies dietary indiscretion with sodium. He has rare palpitations. No syncope. He uses a walker. Allergies  Allergen Reactions  . Penicillins Rash  . Sulfa Antibiotics Rash  . Losartan     Hyperkalemia      Current Outpatient Prescriptions  Medication Sig Dispense Refill  . Calcium Carbonate-Vitamin D (CALCIUM-VITAMIN D) 500-200 MG-UNIT per tablet Take 1 tablet by mouth daily.       . furosemide (LASIX) 20 MG tablet TAKE 1 TABLET (20 MG TOTAL) BY MOUTH 2 (TWO) TIMES DAILY.  180 tablet  0  . hydrALAZINE (APRESOLINE) 25 MG tablet TAKE 1 TABLET BY MOUTH 3 TIMES A DAY  270 tablet  2  . Misc Natural Products (OSTEO BI-FLEX ADV DOUBLE ST PO) Take by mouth daily.      . Multiple Vitamin (MULTIVITAMIN) tablet Take 1 tablet by mouth daily.      . mupirocin ointment (BACTROBAN) 2 %       . Tamsulosin HCl (FLOMAX) 0.4 MG CAPS Take 0.4 mg by mouth 2 (two) times daily.       Carlena Hurl 15 MG TABS tablet daily.       No current facility-administered medications for this visit.     Past Medical History  Diagnosis Date  . Cataract     "right needs taken out" (01/25/12)  . Hypertension   . Chronic systolic CHF (congestive heart failure)   . Symptomatic bradycardia 01/25/12    s/p Medtronic BiV PPM  . Anemia     "my RBC's are low"  . History of blood transfusion 2001    "w/hip operation"  . Arthritis     "all over"  . Chronic lower back pain     "arthritis"  . Heart block     s/p Medtronic BiV PPM    ROS:   All systems reviewed and negative except as noted  in the HPI.   Past Surgical History  Procedure Laterality Date  . Anterior fusion cervical spine  2001    for numbness in right arm, left with anhydrosis  . Total hip arthroplasty  2001    right; fell  . Carotid endarterectomy  ~ 2008    left  . Carpal tunnel release      bilaterally  . Insert / replace / remove pacemaker  01/25/12    Medtronic BiV PPM  . Tonsillectomy and adenoidectomy  ~ 1933  . Joint replacement    . Cataract extraction w/ intraocular lens implant  ~2011    left  . Ganglion cyst excision      ? left     No family history on file.   History   Social History  . Marital Status: Married    Spouse Name: N/A    Number of Children: N/A  . Years of Education: N/A   Occupational History  . Not on file.   Social History Main Topics  . Smoking status: Former Smoker -- 1.00 packs/day for 35 years    Types: Cigarettes    Quit date: 08/21/1977  . Smokeless tobacco: Never Used  .  Alcohol Use: 12.6 oz/week    14 Glasses of wine, 7 Shots of liquor per week  . Drug Use: Not on file  . Sexual Activity: Not Currently   Other Topics Concern  . Not on file   Social History Narrative  . No narrative on file     BP 149/78  Pulse 70  Ht 5\' 2"  (1.575 m)  Wt 150 lb (68.04 kg)  BMI 27.43 kg/m2  Physical Exam:  Chronically ill appearing elderly man, NAD HEENT: Unremarkable Neck:  6 cm JVD, no thyromegally Back:  No CVA tenderness Lungs:  Clear with no wheezes, rales, or rhonchi. HEART:  Regular rate rhythm, no murmurs, no rubs, no clicks Abd:  soft, positive bowel sounds, no organomegally, no rebound, no guarding, scabbing, weaping erythmatous lesion Ext:  2 plus pulses, no edema, no cyanosis, no clubbing Skin:  No rashes no nodules Neuro:  CN II through XII intact, motor grossly intact   DEVICE  Normal device function.  See PaceArt for details. His activity level has improved though he is still not back to the level of activity that he had earlier  in the year.  Assess/Plan:

## 2013-10-31 NOTE — Assessment & Plan Note (Signed)
His symptoms remain class 2b/3A. He will continue his current meds. I have encouraged the patient to increase his physical activity and suggested a stationary bike.

## 2013-10-31 NOTE — Patient Instructions (Signed)
Your physician wants you to follow-up in:6 months with Dr Court Joy will receive a reminder letter in the mail two months in advance. If you don't receive a letter, please call our office to schedule the follow-up appointment.   Remote monitoring is used to monitor your Pacemaker or ICD from home. This monitoring reduces the number of office visits required to check your device to one time per year. It allows [place] to keep an eye on the functioning of your device to ensure it is working properly. You are scheduled for a device check from home on 01/30/14. You may send your transmission at any time that day. If you have a wireless device, the transmission will be sent automatically. After your physician reviews your transmission, you will receive a postcard with your next transmission date.

## 2013-11-05 ENCOUNTER — Encounter: Payer: Medicare Other | Admitting: Internal Medicine

## 2014-01-30 DIAGNOSIS — I428 Other cardiomyopathies: Secondary | ICD-10-CM

## 2014-01-30 DIAGNOSIS — I5022 Chronic systolic (congestive) heart failure: Secondary | ICD-10-CM

## 2014-02-03 ENCOUNTER — Ambulatory Visit (INDEPENDENT_AMBULATORY_CARE_PROVIDER_SITE_OTHER): Payer: Medicare Other | Admitting: *Deleted

## 2014-02-03 DIAGNOSIS — I5022 Chronic systolic (congestive) heart failure: Secondary | ICD-10-CM

## 2014-02-03 DIAGNOSIS — I428 Other cardiomyopathies: Secondary | ICD-10-CM

## 2014-02-03 NOTE — Progress Notes (Signed)
Remote pacemaker transmission.   

## 2014-02-07 ENCOUNTER — Other Ambulatory Visit: Payer: Self-pay | Admitting: Internal Medicine

## 2014-02-10 LAB — MDC_IDC_ENUM_SESS_TYPE_REMOTE
Battery Remaining Longevity: 60 mo
Battery Voltage: 3.01 V
Brady Statistic AP VP Percent: 92.53 %
Brady Statistic AP VS Percent: 0 %
Brady Statistic AS VP Percent: 7.47 %
Brady Statistic RV Percent Paced: 100 %
Lead Channel Impedance Value: 285 Ohm
Lead Channel Impedance Value: 342 Ohm
Lead Channel Impedance Value: 380 Ohm
Lead Channel Impedance Value: 437 Ohm
Lead Channel Impedance Value: 437 Ohm
Lead Channel Impedance Value: 437 Ohm
Lead Channel Impedance Value: 513 Ohm
Lead Channel Impedance Value: 532 Ohm
Lead Channel Impedance Value: 589 Ohm
Lead Channel Pacing Threshold Amplitude: 0.5 V
Lead Channel Pacing Threshold Amplitude: 0.75 V
Lead Channel Pacing Threshold Amplitude: 1.125 V
Lead Channel Pacing Threshold Pulse Width: 0.4 ms
Lead Channel Pacing Threshold Pulse Width: 0.4 ms
Lead Channel Sensing Intrinsic Amplitude: 10.375 mV
Lead Channel Setting Pacing Amplitude: 2 V
Lead Channel Setting Pacing Amplitude: 2.5 V
Lead Channel Setting Pacing Pulse Width: 0.4 ms
Lead Channel Setting Pacing Pulse Width: 0.4 ms
MDC IDC MSMT LEADCHNL RA SENSING INTR AMPL: 1.5 mV
MDC IDC MSMT LEADCHNL RV PACING THRESHOLD PULSEWIDTH: 0.4 ms
MDC IDC SESS DTM: 20150717015519
MDC IDC SET LEADCHNL LV PACING AMPLITUDE: 1.75 V
MDC IDC SET LEADCHNL RV SENSING SENSITIVITY: 0.9 mV
MDC IDC STAT BRADY AS VS PERCENT: 0 %
MDC IDC STAT BRADY RA PERCENT PACED: 92.53 %
Zone Setting Detection Interval: 350 ms
Zone Setting Detection Interval: 400 ms

## 2014-02-21 ENCOUNTER — Encounter: Payer: Self-pay | Admitting: Cardiology

## 2014-03-05 ENCOUNTER — Other Ambulatory Visit: Payer: Self-pay | Admitting: Dermatology

## 2014-03-19 ENCOUNTER — Encounter: Payer: Self-pay | Admitting: Internal Medicine

## 2014-05-07 ENCOUNTER — Encounter: Payer: Self-pay | Admitting: Internal Medicine

## 2014-05-07 ENCOUNTER — Ambulatory Visit (INDEPENDENT_AMBULATORY_CARE_PROVIDER_SITE_OTHER): Payer: Medicare Other | Admitting: Internal Medicine

## 2014-05-07 VITALS — BP 121/65 | HR 71 | Ht 62.0 in | Wt 148.6 lb

## 2014-05-07 DIAGNOSIS — Z45018 Encounter for adjustment and management of other part of cardiac pacemaker: Secondary | ICD-10-CM

## 2014-05-07 DIAGNOSIS — I5022 Chronic systolic (congestive) heart failure: Secondary | ICD-10-CM

## 2014-05-07 DIAGNOSIS — I480 Paroxysmal atrial fibrillation: Secondary | ICD-10-CM

## 2014-05-07 DIAGNOSIS — Z950 Presence of cardiac pacemaker: Secondary | ICD-10-CM

## 2014-05-07 DIAGNOSIS — I4891 Unspecified atrial fibrillation: Secondary | ICD-10-CM | POA: Insufficient documentation

## 2014-05-07 LAB — MDC_IDC_ENUM_SESS_TYPE_INCLINIC
Brady Statistic AP VP Percent: 59.89 %
Brady Statistic AP VS Percent: 0.09 %
Brady Statistic AS VS Percent: 0.53 %
Brady Statistic RV Percent Paced: 99.39 %
Lead Channel Impedance Value: 266 Ohm
Lead Channel Impedance Value: 342 Ohm
Lead Channel Impedance Value: 361 Ohm
Lead Channel Impedance Value: 437 Ohm
Lead Channel Impedance Value: 437 Ohm
Lead Channel Impedance Value: 475 Ohm
Lead Channel Impedance Value: 513 Ohm
Lead Channel Impedance Value: 570 Ohm
Lead Channel Pacing Threshold Amplitude: 0.5 V
Lead Channel Pacing Threshold Amplitude: 0.75 V
Lead Channel Pacing Threshold Pulse Width: 0.4 ms
Lead Channel Sensing Intrinsic Amplitude: 1.625 mV
Lead Channel Sensing Intrinsic Amplitude: 9.25 mV
Lead Channel Setting Pacing Amplitude: 2 V
Lead Channel Setting Pacing Pulse Width: 0.4 ms
Lead Channel Setting Sensing Sensitivity: 0.9 mV
MDC IDC MSMT BATTERY REMAINING LONGEVITY: 59 mo
MDC IDC MSMT BATTERY VOLTAGE: 3.01 V
MDC IDC MSMT LEADCHNL LV PACING THRESHOLD PULSEWIDTH: 0.4 ms
MDC IDC MSMT LEADCHNL RA IMPEDANCE VALUE: 418 Ohm
MDC IDC MSMT LEADCHNL RA SENSING INTR AMPL: 2.125 mV
MDC IDC MSMT LEADCHNL RV PACING THRESHOLD AMPLITUDE: 0.375 V
MDC IDC MSMT LEADCHNL RV PACING THRESHOLD PULSEWIDTH: 0.4 ms
MDC IDC MSMT LEADCHNL RV SENSING INTR AMPL: 9.625 mV
MDC IDC SESS DTM: 20151021165725
MDC IDC SET LEADCHNL LV PACING AMPLITUDE: 1.5 V
MDC IDC SET LEADCHNL LV PACING PULSEWIDTH: 0.8 ms
MDC IDC SET LEADCHNL RV PACING AMPLITUDE: 2.5 V
MDC IDC SET ZONE DETECTION INTERVAL: 350 ms
MDC IDC STAT BRADY AS VP PERCENT: 39.49 %
MDC IDC STAT BRADY RA PERCENT PACED: 59.98 %
Zone Setting Detection Interval: 400 ms

## 2014-05-07 NOTE — Assessment & Plan Note (Signed)
His Medtronic biventricular pacemaker is working normally. We'll plan to recheck in several months. 

## 2014-05-07 NOTE — Progress Notes (Signed)
HPI [name] returns today for followup. He is a very pleasant 80 year old man with a nonischemic cardiomyopathy, chronic systolic heart failure, history of alcohol abuse, severe problems with gait and balance, and hypertension. When I saw the patient last several months ago, he had persistent and chronic weakness and fatigue which appears to be a bit improved as has his problem with shingles. In He has rare palpitations. No syncope. He uses a walker. His peripheral edema is also better though he does not deny sodium excess. Allergies  Allergen Reactions  . Penicillins Rash  . Sulfa Antibiotics Rash  . Losartan     Hyperkalemia      Current Outpatient Prescriptions  Medication Sig Dispense Refill  . Calcium Carbonate-Vitamin D (CALCIUM-VITAMIN D) 500-200 MG-UNIT per tablet Take 1 tablet by mouth daily.       . furosemide (LASIX) 20 MG tablet TAKE 1 TABLET (20 MG TOTAL) BY MOUTH 2 (TWO) TIMES DAILY.  180 tablet  3  . hydrALAZINE (APRESOLINE) 25 MG tablet TAKE 1 TABLET BY MOUTH 3 TIMES A DAY  270 tablet  2  . Misc Natural Products (OSTEO BI-FLEX ADV DOUBLE ST PO) Take by mouth daily.      . Multiple Vitamin (MULTIVITAMIN) tablet Take 1 tablet by mouth daily.      . mupirocin ointment (BACTROBAN) 2 %       . Tamsulosin HCl (FLOMAX) 0.4 MG CAPS Take 0.4 mg by mouth 2 (two) times daily.       Carlena Hurl 15 MG TABS tablet Take 15 mg by mouth 2 (two) times daily with a meal.        No current facility-administered medications for this visit.     Past Medical History  Diagnosis Date  . Cataract     "right needs taken out" (01/25/12)  . Hypertension   . Chronic systolic CHF (congestive heart failure)   . Symptomatic bradycardia 01/25/12    s/p Medtronic BiV PPM  . Anemia     "my RBC's are low"  . History of blood transfusion 2001    "w/hip operation"  . Arthritis     "all over"  . Chronic lower back pain     "arthritis"  . Heart block     s/p Medtronic BiV PPM    ROS:   All  systems reviewed and negative except as noted in the HPI.   Past Surgical History  Procedure Laterality Date  . Anterior fusion cervical spine  2001    for numbness in right arm, left with anhydrosis  . Total hip arthroplasty  2001    right; fell  . Carotid endarterectomy  ~ 2008    left  . Carpal tunnel release      bilaterally  . Insert / replace / remove pacemaker  01/25/12    Medtronic BiV PPM  . Tonsillectomy and adenoidectomy  ~ 1933  . Joint replacement    . Cataract extraction w/ intraocular lens implant  ~2011    left  . Ganglion cyst excision      ? left     History reviewed. No pertinent family history.   History   Social History  . Marital Status: Married    Spouse Name: N/A    Number of Children: N/A  . Years of Education: N/A   Occupational History  . Not on file.   Social History Main Topics  . Smoking status: Former Smoker -- 1.00 packs/day for 35 years  Types: Cigarettes    Quit date: 08/21/1977  . Smokeless tobacco: Never Used  . Alcohol Use: 12.6 oz/week    14 Glasses of wine, 7 Shots of liquor per week  . Drug Use: Not on file  . Sexual Activity: Not Currently   Other Topics Concern  . Not on file   Social History Narrative  . No narrative on file     BP 121/65  Pulse 71  Ht 5\' 2"  (1.575 m)  Wt 148 lb 9.6 oz (67.405 kg)  BMI 27.17 kg/m2  Physical Exam:  Chronically ill appearing elderly man, NAD HEENT: Unremarkable Neck:  6 cm JVD, no thyromegally Back:  No CVA tenderness Lungs:  Clear with no wheezes, rales, or rhonchi. HEART:  Regular rate rhythm, no murmurs, no rubs, no clicks Abd:  soft, positive bowel sounds, no organomegally, no rebound, no guarding, scabbing, weaping erythmatous lesion Ext:  2 plus pulses, no edema, no cyanosis, no clubbing Skin:  No rashes no nodules Neuro:  CN II through XII intact, motor grossly intact   DEVICE  Normal device function.  See PaceArt for details. His activity level has improved  though he is still not back to the level of activity that he had earlier in the year.  Assess/Plan:

## 2014-05-07 NOTE — Patient Instructions (Signed)
Your physician recommends that you continue on your current medications as directed. Please refer to the Current Medication list given to you today.  Remote monitoring is used to monitor your Pacemaker of ICD from home. This monitoring reduces the number of office visits required to check your device to one time per year. It allows us to keep an eye on the functioning of your device to ensure it is working properly. You are scheduled for a device check from home on 08/11/14. You may send your transmission at any time that day. If you have a wireless device, the transmission will be sent automatically. After your physician reviews your transmission, you will receive a postcard with your next transmission date.  Your physician wants you to follow-up in: 1 year with Dr. Taylor.  You will receive a reminder letter in the mail two months in advance. If you don't receive a letter, please call our office to schedule the follow-up appointment.  

## 2014-05-07 NOTE — Assessment & Plan Note (Signed)
Interrogation of the patient's pacemaker today demonstrates brief episodes of atrial fibrillation. He is systemically anticoagulated. He will continue Xarelto. I would anticipate lifelong anticoagulation.

## 2014-05-07 NOTE — Assessment & Plan Note (Addendum)
His symptoms are class III, but he is limited more by debilitation and generalized weakness then heart failure per se. No change in medical therapy at this time. He is encouraged to reduce his sodium intake. He is also encouraged to keep his feet elevated and wear support stockings for chronic peripheral edema.

## 2014-06-26 ENCOUNTER — Encounter (HOSPITAL_COMMUNITY): Payer: Self-pay | Admitting: Internal Medicine

## 2014-07-08 ENCOUNTER — Encounter (HOSPITAL_COMMUNITY): Payer: Self-pay | Admitting: Emergency Medicine

## 2014-07-08 ENCOUNTER — Emergency Department (HOSPITAL_COMMUNITY): Payer: Medicare Other

## 2014-07-08 ENCOUNTER — Inpatient Hospital Stay (HOSPITAL_COMMUNITY)
Admission: EM | Admit: 2014-07-08 | Discharge: 2014-07-12 | DRG: 314 | Disposition: A | Discharge status: Home Health | Payer: Medicare Other | Attending: Internal Medicine | Admitting: Internal Medicine

## 2014-07-08 DIAGNOSIS — R2689 Other abnormalities of gait and mobility: Secondary | ICD-10-CM | POA: Diagnosis present

## 2014-07-08 DIAGNOSIS — F1010 Alcohol abuse, uncomplicated: Secondary | ICD-10-CM

## 2014-07-08 DIAGNOSIS — I4891 Unspecified atrial fibrillation: Secondary | ICD-10-CM | POA: Diagnosis present

## 2014-07-08 DIAGNOSIS — N400 Enlarged prostate without lower urinary tract symptoms: Secondary | ICD-10-CM | POA: Diagnosis present

## 2014-07-08 DIAGNOSIS — Z6826 Body mass index (BMI) 26.0-26.9, adult: Secondary | ICD-10-CM

## 2014-07-08 DIAGNOSIS — I480 Paroxysmal atrial fibrillation: Secondary | ICD-10-CM

## 2014-07-08 DIAGNOSIS — W19XXXA Unspecified fall, initial encounter: Secondary | ICD-10-CM | POA: Insufficient documentation

## 2014-07-08 DIAGNOSIS — Z7901 Long term (current) use of anticoagulants: Secondary | ICD-10-CM

## 2014-07-08 DIAGNOSIS — M25519 Pain in unspecified shoulder: Secondary | ICD-10-CM

## 2014-07-08 DIAGNOSIS — N183 Chronic kidney disease, stage 3 unspecified: Secondary | ICD-10-CM

## 2014-07-08 DIAGNOSIS — M25511 Pain in right shoulder: Secondary | ICD-10-CM | POA: Diagnosis not present

## 2014-07-08 DIAGNOSIS — I5032 Chronic diastolic (congestive) heart failure: Secondary | ICD-10-CM | POA: Insufficient documentation

## 2014-07-08 DIAGNOSIS — Z96641 Presence of right artificial hip joint: Secondary | ICD-10-CM | POA: Diagnosis present

## 2014-07-08 DIAGNOSIS — Z981 Arthrodesis status: Secondary | ICD-10-CM

## 2014-07-08 DIAGNOSIS — Z87891 Personal history of nicotine dependence: Secondary | ICD-10-CM

## 2014-07-08 DIAGNOSIS — I5022 Chronic systolic (congestive) heart failure: Secondary | ICD-10-CM | POA: Diagnosis present

## 2014-07-08 DIAGNOSIS — F1020 Alcohol dependence, uncomplicated: Secondary | ICD-10-CM | POA: Diagnosis present

## 2014-07-08 DIAGNOSIS — D62 Acute posthemorrhagic anemia: Secondary | ICD-10-CM | POA: Diagnosis present

## 2014-07-08 DIAGNOSIS — D649 Anemia, unspecified: Secondary | ICD-10-CM

## 2014-07-08 DIAGNOSIS — E43 Unspecified severe protein-calorie malnutrition: Secondary | ICD-10-CM | POA: Insufficient documentation

## 2014-07-08 DIAGNOSIS — K219 Gastro-esophageal reflux disease without esophagitis: Secondary | ICD-10-CM | POA: Diagnosis present

## 2014-07-08 DIAGNOSIS — M19011 Primary osteoarthritis, right shoulder: Secondary | ICD-10-CM | POA: Diagnosis present

## 2014-07-08 DIAGNOSIS — H269 Unspecified cataract: Secondary | ICD-10-CM | POA: Diagnosis present

## 2014-07-08 DIAGNOSIS — Z79899 Other long term (current) drug therapy: Secondary | ICD-10-CM

## 2014-07-08 DIAGNOSIS — I255 Ischemic cardiomyopathy: Secondary | ICD-10-CM | POA: Diagnosis present

## 2014-07-08 DIAGNOSIS — T148XXA Other injury of unspecified body region, initial encounter: Secondary | ICD-10-CM

## 2014-07-08 DIAGNOSIS — R58 Hemorrhage, not elsewhere classified: Secondary | ICD-10-CM | POA: Diagnosis not present

## 2014-07-08 DIAGNOSIS — I714 Abdominal aortic aneurysm, without rupture: Secondary | ICD-10-CM | POA: Diagnosis present

## 2014-07-08 DIAGNOSIS — Z86718 Personal history of other venous thrombosis and embolism: Secondary | ICD-10-CM

## 2014-07-08 DIAGNOSIS — Y92009 Unspecified place in unspecified non-institutional (private) residence as the place of occurrence of the external cause: Secondary | ICD-10-CM

## 2014-07-08 DIAGNOSIS — R296 Repeated falls: Secondary | ICD-10-CM | POA: Diagnosis present

## 2014-07-08 DIAGNOSIS — S43014A Anterior dislocation of right humerus, initial encounter: Secondary | ICD-10-CM | POA: Diagnosis present

## 2014-07-08 DIAGNOSIS — Z950 Presence of cardiac pacemaker: Secondary | ICD-10-CM | POA: Diagnosis present

## 2014-07-08 DIAGNOSIS — I129 Hypertensive chronic kidney disease with stage 1 through stage 4 chronic kidney disease, or unspecified chronic kidney disease: Secondary | ICD-10-CM | POA: Diagnosis present

## 2014-07-08 DIAGNOSIS — I5042 Chronic combined systolic (congestive) and diastolic (congestive) heart failure: Secondary | ICD-10-CM | POA: Diagnosis present

## 2014-07-08 DIAGNOSIS — W010XXA Fall on same level from slipping, tripping and stumbling without subsequent striking against object, initial encounter: Secondary | ICD-10-CM | POA: Diagnosis present

## 2014-07-08 HISTORY — DX: Localized edema: R60.0

## 2014-07-08 LAB — BASIC METABOLIC PANEL
Anion gap: 9 (ref 5–15)
BUN: 33 mg/dL — ABNORMAL HIGH (ref 6–23)
CO2: 26 mmol/L (ref 19–32)
Calcium: 9 mg/dL (ref 8.4–10.5)
Chloride: 107 mEq/L (ref 96–112)
Creatinine, Ser: 1.6 mg/dL — ABNORMAL HIGH (ref 0.50–1.35)
GFR calc Af Amer: 44 mL/min — ABNORMAL LOW (ref >90)
GFR calc non Af Amer: 38 mL/min — ABNORMAL LOW (ref >90)
GLUCOSE: 99 mg/dL (ref 70–99)
Potassium: 4.5 mmol/L (ref 3.5–5.1)
SODIUM: 142 mmol/L (ref 135–145)

## 2014-07-08 LAB — CBC WITH DIFFERENTIAL/PLATELET
BASOS PCT: 0 % (ref 0–1)
Basophils Absolute: 0 10*3/uL (ref 0.0–0.1)
Eosinophils Absolute: 0.2 10*3/uL (ref 0.0–0.7)
Eosinophils Relative: 2 % (ref 0–5)
HEMATOCRIT: 30.6 % — AB (ref 39.0–52.0)
HEMOGLOBIN: 10.1 g/dL — AB (ref 13.0–17.0)
LYMPHS PCT: 11 % — AB (ref 12–46)
Lymphs Abs: 1 10*3/uL (ref 0.7–4.0)
MCH: 30.6 pg (ref 26.0–34.0)
MCHC: 33 g/dL (ref 30.0–36.0)
MCV: 92.7 fL (ref 78.0–100.0)
MONO ABS: 1.4 10*3/uL — AB (ref 0.1–1.0)
Monocytes Relative: 15 % — ABNORMAL HIGH (ref 3–12)
NEUTROS ABS: 6.8 10*3/uL (ref 1.7–7.7)
NEUTROS PCT: 72 % (ref 43–77)
Platelets: 135 10*3/uL — ABNORMAL LOW (ref 150–400)
RBC: 3.3 MIL/uL — ABNORMAL LOW (ref 4.22–5.81)
RDW: 14.3 % (ref 11.5–15.5)
WBC: 9.5 10*3/uL (ref 4.0–10.5)

## 2014-07-08 LAB — PROTIME-INR
INR: 2.57 — AB (ref 0.00–1.49)
PROTHROMBIN TIME: 27.8 s — AB (ref 11.6–15.2)

## 2014-07-08 MED ORDER — TAMSULOSIN HCL 0.4 MG PO CAPS
0.4000 mg | ORAL_CAPSULE | Freq: Two times a day (BID) | ORAL | Status: DC
  Administered 2014-07-09 – 2014-07-12 (×8): 0.4 mg via ORAL
  Filled 2014-07-08 (×9): qty 1

## 2014-07-08 MED ORDER — SODIUM CHLORIDE 0.9 % IV SOLN: 250.0000 mL | INTRAVENOUS | Status: DC | PRN

## 2014-07-08 MED ORDER — POLYETHYLENE GLYCOL 3350 17 G PO PACK
17.0000 g | PACK | Freq: Every day | ORAL | Status: DC | PRN
  Filled 2014-07-08 (×2): qty 1

## 2014-07-08 MED ORDER — ADULT MULTIVITAMIN W/MINERALS CH
1.0000 | ORAL_TABLET | Freq: Every day | ORAL | Status: DC
  Administered 2014-07-09 – 2014-07-12 (×4): 1 via ORAL
  Filled 2014-07-08 (×4): qty 1

## 2014-07-08 MED ORDER — SODIUM CHLORIDE 0.9 % IJ SOLN: 3.0000 mL | INTRAMUSCULAR | Status: DC | PRN

## 2014-07-08 MED ORDER — ACETAMINOPHEN 650 MG RE SUPP: 650.0000 mg | Freq: Four times a day (QID) | RECTAL | Status: DC | PRN

## 2014-07-08 MED ORDER — ONDANSETRON HCL 4 MG PO TABS: 4.0000 mg | ORAL_TABLET | Freq: Four times a day (QID) | ORAL | Status: DC | PRN

## 2014-07-08 MED ORDER — LORAZEPAM 1 MG PO TABS: 1.0000 mg | ORAL_TABLET | Freq: Four times a day (QID) | ORAL | Status: AC | PRN

## 2014-07-08 MED ORDER — SODIUM CHLORIDE 0.9 % IJ SOLN
3.0000 mL | Freq: Two times a day (BID) | INTRAMUSCULAR | Status: DC
  Administered 2014-07-09 – 2014-07-12 (×8): 3 mL via INTRAVENOUS

## 2014-07-08 MED ORDER — MORPHINE SULFATE 4 MG/ML IJ SOLN: 4.0000 mg | Freq: Once | INTRAMUSCULAR | Status: DC

## 2014-07-08 MED ORDER — OXYCODONE HCL 5 MG PO TABS
5.0000 mg | ORAL_TABLET | ORAL | Status: DC | PRN
  Administered 2014-07-10 – 2014-07-12 (×2): 5 mg via ORAL
  Filled 2014-07-08 (×2): qty 1

## 2014-07-08 MED ORDER — LORAZEPAM 2 MG/ML IJ SOLN: 1.0000 mg | Freq: Four times a day (QID) | INTRAMUSCULAR | Status: AC | PRN

## 2014-07-08 MED ORDER — FOLIC ACID 1 MG PO TABS
1.0000 mg | ORAL_TABLET | Freq: Every day | ORAL | Status: DC
  Administered 2014-07-09 – 2014-07-12 (×4): 1 mg via ORAL
  Filled 2014-07-08 (×4): qty 1

## 2014-07-08 MED ORDER — SENNA 8.6 MG PO TABS
1.0000 | ORAL_TABLET | Freq: Two times a day (BID) | ORAL | Status: DC
  Administered 2014-07-09 – 2014-07-12 (×7): 8.6 mg via ORAL
  Filled 2014-07-08 (×10): qty 1

## 2014-07-08 MED ORDER — THIAMINE HCL 100 MG/ML IJ SOLN
100.0000 mg | Freq: Every day | INTRAMUSCULAR | Status: DC
  Filled 2014-07-08: qty 1

## 2014-07-08 MED ORDER — ONDANSETRON HCL 4 MG/2ML IJ SOLN: 4.0000 mg | Freq: Four times a day (QID) | INTRAMUSCULAR | Status: DC | PRN

## 2014-07-08 MED ORDER — VITAMIN B-1 100 MG PO TABS
100.0000 mg | ORAL_TABLET | Freq: Every day | ORAL | Status: DC
Start: 2014-07-09 — End: 2014-07-12
  Administered 2014-07-09 – 2014-07-12 (×4): 100 mg via ORAL
  Filled 2014-07-08 (×4): qty 1

## 2014-07-08 MED ORDER — ACETAMINOPHEN 325 MG PO TABS: 650.0000 mg | ORAL_TABLET | Freq: Four times a day (QID) | ORAL | Status: DC | PRN

## 2014-07-08 MED ORDER — HYDRALAZINE HCL 25 MG PO TABS
25.0000 mg | ORAL_TABLET | Freq: Two times a day (BID) | ORAL | Status: DC
  Administered 2014-07-09 – 2014-07-12 (×7): 25 mg via ORAL
  Filled 2014-07-08 (×10): qty 1

## 2014-07-08 MED ORDER — FUROSEMIDE 20 MG PO TABS
20.0000 mg | ORAL_TABLET | Freq: Every day | ORAL | Status: DC
  Administered 2014-07-09 – 2014-07-12 (×4): 20 mg via ORAL
  Filled 2014-07-08 (×4): qty 1

## 2014-07-08 NOTE — ED Notes (Signed)
Per EMS: Pt from home fell yesterday after slipping and fell on his back with no complaints of head/neck/back pain.  Pt wife dressed wound last night and visited doctor this morning.  When pulling off dressing at office, skin tear started bleeding and was sent to [facility].  Pt on zarelto. Pt has skin tear on left elbow and right hand.  Pt dressing changed on L elbow on arrival.

## 2014-07-08 NOTE — ED Notes (Signed)
Pt from home fell yesterday after slipping and fell on his back with no complaints of head/neck/back pain.  Pt wife dressed wound last night and visited doctor this morning.  When pulling off dressing at office, skin tear started bleeding and was sent to [facility].  Pt on zarelto. Pt has skin tear on left elbow and right hand.  Pt dressing changed on L elbow on arrival.

## 2014-07-08 NOTE — Progress Notes (Signed)
Report received from Maegan,RN-ED.

## 2014-07-08 NOTE — ED Notes (Signed)
ED Resident at bedside removed one ABD pad due to bright red blood through pad. Applied more 1 surgicel packet.

## 2014-07-08 NOTE — Progress Notes (Signed)
Paged admitting doctor that pt is now on the unit.

## 2014-07-08 NOTE — ED Notes (Signed)
Spoke with ED resident patient able to eat and drink. Gave patient a [place] sandwich, apple sauce, and apple juice.

## 2014-07-08 NOTE — ED Provider Notes (Signed)
CSN: [id number]     Arrival date & time 07/08/14  1454 History   First MD Initiated Contact with Patient 07/08/14 1505     Chief Complaint  Patient presents with  . Abrasion     (Consider location/radiation/quality/duration/timing/severity/associated sxs/prior Treatment) Patient is a 80 y.o. male presenting with fall. The history is provided by the patient and the spouse.  Fall This is a new problem. The current episode started yesterday (8pm last night). The problem occurs constantly. The problem has been unchanged. Pertinent negatives include no abdominal pain, chest pain, diaphoresis, fever, headaches, joint swelling, myalgias, nausea, rash, sore throat, visual change, vomiting or weakness. Nothing aggravates the symptoms. He has tried nothing for the symptoms.    Past Medical History  Diagnosis Date  . Cataract     "right needs taken out" (01/25/12)  . Hypertension   . Chronic systolic CHF (congestive heart failure)   . Symptomatic bradycardia 01/25/12    s/p Medtronic BiV PPM  . Anemia     "my RBC's are low"  . History of blood transfusion 2001    "w/hip operation"  . Arthritis     "all over"  . Chronic lower back pain     "arthritis"  . Heart block     s/p Medtronic BiV PPM  . Leg edema   . DVT (deep vein thrombosis) in pregnancy    Past Surgical History  Procedure Laterality Date  . Anterior fusion cervical spine  2001    for numbness in right arm, left with anhydrosis  . Total hip arthroplasty  2001    right; fell  . Carotid endarterectomy  ~ 2008    left  . Carpal tunnel release      bilaterally  . Insert / replace / remove pacemaker  01/25/12    Medtronic BiV PPM  . Tonsillectomy and adenoidectomy  ~ 1933  . Joint replacement    . Cataract extraction w/ intraocular lens implant  ~2011    left  . Ganglion cyst excision      ? left  . Bi-ventricular pacemaker insertion N/A 01/25/2012    Procedure: BI-VENTRICULAR PACEMAKER INSERTION (CRT-P);  Surgeon: Marinus Maw, MD;  Location: [facility] CATH LAB;  Service: Cardiovascular;  Laterality: N/A;   Family History  Problem Relation Age of Onset  . Congestive Heart Failure Mother    History  Substance Use Topics  . Smoking status: Former Smoker -- 1.00 packs/day for 35 years    Types: Cigarettes    Quit date: 08/21/1977  . Smokeless tobacco: Never Used  . Alcohol Use: 12.6 oz/week    14 Glasses of wine, 7 Shots of liquor per week     Comment: As much as possible    Review of Systems  Constitutional: Negative for fever, diaphoresis, activity change and appetite change.  HENT: Negative for facial swelling, sore throat, tinnitus, trouble swallowing and voice change.   Eyes: Negative for pain, redness and visual disturbance.  Respiratory: Negative for chest tightness, shortness of breath and wheezing.   Cardiovascular: Negative for chest pain, palpitations and leg swelling.  Gastrointestinal: Negative for nausea, vomiting, abdominal pain, diarrhea, constipation and abdominal distention.  Endocrine: Negative.   Genitourinary: Negative.  Negative for dysuria, decreased urine volume, scrotal swelling and testicular pain.  Musculoskeletal: Positive for gait problem (leg weakness). Negative for myalgias, back pain and joint swelling.  Skin: Positive for wound. Negative for rash.  Neurological: Negative.  Negative for dizziness, tremors, weakness and headaches.  Hematological: Bruises/bleeds easily.  Psychiatric/Behavioral: Negative for suicidal ideas, hallucinations and self-injury. The patient is not nervous/anxious.       Allergies  Penicillins; Sulfa antibiotics; and Losartan  Home Medications   Prior to Admission medications   Medication Sig Start Date End Date Taking? Authorizing Provider  acetaminophen (TYLENOL) 500 MG tablet Take 500 mg by mouth every 6 (six) hours as needed.   Yes Historical Provider, MD  Calcium Carbonate-Vitamin D (CALCIUM-VITAMIN D) 500-200 MG-UNIT per tablet Take 1  tablet by mouth daily.    Yes Historical Provider, MD  furosemide (LASIX) 20 MG tablet Take 20 mg by mouth daily.   Yes Historical Provider, MD  hydrALAZINE (APRESOLINE) 25 MG tablet Take 25 mg by mouth 2 (two) times daily.   Yes Historical Provider, MD  Multiple Vitamin (MULTIVITAMIN) tablet Take 1 tablet by mouth daily.   Yes Historical Provider, MD  mupirocin ointment (BACTROBAN) 2 %  09/04/13  Yes Historical Provider, MD  Rivaroxaban (XARELTO) 15 MG TABS tablet Take 15 mg by mouth daily.   Yes Historical Provider, MD  Tamsulosin HCl (FLOMAX) 0.4 MG CAPS Take 0.4 mg by mouth 2 (two) times daily.    Yes Historical Provider, MD   BP 111/60 mmHg  Pulse 73  Temp(Src) 99.5 F (37.5 C) (Oral)  Resp 15  Ht 5\' 5"  (1.651 m)  Wt 153 lb 10.6 oz (69.7 kg)  BMI 25.57 kg/m2  SpO2 98% Physical Exam  Constitutional: He is oriented to person, place, and time. He appears well-developed and well-nourished. No distress.  HENT:  Head: Normocephalic and atraumatic.  Right Ear: External ear normal.  Left Ear: External ear normal.  Nose: Nose normal.  Mouth/Throat: Oropharynx is clear and moist.  Eyes: Conjunctivae and EOM are normal. Pupils are equal, round, and reactive to light. No scleral icterus.  Neck: Normal range of motion. Neck supple. No JVD present. No tracheal deviation present. No thyromegaly present.  Cardiovascular: Normal rate and intact distal pulses.  Exam reveals no gallop and no friction rub.   No murmur heard. Pulmonary/Chest: Effort normal and breath sounds normal. No stridor. No respiratory distress. He has no wheezes. He has no rales.  Abdominal: Soft. He exhibits no distension. There is no tenderness. There is no rebound and no guarding.  Musculoskeletal: Normal range of motion. He exhibits no edema or tenderness.  Neurological: He is alert and oriented to person, place, and time. No cranial nerve deficit. He exhibits normal muscle tone. Coordination normal.  Skin: Skin is warm  and dry. No rash noted. He is not diaphoretic.  Large skins tears hemostatic with pressures (8 cm to upper back and 12 cm to lower back). Skin tear to left elbow. Skin tear to right 5th finger 1cm and hemostatic.   Psychiatric: He has a normal mood and affect. His behavior is normal.  Nursing note and vitals reviewed.   ED Course  Wound repair Date/Time: 07/08/2014 7:04 PM Performed by: Lula Olszewski Authorized by: Lula Olszewski Consent: Verbal consent obtained. Consent given by: patient Patient identity confirmed: verbally with patient, arm band and hospital-assigned identification number Time out: Immediately prior to procedure a "time out" was called to verify the correct patient, procedure, equipment, support staff and site/side marked as required. Preparation: Patient was prepped and draped in the usual sterile fashion. Local anesthesia used: no Patient sedated: no Patient tolerance: Patient tolerated the procedure well with no immediate complications Comments: Multiple back skin tears repaired with surgicel   (including critical care  time) Labs Review Labs Reviewed  CBC WITH DIFFERENTIAL - Abnormal; Notable for the following:    RBC 3.30 (*)    Hemoglobin 10.1 (*)    HCT 30.6 (*)    Platelets 135 (*)    Lymphocytes Relative 11 (*)    Monocytes Relative 15 (*)    Monocytes Absolute 1.4 (*)    All other components within normal limits  BASIC METABOLIC PANEL - Abnormal; Notable for the following:    BUN 33 (*)    Creatinine, Ser 1.60 (*)    GFR calc non Af Amer 38 (*)    GFR calc Af Amer 44 (*)    All other components within normal limits  PROTIME-INR - Abnormal; Notable for the following:    Prothrombin Time 27.8 (*)    INR 2.57 (*)    All other components within normal limits  CBC    Imaging Review Ct Head Wo Contrast  07/08/2014   CLINICAL DATA:  80 year old male with history of trauma from a fall backwards. Patient on Xarelto. Currently unable to straighten is  intact.  EXAM: CT HEAD WITHOUT CONTRAST  TECHNIQUE: Contiguous axial images were obtained from the base of the skull through the vertex without intravenous contrast.  COMPARISON:  Head CT 12/17/2012.  FINDINGS: Mild cerebral and cerebellar atrophy. Patchy and confluent areas of decreased attenuation are noted throughout the deep and periventricular white matter of the cerebral hemispheres bilaterally, compatible with chronic microvascular ischemic disease. Well-defined foci of low attenuation in the right basal ganglia, compatible with old lacunar infarcts (unchanged). No acute displaced skull fractures are identified. No acute intracranial abnormality. Specifically, no evidence of acute post-traumatic intracranial hemorrhage, no definite regions of acute/subacute cerebral ischemia, no focal mass, mass effect, hydrocephalus or abnormal intra or extra-axial fluid collections. Right mastoids are well pneumatized. Status post left mastoidectomy. Paranasal sinuses are generally well pneumatized, with exception of some multifocal mucosal thickening in the ethmoids bilaterally, probable inspissated secretions in the right frontoethmoidal recess, and polypoid lesions in the right maxillary sinus which may represent polyps or mucosal retention cysts.  IMPRESSION: 1. No signs of significant acute traumatic injury to the skull or brain. 2. Mild cerebral and cerebellar atrophy with chronic microvascular ischemic changes in the cerebral white matter and old right basal ganglia lacunar infarcts, similar to the prior study. 3. Paranasal sinus disease, as above.   Electronically Signed   By: Trudie Reedaniel  Entrikin M.D.   On: 07/08/2014 19:40     EKG Interpretation None      MDM   Final diagnoses:  Fall    The patient is a 80 y.o. Male on xarelto who suffered a mechanical fall last night at home when he slipped trying to grab his walker and fell onto his back on a carpeted floor suffering diffuse skin tears as above.  Patient AFVSS. Multiple attempts are made to stop the bleeding including surgicel and steri strips but patient continues to slowly ooze blood. Patient remains HDS and Hgb 10.1. Pressure dressings applied and patient placed in observation for continued bleeding.  Patient seen with attending, Dr. Criss AlvineGoldston, who oversaw clinical decision making.     Lula OlszewskiMike Manette Doto, MD 07/09/14 [id number]  Audree CamelScott T Goldston, MD 07/17/14 [phone]

## 2014-07-08 NOTE — H&P (Addendum)
[facility] History and Physical  Phillip Kelley Phillip Kelley ZOX:[id number] DOB: 1927-06-17 DOA: 07/08/2014  Referring physician: Dr. Festus AloeGoebel - [facility] PCP: Phillip Kelley,Phillip Kelley, Phillip Kelley   Chief Complaint: Fall adn bleeding  HPI: Phillip Kelley Phillip Kelley is a 80 y.o. male   Larey SeatFell 24 hours ago. Lost balance while trying to get walker. Felll backwards. Fell directly onto back and then head. Fell onto a rug. Denies LOC, HA, sezure, palpitations, CP, SOB. SKin tears noted on back . Home bandages applied but blood soaks through. Bleeding is constant. Denies fevers.  Came to ED as unable to stop the bleeding.    Review of Systems:  Constitutional:  No weight loss, night sweats, Fevers, chills, fatigue.  HEENT:  No headaches, Difficulty swallowing,Tooth/dental problems,Sore throat,  No sneezing, itching, ear ache, nasal congestion, post nasal drip,  Cardio-vascular:  No chest pain, Orthopnea, PND, swelling in lower extremities, anasarca, dizziness, palpitations  GI:  No heartburn, indigestion, abdominal pain, nausea, vomiting, diarrhea, change in bowel habits, loss of appetite  Resp:  No shortness of breath with exertion or at rest. No excess mucus, no productive cough, No non-productive cough, No coughing up of blood.No change in color of mucus.No wheezing.No chest wall deformity  Skin:  no rash or lesions.  GU:  no dysuria, change in color of urine, no urgency or frequency. No flank pain.  Musculoskeletal:  Per hpi Psych:  No change in mood or affect. No depression or anxiety. No memory loss.   Past Medical History  Diagnosis Date  . Cataract     "right needs taken out" (01/25/12)  . Hypertension   . Chronic systolic CHF (congestive heart failure)   . Symptomatic bradycardia 01/25/12    s/p Medtronic BiV PPM  . Anemia     "my RBC's are low"  . History of blood transfusion 2001    "Phillip/hip operation"  . Arthritis     "all over"  . Chronic lower back pain     "arthritis"  . Heart block     s/p  Medtronic BiV PPM  . Leg edema   . DVT (deep vein thrombosis) in pregnancy    Past Surgical History  Procedure Laterality Date  . Anterior fusion cervical spine  2001    for numbness in right arm, left with anhydrosis  . Total hip arthroplasty  2001    right; fell  . Carotid endarterectomy  ~ 2008    left  . Carpal tunnel release      bilaterally  . Insert / replace / remove pacemaker  01/25/12    Medtronic BiV PPM  . Tonsillectomy and adenoidectomy  ~ 1933  . Joint replacement    . Cataract extraction Phillip/ intraocular lens implant  ~2011    left  . Ganglion cyst excision      ? left  . Bi-ventricular pacemaker insertion N/A 01/25/2012    Procedure: BI-VENTRICULAR PACEMAKER INSERTION (CRT-P);  Surgeon: Marinus MawGregg Phillip Taylor, Phillip Kelley;  [facility];  Service: Cardiovascular;  Laterality: N/A;   Social History:  reports that he quit smoking about 36 years ago. His smoking use included Cigarettes. He has a 35 pack-year smoking history. He has never used smokeless tobacco. He reports that he drinks about 12.6 oz of alcohol per week. His drug history is not on file.  Allergies  Allergen Reactions  . Penicillins Rash  . Sulfa Antibiotics Rash  . Losartan     Hyperkalemia     Family History  Problem Relation Age  of Onset  . Congestive Heart Failure Mother      Prior to Admission medications   Medication Sig Start Date End Date Taking? Authorizing Provider  acetaminophen (TYLENOL) 500 MG tablet Take 500 mg by mouth every 6 (six) hours as needed.   Yes Historical Provider, Phillip Kelley  Calcium Carbonate-Vitamin D (CALCIUM-VITAMIN D) 500-200 MG-UNIT per tablet Take 1 tablet by mouth daily.    Yes Historical Provider, Phillip Kelley  furosemide (LASIX) 20 MG tablet Take 20 mg by mouth daily.   Yes Historical Provider, Phillip Kelley  hydrALAZINE (APRESOLINE) 25 MG tablet Take 25 mg by mouth 2 (two) times daily.   Yes Historical Provider, Phillip Kelley  Multiple Vitamin (MULTIVITAMIN) tablet Take 1 tablet by mouth daily.   Yes  Historical Provider, Phillip Kelley  mupirocin ointment (BACTROBAN) 2 %  09/04/13  Yes Historical Provider, Phillip Kelley  Rivaroxaban (XARELTO) 15 MG TABS tablet Take 15 mg by mouth daily.   Yes Historical Provider, Phillip Kelley  Tamsulosin HCl (FLOMAX) 0.4 MG CAPS Take 0.4 mg by mouth 2 (two) times daily.    Yes Historical Provider, Phillip Kelley   Physical Exam: Filed Vitals:   07/08/14 2015 07/08/14 2030 07/08/14 2100 07/08/14 2212  BP: 115/60 150/65 136/62 111/60  Pulse: 77 85 73   Temp:    99.5 F (37.5 Kelley)  TempSrc:    Oral  Resp: 22 26 16 15   Height:    5\' 5"  (1.651 m)  Weight:    69.7 kg (153 lb 10.6 oz)  SpO2: 97% 96% 95% 98%    Wt Readings from Last 3 Encounters:  07/08/14 69.7 kg (153 lb 10.6 oz)  05/07/14 67.405 kg (148 lb 9.6 oz)  10/31/13 68.04 kg (150 lb)    General:  Appears calm and comfortable Eyes:  PERRL, normal lids, irises & conjunctiva ENT:  grossly normal hearing, lips & tongue Neck:  no LAD, masses or thyromegaly Cardiovascular:  RRR, no m/r/g. No LE edema. Respiratory:  CTA bilaterally, no Phillip/r/r. Normal respiratory effort. Abdomen:  soft, ntnd Skin: Numerous ecchymosis and very thin skin. Bandages (ABDs) on R and L back skin tears Phillip/ bloody drainage. Skin Phillip/ scab formation in areas of skin shearing but Phillip/ bleeding around the scabs. No induration or purulence.  Musculoskeletal:  grossly normal tone BUE/BLE Psychiatric:  grossly normal mood and affect, speech fluent and appropriate Neurologic:  grossly non-focal.          Labs on Admission:  Basic Metabolic Panel:  Recent Labs Lab 07/08/14 1532  NA 142  K 4.5  CL 107  CO2 26  GLUCOSE 99  BUN 33*  CREATININE 1.60*  CALCIUM 9.0   Liver Function Tests: No results for input(s): AST, ALT, ALKPHOS, BILITOT, PROT, ALBUMIN in the last 168 hours. No results for input(s): LIPASE, AMYLASE in the last 168 hours. No results for input(s): AMMONIA in the last 168 hours. CBC:  Recent Labs Lab 07/08/14 1532  WBC 9.5  NEUTROABS 6.8  HGB  10.1*  HCT 30.6*  MCV 92.7  PLT 135*   Cardiac Enzymes: No results for input(s): CKTOTAL, CKMB, CKMBINDEX, TROPONINI in the last 168 hours.  BNP (last 3 results) No results for input(s): PROBNP in the last 8760 hours. CBG: No results for input(s): GLUCAP in the last 168 hours.  Radiological Exams on Admission: Ct Head Wo Contrast  07/08/2014   CLINICAL DATA:  80 year old male with history of trauma from a fall backwards. Patient on Xarelto. Currently unable to straighten is intact.  EXAM: CT HEAD  WITHOUT CONTRAST  TECHNIQUE: Contiguous axial images were obtained from the base of the skull through the vertex without intravenous contrast.  COMPARISON:  Head CT 12/17/2012.  FINDINGS: Mild cerebral and cerebellar atrophy. Patchy and confluent areas of decreased attenuation are noted throughout the deep and periventricular white matter of the cerebral hemispheres bilaterally, compatible with chronic microvascular ischemic disease. Well-defined foci of low attenuation in the right basal ganglia, compatible with old lacunar infarcts (unchanged). No acute displaced skull fractures are identified. No acute intracranial abnormality. Specifically, no evidence of acute post-traumatic intracranial hemorrhage, no definite regions of acute/subacute cerebral ischemia, no focal mass, mass effect, hydrocephalus or abnormal intra or extra-axial fluid collections. Right mastoids are well pneumatized. Status post left mastoidectomy. Paranasal sinuses are generally well pneumatized, with exception of some multifocal mucosal thickening in the ethmoids bilaterally, probable inspissated secretions in the right frontoethmoidal recess, and polypoid lesions in the right maxillary sinus which may represent polyps or mucosal retention cysts.  IMPRESSION: 1. No signs of significant acute traumatic injury to the skull or brain. 2. Mild cerebral and cerebellar atrophy with chronic microvascular ischemic changes in the cerebral white  matter and old right basal ganglia lacunar infarcts, similar to the prior study. 3. Paranasal sinus disease, as above.   Electronically Signed   By: Trudie Reed M.D.   On: 07/08/2014 19:40    EKG: ordered  Assessment/Plan Principal Problem:   Bleeding Active Problems:   Balance problems   Chronic systolic heart failure   Pacemaker-BiV Medtronic   Atrial fibrillation   Alcohol abuse   CKD (chronic kidney disease) stage 3, GFR 30-59 ml/min   Normocytic anemia  Uncontrollable bleeding: Pt Phillip/ numerous shearing skin lesions of the back and on chronic anticoagulation. Took Xarelto this morning despite bleeding all night. Hgb stable - Admit - Hold Xarelto until bleeding is stopped - pressure dressings - CBC in am  Falls: chronic issue for pt. Mechanical fall at home 24 hrs prior to admission. Pt weak and frail appearing. CT head Phillip/o acute bleed.  - PT/OT - Nutrition consult - Tylenol prn pain - Neuro check  dCHF: Echio 2014 showing EF 55-60% and Grade 1 diastolic dysfunction. Followed by Dr. [name]. No exacerbation - continue lasix - pressure will not support adding bblocker or ACEi at this time  HTN: pt on hydralazine at home but pressures low on admission - hold home Hydralazine - consider changing to ACEi or bblocker given CHF  Afib: noted on last pacemaker check per Dr. Bruna Potter note. paroxysmal - restart anticoagulation after bleeding is controlled - Nothing for rate at this time.   BPH:  - continue flomax  ETOH: pt endorses drinking "as much I can get my hands on." Does not see the need to cut back. Counseled to cut back - CIWA  CKD 3:  Cr 1.6 on admission. Baseline 1.5. GFR 38.  - BMET in am  ANemia: Hgb 10. Baseline 11. Normocytic. Likely from chronic disease. Actively bleeding - CBC in am  Code Status: FULL DVT Prophylaxis: SCD Family Communication: None Disposition Plan: Pending improvement     [facility] [email or web address] Password [email or web address]

## 2014-07-08 NOTE — Progress Notes (Signed)
Phillip Kelley [id number] Admission Data: 07/08/2014 10:29 PM Attending Provider: Ozella Rocks, MD IPJ:ASNKNL,ZJQBHAL C, MD Code Status: Full  Phillip Kelley is a 80 y.o. male patient admitted from ED:  -No acute distress noted.  -No complaints of shortness of breath.  -No complaints of chest pain.   Cardiac Monitoring: Box #  in place. Cardiac monitor yields:n/a.  Blood pressure 111/60, pulse 73, temperature 99.5 F (37.5 C), temperature source Oral, resp. rate 15, height 5\' 5"  (1.651 m), weight 69.7 kg (153 lb 10.6 oz), SpO2 98 %.   IV Fluids:  IV in place, occlusive dsg intact without redness, IV cath forearm right, condition patent and no redness none.   Allergies:  Penicillins; Sulfa antibiotics; and Losartan  Past Medical History:   has a past medical history of Cataract; Hypertension; Chronic systolic CHF (congestive heart failure); Symptomatic bradycardia (01/25/12); Anemia; History of blood transfusion (2001); Arthritis; Chronic lower back pain; and Heart block.  Past Surgical History:   has past surgical history that includes Anterior fusion cervical spine (2001); Total hip arthroplasty (2001); Carotid endarterectomy (~ 2008); Carpal tunnel release; Insert / replace / remove pacemaker (01/25/12); Tonsillectomy and adenoidectomy (~ 1933); Joint replacement; Cataract extraction w/ intraocular lens implant (~2011); Ganglion cyst excision; and bi-ventricular pacemaker insertion (N/A, 01/25/2012).  Social History:   reports that he quit smoking about 36 years ago. His smoking use included Cigarettes. He has a 35 pack-year smoking history. He has never used smokeless tobacco. He reports that he drinks about 12.6 oz of alcohol per week.  Skin: Charted on CHL  Patient/Family orientated to room. Information packet given to patient/family. Admission inpatient armband information verified with patient/family to include name and date of birth and placed on patient arm. Side rails up  x 2, fall assessment and education completed with patient/family. Patient/family able to verbalize understanding of risk associated with falls and verbalized understanding to call for assistance before getting out of bed. Call light within reach. Patient/family able to voice and demonstrate understanding of unit orientation instructions.

## 2014-07-09 ENCOUNTER — Observation Stay (HOSPITAL_COMMUNITY): Payer: Medicare Other

## 2014-07-09 ENCOUNTER — Inpatient Hospital Stay (HOSPITAL_COMMUNITY): Payer: Medicare Other

## 2014-07-09 ENCOUNTER — Encounter (HOSPITAL_COMMUNITY): Payer: Self-pay | Admitting: Radiology

## 2014-07-09 DIAGNOSIS — M19011 Primary osteoarthritis, right shoulder: Secondary | ICD-10-CM | POA: Diagnosis present

## 2014-07-09 DIAGNOSIS — M25511 Pain in right shoulder: Secondary | ICD-10-CM | POA: Diagnosis present

## 2014-07-09 DIAGNOSIS — D62 Acute posthemorrhagic anemia: Secondary | ICD-10-CM | POA: Diagnosis present

## 2014-07-09 DIAGNOSIS — Z981 Arthrodesis status: Secondary | ICD-10-CM | POA: Diagnosis not present

## 2014-07-09 DIAGNOSIS — W010XXA Fall on same level from slipping, tripping and stumbling without subsequent striking against object, initial encounter: Secondary | ICD-10-CM | POA: Diagnosis present

## 2014-07-09 DIAGNOSIS — E43 Unspecified severe protein-calorie malnutrition: Secondary | ICD-10-CM | POA: Insufficient documentation

## 2014-07-09 DIAGNOSIS — R58 Hemorrhage, not elsewhere classified: Secondary | ICD-10-CM | POA: Diagnosis present

## 2014-07-09 DIAGNOSIS — Z950 Presence of cardiac pacemaker: Secondary | ICD-10-CM | POA: Diagnosis not present

## 2014-07-09 DIAGNOSIS — W19XXXD Unspecified fall, subsequent encounter: Secondary | ICD-10-CM

## 2014-07-09 DIAGNOSIS — I4891 Unspecified atrial fibrillation: Secondary | ICD-10-CM | POA: Diagnosis present

## 2014-07-09 DIAGNOSIS — S43014A Anterior dislocation of right humerus, initial encounter: Secondary | ICD-10-CM | POA: Diagnosis present

## 2014-07-09 DIAGNOSIS — Z87891 Personal history of nicotine dependence: Secondary | ICD-10-CM | POA: Diagnosis not present

## 2014-07-09 DIAGNOSIS — H269 Unspecified cataract: Secondary | ICD-10-CM | POA: Diagnosis present

## 2014-07-09 DIAGNOSIS — N183 Chronic kidney disease, stage 3 (moderate): Secondary | ICD-10-CM | POA: Diagnosis present

## 2014-07-09 DIAGNOSIS — W19XXXA Unspecified fall, initial encounter: Secondary | ICD-10-CM | POA: Insufficient documentation

## 2014-07-09 DIAGNOSIS — Y92009 Unspecified place in unspecified non-institutional (private) residence as the place of occurrence of the external cause: Secondary | ICD-10-CM | POA: Diagnosis not present

## 2014-07-09 DIAGNOSIS — I5042 Chronic combined systolic (congestive) and diastolic (congestive) heart failure: Secondary | ICD-10-CM | POA: Diagnosis present

## 2014-07-09 DIAGNOSIS — Z86718 Personal history of other venous thrombosis and embolism: Secondary | ICD-10-CM | POA: Diagnosis not present

## 2014-07-09 DIAGNOSIS — D649 Anemia, unspecified: Secondary | ICD-10-CM

## 2014-07-09 DIAGNOSIS — Z7901 Long term (current) use of anticoagulants: Secondary | ICD-10-CM | POA: Diagnosis not present

## 2014-07-09 DIAGNOSIS — Z6826 Body mass index (BMI) 26.0-26.9, adult: Secondary | ICD-10-CM | POA: Diagnosis not present

## 2014-07-09 DIAGNOSIS — F1020 Alcohol dependence, uncomplicated: Secondary | ICD-10-CM | POA: Diagnosis present

## 2014-07-09 DIAGNOSIS — I255 Ischemic cardiomyopathy: Secondary | ICD-10-CM | POA: Diagnosis present

## 2014-07-09 DIAGNOSIS — I129 Hypertensive chronic kidney disease with stage 1 through stage 4 chronic kidney disease, or unspecified chronic kidney disease: Secondary | ICD-10-CM | POA: Diagnosis present

## 2014-07-09 DIAGNOSIS — Z79899 Other long term (current) drug therapy: Secondary | ICD-10-CM | POA: Diagnosis not present

## 2014-07-09 DIAGNOSIS — K219 Gastro-esophageal reflux disease without esophagitis: Secondary | ICD-10-CM | POA: Diagnosis present

## 2014-07-09 DIAGNOSIS — N400 Enlarged prostate without lower urinary tract symptoms: Secondary | ICD-10-CM | POA: Diagnosis present

## 2014-07-09 DIAGNOSIS — R296 Repeated falls: Secondary | ICD-10-CM | POA: Diagnosis present

## 2014-07-09 DIAGNOSIS — I714 Abdominal aortic aneurysm, without rupture: Secondary | ICD-10-CM | POA: Diagnosis present

## 2014-07-09 DIAGNOSIS — Z96641 Presence of right artificial hip joint: Secondary | ICD-10-CM | POA: Diagnosis present

## 2014-07-09 LAB — CBC
HCT: 21.4 % — ABNORMAL LOW (ref 39.0–52.0)
HCT: 21.4 % — ABNORMAL LOW (ref 39.0–52.0)
Hemoglobin: 7.1 g/dL — ABNORMAL LOW (ref 13.0–17.0)
Hemoglobin: 7.2 g/dL — ABNORMAL LOW (ref 13.0–17.0)
MCH: 31.1 pg (ref 26.0–34.0)
MCH: 30.8 pg (ref 26.0–34.0)
MCHC: 33.2 g/dL (ref 30.0–36.0)
MCHC: 33.6 g/dL (ref 30.0–36.0)
MCV: 93.9 fL (ref 78.0–100.0)
MCV: 91.5 fL (ref 78.0–100.0)
PLATELETS: 110 10*3/uL — AB (ref 150–400)
Platelets: 115 10*3/uL — ABNORMAL LOW (ref 150–400)
RBC: 2.28 MIL/uL — ABNORMAL LOW (ref 4.22–5.81)
RBC: 2.34 MIL/uL — ABNORMAL LOW (ref 4.22–5.81)
RDW: 14.4 % (ref 11.5–15.5)
RDW: 14.3 % (ref 11.5–15.5)
WBC: 7.2 10*3/uL (ref 4.0–10.5)
WBC: 9 10*3/uL (ref 4.0–10.5)

## 2014-07-09 LAB — HEMOGLOBIN AND HEMATOCRIT, BLOOD
HCT: 19.9 % — ABNORMAL LOW (ref 39.0–52.0)
Hemoglobin: 6.7 g/dL — CL (ref 13.0–17.0)

## 2014-07-09 LAB — PREPARE RBC (CROSSMATCH)

## 2014-07-09 MED ORDER — BACITRACIN ZINC 500 UNIT/GM EX OINT
1.0000 | TOPICAL_OINTMENT | Freq: Two times a day (BID) | CUTANEOUS | Status: DC
Start: 2014-07-09 — End: 2014-07-12
  Administered 2014-07-09 – 2014-07-12 (×7): 1 via TOPICAL
  Filled 2014-07-09 (×2): qty 28.35

## 2014-07-09 MED ORDER — ENSURE COMPLETE PO LIQD
237.0000 mL | Freq: Two times a day (BID) | ORAL | Status: DC
  Administered 2014-07-10 – 2014-07-12 (×5): 237 mL via ORAL

## 2014-07-09 MED ORDER — VITAMIN K1 10 MG/ML IJ SOLN
5.0000 mg | Freq: Once | INTRAMUSCULAR | Status: DC
  Filled 2014-07-09: qty 0.5

## 2014-07-09 MED ORDER — IOHEXOL 300 MG/ML  SOLN: 50.0000 mL | Freq: Once | INTRAMUSCULAR | Status: AC | PRN

## 2014-07-09 MED ORDER — BUPIVACAINE HCL (PF) 0.25 % IJ SOLN
30.0000 mL | Freq: Once | INTRAMUSCULAR | Status: DC
  Filled 2014-07-09: qty 30

## 2014-07-09 MED ORDER — SODIUM CHLORIDE 0.9 % IV SOLN: Freq: Once | INTRAVENOUS | Status: DC

## 2014-07-09 MED ORDER — SODIUM CHLORIDE 0.9 % IV SOLN
Freq: Once | INTRAVENOUS | Status: AC
Start: 2014-07-09 — End: 2014-07-09
  Administered 2014-07-09: via INTRAVENOUS

## 2014-07-09 MED ORDER — IOHEXOL 300 MG/ML  SOLN
80.0000 mL | Freq: Once | INTRAMUSCULAR | Status: AC | PRN
  Administered 2014-07-09: 80 mL via INTRAVENOUS

## 2014-07-09 MED ORDER — SODIUM CHLORIDE 0.9 % IV SOLN
Freq: Once | INTRAVENOUS | Status: AC
  Administered 2014-07-09: 11:00:00 via INTRAVENOUS

## 2014-07-09 NOTE — Progress Notes (Signed)
Blood transfusion started. Transfusion reaction reviewed with patient once again with teach back. VSS. Blood verified with 2nd RN. Will continue to monitor.  Sim Boast, RN

## 2014-07-09 NOTE — Progress Notes (Signed)
PT Cancellation Note  Patient Details Name: Phillip Kelley MRN: [id number] DOB: 1926-09-19   Cancelled Treatment:    Reason Eval/Treat Not Completed: Medical issues which prohibited therapy.  Pt back continues to bleed profusely.  In the middle of a dressing change.  RN asks to defer today. 07/09/2014  Eddystone Bing, PT [phone]  (pager)   Treg Diemer, Eliseo Gum 07/09/2014, 3:18 PM

## 2014-07-09 NOTE — Progress Notes (Signed)
OT Cancellation Note  Patient Details Name: Phillip Kelley MRN: [id number] DOB: 03-17-1927   Cancelled Treatment:    Reason Eval/Treat Not Completed: Patient not medically ready. Pt currently on bedrest. Please update activity orders when appropriate for OT evaluation.  Earlie Raveling OTR/L [phone] 07/09/2014, 8:43 AM

## 2014-07-09 NOTE — Progress Notes (Signed)
TELE box 11 applied and verified, Cross matched drawn by lab, awaiting for blood to be ready for pickup. Inform patient r/t blood transfusion. Transfusion reactions reviewed with patient  teach back. Patient verbalized understanding.   Sim Boast, RN

## 2014-07-09 NOTE — Progress Notes (Signed)
[facility] PROGRESS NOTE  Forest GleasonGeorge W Pickering ZOX:[id number] DOB: 26-May-1927 DOA: 07/08/2014 PCP: Eartha InchBADGER,MICHAEL C, MD  Assessment/Plan:   Back Skin Tear -Patient on chronic Xarelto for A. Fib.  He fell at home,landed onto his back and head, and has sustained two large skin tear on his back that were actively bleeding. -CT head-without acute abnormalities -S/p 2 U FFP for reversal.  - Hgb significantly dropped from 10.1 to 6.7.  Will order 1 U PRBC -Trauma surgery following, follow rec's- hold Xarelto 12/23-24/15.  CT abd/pelvis to r/o hemiperitonium.  Continue non-adherent dressing, then transition to Mepilex once oozing stops. -Continue antibiotic ointment twice a day  -Continue IV morphine, Oxy IR PRN   Right shoulder pain Patient with right upper quadrant tenderness to palpation -shoulder x-ray- findings questionable for scapular fracture -Trauma followin- rec's- we'll obtain x-ray to rule out bony involvement,   Anemia -Patient with significant bleeding from tears in his back- Currently bleeding has stopped.   -Hgb dropped from 10.1 >> 7.1>> 6.7; -Transfuse 1 U of PRBC.   S/p 2 U FFP on 07/09/14.   -INR supratherapuetic at 2.57 -PerTrauma surgery rec's - will obtain CT abd/pelvis to rule out hemoperitoneum  -Monitor H/H q 8hrs, repeat INR in am -Continue daily folic acid   Chronic Systolic Heart Failure -Appears euvolemic -2D echo 09/2012- EF 65-70%, grade I diastolic dysfunction -Continue Lasix 20 MG daily  CKD, stage III -Creatinine at 1.60, baseline around 1.5 -Repeat BMET in am   HTN -BP stable -Continue hydralazine 25 MG twice a day   Atrial fibrillation -Rate controlled -Xarelto held due to bleeding.  Fall -He lives at home with his wife and uses a walker for ambulation. -PT/OTeval/  Nutrition consult pending  Alcohol Abuse -Pt endorses drinking much alcohol -Continue Ativan per CIWA protocol, Thiamine daily -Counseled on alcohol  cessation  Constipation Continue MiraLAX, Senokot PRN  BPH -Continue Flomax 0.4 MG twice a day   DVT Prophylaxis SCDs Code Status: Full Family Communication: No family at bedside Disposition Plan: Inpatient   Consultants:  Gen Surgery  Procedures:  None  Antibiotics:  None  HPI/Subjective: Phillip Kelley is a 80 yo male with PMH of atrial fibrillatio(on Xarelto),CK D stage III,  hypertension, chronic systolic CHF, heart block (s/p pacemaker), anemia, arthritis, and alcohol abuse,  that presented to the ED after he sustained a fall at home.  He states he lost balance while trying to get to his walker, fell backwards onto a rug, and landed on his back and head. He sustained 2 large wounds to his back where he is bleeding constantly.   He denies loss of consciousness,dizziness, headache, chest pain, or shortness of breath.  Patient lives at home with his wife and uses a walker for ambulation.   Complaints of some back pain, denies any dizziness, lightheadedness, chest pain, shortness of breath. Objective: Filed Vitals:   07/09/14 1135  BP: 115/51  Pulse: 66  Temp: 98 F (36.7 C)  Resp: 18    Intake/Output Summary (Last 24 hours) at 07/09/14 1245 Last data filed at 07/09/14 1120  Gross per 24 hour  Intake    630 ml  Output      0 ml  Net    630 ml   Filed Weights   07/08/14 2212  Weight: 69.7 kg (153 lb 10.6 oz)    Exam:  Gen: Alert Caucasian male in NAD.  HEENT: Normocephalic, atraumatic.  Pupils symmertrical.  Moist mucosa.   Chest: clear to auscultate  bilaterally, no ronchi or rales  Cardiac: Regular rate and rhythm, S1-S2, no rubs murmurs or gallops  Abdomen: soft, non tender, non distended, +bowel sounds. No guarding or rigidity, with condom catheter  Extremities: Back-diffuse ecchymosis, with 2 large skin tears with bleeding  RLE with bandaged area.  Neurological: Alert awake oriented to time place and person.  Skin: numerous ecchymosis and thin  skin  Data Reviewed: Basic Metabolic Panel:  Recent Labs Lab 07/08/14 1532  NA 142  K 4.5  CL 107  CO2 26  GLUCOSE 99  BUN 33*  CREATININE 1.60*  CALCIUM 9.0   Liver Function Tests: No results for input(s): AST, ALT, ALKPHOS, BILITOT, PROT, ALBUMIN in the last 168 hours. No results for input(s): LIPASE, AMYLASE in the last 168 hours. No results for input(s): AMMONIA in the last 168 hours. CBC:  Recent Labs Lab 07/08/14 1532 07/09/14 0635  WBC 9.5 7.2  NEUTROABS 6.8  --   HGB 10.1* 7.1*  HCT 30.6* 21.4*  MCV 92.7 93.9  PLT 135* 115*   Cardiac Enzymes: No results for input(s): CKTOTAL, CKMB, CKMBINDEX, TROPONINI in the last 168 hours. BNP (last 3 results) No results for input(s): PROBNP in the last 8760 hours. CBG: No results for input(s): GLUCAP in the last 168 hours.  No results found for this or any previous visit (from the past 240 hour(s)).   Studies: Ct Head Wo Contrast  07/08/2014   CLINICAL DATA:  80 year old male with history of trauma from a fall backwards. Patient on Xarelto. Currently unable to straighten is intact.  EXAM: CT HEAD WITHOUT CONTRAST  TECHNIQUE: Contiguous axial images were obtained from the base of the skull through the vertex without intravenous contrast.  COMPARISON:  Head CT 12/17/2012.  FINDINGS: Mild cerebral and cerebellar atrophy. Patchy and confluent areas of decreased attenuation are noted throughout the deep and periventricular white matter of the cerebral hemispheres bilaterally, compatible with chronic microvascular ischemic disease. Well-defined foci of low attenuation in the right basal ganglia, compatible with old lacunar infarcts (unchanged). No acute displaced skull fractures are identified. No acute intracranial abnormality. Specifically, no evidence of acute post-traumatic intracranial hemorrhage, no definite regions of acute/subacute cerebral ischemia, no focal mass, mass effect, hydrocephalus or abnormal intra or extra-axial  fluid collections. Right mastoids are well pneumatized. Status post left mastoidectomy. Paranasal sinuses are generally well pneumatized, with exception of some multifocal mucosal thickening in the ethmoids bilaterally, probable inspissated secretions in the right frontoethmoidal recess, and polypoid lesions in the right maxillary sinus which may represent polyps or mucosal retention cysts.  IMPRESSION: 1. No signs of significant acute traumatic injury to the skull or brain. 2. Mild cerebral and cerebellar atrophy with chronic microvascular ischemic changes in the cerebral white matter and old right basal ganglia lacunar infarcts, similar to the prior study. 3. Paranasal sinus disease, as above.   Electronically Signed   By: Trudie Reed M.D.   On: 07/08/2014 19:40    Scheduled Meds: . bacitracin  1 application Topical BID  . folic acid  1 mg Oral Daily  . furosemide  20 mg Oral Daily  . hydrALAZINE  25 mg Oral BID  .  morphine injection  4 mg Intravenous Once  . multivitamin with minerals  1 tablet Oral Daily  . senna  1 tablet Oral BID  . sodium chloride  3 mL Intravenous Q12H  . tamsulosin  0.4 mg Oral BID  . thiamine  100 mg Oral Daily   Or  .  thiamine  100 mg Intravenous Daily   Continuous Infusions:   Principal Problem:   Bleeding Active Problems:   Balance problems   Chronic systolic heart failure   Pacemaker-BiV Medtronic   Atrial fibrillation   Alcohol abuse   CKD (chronic kidney disease) stage 3, GFR 30-59 ml/min   Normocytic anemia   Chronic diastolic heart failure    Time spent: 45    Illa [facility]  [facility] Pager [phone]. If 7PM-7AM, please contact night-coverage at [email or web address], password [facility] 07/09/2014, 12:45 PM  LOS: 1 day

## 2014-07-09 NOTE — Progress Notes (Signed)
MD notified of pt's current HG-7.1. MD also made aware of wound on pt's back. MD bedside to evaluate. Will continue to monitor closely.

## 2014-07-09 NOTE — Progress Notes (Signed)
Patient blood is ready for pickup at this time but patient is leaving the floor to CT. Will start blood transfusion once patient return to floor.   Sim Boast, RN

## 2014-07-09 NOTE — Progress Notes (Signed)
CRITICAL VALUE ALERT  Critical value received: 1435  time of notification:  1436  Time of notification:          Critical value read back:Yes.    Nurse who received alert:  Diona Browner RN    MD notified (1st page):Dhungel Time of first page:  1442  MD notified (2nd page):  Time of second page:  Responding MD:    Time MD responded:  1440

## 2014-07-09 NOTE — Progress Notes (Signed)
Report given to Havy RN 

## 2014-07-09 NOTE — Progress Notes (Signed)
INITIAL NUTRITION ASSESSMENT  DOCUMENTATION CODES Per approved criteria  -Severe malnutrition in the context of acute illness or injury   Pt meets criteria for severe MALNUTRITION in the context of acute illness as evidenced by mild to moderate fat and muscle depletion.  INTERVENTION: -Ensure Complete po BID, each supplement provides 350 kcal and 13 grams of protein -Please ensure pt has feeding assistance with meals  NUTRITION DIAGNOSIS: Inadequate oral intake related to self-feeding difficulty as evidenced by mild to moderate fat and muscle depletion, diet hx.   Goal: Pt will meet >90% of estimated nutritional needs  Monitor:  PO/supplement intake, labs, weight changes, I/O's  Reason for Assessment: Consult to assess needs  80 y.o. male  Admitting Dx: Bleeding  Phillip Kelley is a 80 yo male with PMH of atrial fibrillatio(on Xarelto),CK D stage III, hypertension, chronic systolic CHF, heart block (s/p pacemaker), anemia, arthritis, and alcohol abuse, that presented to the ED after he sustained a fall at home.   ASSESSMENT: Pt reports a general decline in health over the past 3-4 weeks. He reveals that he was in his usual state of health prior to this; he admits he was able to walk, but currently has trouble getting out of bed.  He admits that he has a previous hx of weight loss, but weight has been stable over the past year. Confirmed UBW around 150#. He reports "I haven't been getting the fatty foods I need". He reveals his biggest barrier is the ability to feed himself ("I can't get the food to my mouth"). Noted 100% meal completion. He reports he drinks Boost at home when his intake is poor, but is not consistent in use. Educated pt on importance of good PO and supplement intake to promote healing. Pt would like to continue supplements while in the hospital.  Labs reviewed. BUN/Creat: 33/1.60.  Nutrition Focused Physical Exam:  Subcutaneous Fat:  Orbital Region: moderate  depletion Upper Arm Region: moderate depletion Thoracic and Lumbar Region: severe depletion  Muscle:  Temple Region: moderate depletion Clavicle Bone Region: moderate depletion Clavicle and Acromion Bone Region: moderate depletion Scapular Bone Region: moderate depletion Dorsal Hand: mild depletion Patellar Region: moderate depletion Anterior Thigh Region: moderate depletion Posterior Calf Region: moderate depletion  Edema: none present  Height: Ht Readings from Last 1 Encounters:  07/08/14 5\' 5"  (1.651 m)    Weight: Wt Readings from Last 1 Encounters:  07/08/14 153 lb 10.6 oz (69.7 kg)    Ideal Body Weight: 136#  % Ideal Body Weight: 113%  Wt Readings from Last 10 Encounters:  07/08/14 153 lb 10.6 oz (69.7 kg)  05/07/14 148 lb 9.6 oz (67.405 kg)  10/31/13 150 lb (68.04 kg)  02/19/13 152 lb 12.8 oz (69.31 kg)  11/29/12 153 lb (69.4 kg)  11/15/12 157 lb 6.4 oz (71.396 kg)  09/12/12 156 lb (70.761 kg)  04/17/12 151 lb (68.493 kg)  03/12/12 156 lb (70.761 kg)  01/26/12 150 lb 9.2 oz (68.3 kg)    Usual Body Weight: 150#  % Usual Body Weight: 102%  BMI:  Body mass index is 25.57 kg/(m^2). Overweight  Estimated Nutritional Needs: Kcal: 1800-2000 Protein: 84-94 grams Fluid: 1.8-2.0 L  Skin: ecchymosis, open areas on anterior left leg, right lower back, and rt upper back  Diet Order: Diet Heart  EDUCATION NEEDS: -Education needs addressed   Intake/Output Summary (Last 24 hours) at 07/09/14 1618 Last data filed at 07/09/14 1513  Gross per 24 hour  Intake   1050 ml  Output    500 ml  Net    550 ml    Last BM: 07/08/14  Labs:   Recent Labs Lab 07/08/14 1532  NA 142  K 4.5  CL 107  CO2 26  BUN 33*  CREATININE 1.60*  CALCIUM 9.0  GLUCOSE 99    CBG (last 3)  No results for input(s): GLUCAP in the last 72 hours.  Scheduled Meds: . sodium chloride   Intravenous Once  . sodium chloride   Intravenous Once  . bacitracin  1 application Topical  BID  . folic acid  1 mg Oral Daily  . furosemide  20 mg Oral Daily  . hydrALAZINE  25 mg Oral BID  .  morphine injection  4 mg Intravenous Once  . multivitamin with minerals  1 tablet Oral Daily  . senna  1 tablet Oral BID  . sodium chloride  3 mL Intravenous Q12H  . tamsulosin  0.4 mg Oral BID  . thiamine  100 mg Oral Daily    Continuous Infusions:   Past Medical History  Diagnosis Date  . Cataract     "right needs taken out" (01/25/12)  . Hypertension   . Chronic systolic CHF (congestive heart failure)   . Symptomatic bradycardia 01/25/12    s/p Medtronic BiV PPM  . Anemia     "my RBC's are low"  . History of blood transfusion 2001    "w/hip operation"  . Arthritis     "all over"  . Chronic lower back pain     "arthritis"  . Heart block     s/p Medtronic BiV PPM  . Leg edema   . DVT (deep vein thrombosis) in pregnancy     Past Surgical History  Procedure Laterality Date  . Anterior fusion cervical spine  2001    for numbness in right arm, left with anhydrosis  . Total hip arthroplasty  2001    right; fell  . Carotid endarterectomy  ~ 2008    left  . Carpal tunnel release      bilaterally  . Insert / replace / remove pacemaker  01/25/12    Medtronic BiV PPM  . Tonsillectomy and adenoidectomy  ~ 1933  . Joint replacement    . Cataract extraction w/ intraocular lens implant  ~2011    left  . Ganglion cyst excision      ? left  . Bi-ventricular pacemaker insertion N/A 01/25/2012    Procedure: BI-VENTRICULAR PACEMAKER INSERTION (CRT-P);  Surgeon: Marinus Maw, MD;  Location: [facility] CATH LAB;  Service: Cardiovascular;  Laterality: N/A;    Georgeann Brinkman A. Mayford Knife, RD, LDN Pager: [phone] After hours Pager: [phone]

## 2014-07-09 NOTE — Consult Note (Signed)
Reason for Consult:Back wounds Referring Physician: Artist Beach  Phillip Kelley is an 80 y.o. male.  HPI: Phillip Kelley fell on 12/21 while ambulating with his walker at home where he lives with his wife. He says he just lost his balance and denies syncope or dizziness. He fell backwards striking his back and the back of his head. He did not lose consciousness. He did not seek help until the next day and only because his back continued to bleed. He takes Xarelto and did take one yesterday morning despite the bleeding. He says he's on that due to a femoral clot discovered 2 years ago though he claims he has never followed up. He also c/o right shoulder pain that he says has been present since the fall and no one has addressed.  Past Medical History  Diagnosis Date  . Cataract     "right needs taken out" (01/25/12)  . Hypertension   . Chronic systolic CHF (congestive heart failure)   . Symptomatic bradycardia 01/25/12    s/p Medtronic BiV PPM  . Anemia     "my RBC's are low"  . History of blood transfusion 2001    "w/hip operation"  . Arthritis     "all over"  . Chronic lower back pain     "arthritis"  . Heart block     s/p Medtronic BiV PPM  . Leg edema   . DVT (deep vein thrombosis) in pregnancy     Past Surgical History  Procedure Laterality Date  . Anterior fusion cervical spine  2001    for numbness in right arm, left with anhydrosis  . Total hip arthroplasty  2001    right; fell  . Carotid endarterectomy  ~ 2008    left  . Carpal tunnel release      bilaterally  . Insert / replace / remove pacemaker  01/25/12    Medtronic BiV PPM  . Tonsillectomy and adenoidectomy  ~ 1933  . Joint replacement    . Cataract extraction w/ intraocular lens implant  ~2011    left  . Ganglion cyst excision      ? left  . Bi-ventricular pacemaker insertion N/A 01/25/2012    Procedure: BI-VENTRICULAR PACEMAKER INSERTION (CRT-P);  Surgeon: Evans Lance, MD;  Location: [facility] CATH LAB;  Service:  Cardiovascular;  Laterality: N/A;    Family History  Problem Relation Age of Onset  . Congestive Heart Failure Mother     Social History:  reports that he quit smoking about 36 years ago. His smoking use included Cigarettes. He has a 35 pack-year smoking history. He has never used smokeless tobacco. He reports that he drinks about 12.6 oz of alcohol per week. His drug history is not on file.  Allergies:  Allergies  Allergen Reactions  . Penicillins Rash  . Sulfa Antibiotics Rash  . Losartan     Hyperkalemia     Medications: I have reviewed the patient's current medications.  Results for orders placed or performed during the hospital encounter of 07/08/14 (from the past 48 hour(s))  CBC with Differential     Status: Abnormal   Collection Time: 07/08/14  3:32 PM  Result Value Ref Range   WBC 9.5 4.0 - 10.5 K/uL   RBC 3.30 (L) 4.22 - 5.81 MIL/uL   Hemoglobin 10.1 (L) 13.0 - 17.0 g/dL   HCT 30.6 (L) 39.0 - 52.0 %   MCV 92.7 78.0 - 100.0 fL   MCH 30.6 26.0 - 34.0 pg  MCHC 33.0 30.0 - 36.0 g/dL   RDW 14.3 11.5 - 15.5 %   Platelets 135 (L) 150 - 400 K/uL   Neutrophils Relative % 72 43 - 77 %   Neutro Abs 6.8 1.7 - 7.7 K/uL   Lymphocytes Relative 11 (L) 12 - 46 %   Lymphs Abs 1.0 0.7 - 4.0 K/uL   Monocytes Relative 15 (H) 3 - 12 %   Monocytes Absolute 1.4 (H) 0.1 - 1.0 K/uL   Eosinophils Relative 2 0 - 5 %   Eosinophils Absolute 0.2 0.0 - 0.7 K/uL   Basophils Relative 0 0 - 1 %   Basophils Absolute 0.0 0.0 - 0.1 K/uL  Basic metabolic panel     Status: Abnormal   Collection Time: 07/08/14  3:32 PM  Result Value Ref Range   Sodium 142 135 - 145 mmol/L    Comment: Please note change in reference range.   Potassium 4.5 3.5 - 5.1 mmol/L    Comment: Please note change in reference range.   Chloride 107 96 - 112 mEq/L   CO2 26 19 - 32 mmol/L   Glucose, Bld 99 70 - 99 mg/dL   BUN 33 (H) 6 - 23 mg/dL   Creatinine, Ser 1.60 (H) 0.50 - 1.35 mg/dL   Calcium 9.0 8.4 - 10.5 mg/dL     GFR calc non Af Amer 38 (L) >90 mL/min   GFR calc Af Amer 44 (L) >90 mL/min    Comment: (NOTE) The eGFR has been calculated using the CKD EPI equation. This calculation has not been validated in all clinical situations. eGFR's persistently <90 mL/min signify possible Chronic Kidney Disease.    Anion gap 9 5 - 15  Protime-INR     Status: Abnormal   Collection Time: 07/08/14  3:32 PM  Result Value Ref Range   Prothrombin Time 27.8 (H) 11.6 - 15.2 seconds   INR 2.57 (H) 0.00 - 1.49  CBC     Status: Abnormal   Collection Time: 07/09/14  6:35 AM  Result Value Ref Range   WBC 7.2 4.0 - 10.5 K/uL   RBC 2.28 (L) 4.22 - 5.81 MIL/uL   Hemoglobin 7.1 (L) 13.0 - 17.0 g/dL    Comment: REPEATED TO VERIFY SPECIMEN CHECKED FOR CLOTS    HCT 21.4 (L) 39.0 - 52.0 %   MCV 93.9 78.0 - 100.0 fL   MCH 31.1 26.0 - 34.0 pg   MCHC 33.2 30.0 - 36.0 g/dL   RDW 14.4 11.5 - 15.5 %   Platelets 115 (L) 150 - 400 K/uL    Comment: PLATELET COUNT CONFIRMED BY SMEAR REPEATED TO VERIFY   Prepare fresh frozen plasma     Status: None (Preliminary result)   Collection Time: 07/09/14  8:50 AM  Result Value Ref Range   Unit Number [id number]    Blood Component Type THAWED PLASMA    Unit division 00    Status of Unit ALLOCATED    Transfusion Status OK TO TRANSFUSE    Unit Number [id number]    Blood Component Type THAWED PLASMA    Unit division 00    Status of Unit ALLOCATED    Transfusion Status OK TO TRANSFUSE     Ct Head Wo Contrast  07/08/2014   CLINICAL DATA:  80 year old male with history of trauma from a fall backwards. Patient on Xarelto. Currently unable to straighten is intact.  EXAM: CT HEAD WITHOUT CONTRAST  TECHNIQUE: Contiguous axial images were obtained from the base  of the skull through the vertex without intravenous contrast.  COMPARISON:  Head CT 12/17/2012.  FINDINGS: Mild cerebral and cerebellar atrophy. Patchy and confluent areas of decreased attenuation are noted throughout the  deep and periventricular white matter of the cerebral hemispheres bilaterally, compatible with chronic microvascular ischemic disease. Well-defined foci of low attenuation in the right basal ganglia, compatible with old lacunar infarcts (unchanged). No acute displaced skull fractures are identified. No acute intracranial abnormality. Specifically, no evidence of acute post-traumatic intracranial hemorrhage, no definite regions of acute/subacute cerebral ischemia, no focal mass, mass effect, hydrocephalus or abnormal intra or extra-axial fluid collections. Right mastoids are well pneumatized. Status post left mastoidectomy. Paranasal sinuses are generally well pneumatized, with exception of some multifocal mucosal thickening in the ethmoids bilaterally, probable inspissated secretions in the right frontoethmoidal recess, and polypoid lesions in the right maxillary sinus which may represent polyps or mucosal retention cysts.  IMPRESSION: 1. No signs of significant acute traumatic injury to the skull or brain. 2. Mild cerebral and cerebellar atrophy with chronic microvascular ischemic changes in the cerebral white matter and old right basal ganglia lacunar infarcts, similar to the prior study. 3. Paranasal sinus disease, as above.   Electronically Signed   By: Vinnie Langton M.D.   On: 07/08/2014 19:40    Review of Systems  Constitutional: Negative for weight loss.  HENT: Negative for ear discharge, ear pain, hearing loss and tinnitus.   Eyes: Negative for blurred vision, double vision, photophobia and pain.  Respiratory: Negative for cough, sputum production and shortness of breath.   Cardiovascular: Negative for chest pain.  Gastrointestinal: Negative for nausea, vomiting and abdominal pain.  Genitourinary: Negative for dysuria, urgency, frequency and flank pain.  Musculoskeletal: Positive for joint pain (Right shoulder). Negative for myalgias, back pain, falls and neck pain.  Neurological: Negative for  dizziness, tingling, sensory change, focal weakness, loss of consciousness and headaches.  Endo/Heme/Allergies: Bruises/bleeds easily.  Psychiatric/Behavioral: Negative for depression, memory loss and substance abuse. The patient is not nervous/anxious.    Blood pressure 125/74, pulse 77, temperature 97.9 F (36.6 C), temperature source Oral, resp. rate 18, height $RemoveBe'5\' 5"'ItxUTZnsK$  (1.651 m), weight 153 lb 10.6 oz (69.7 kg), SpO2 95 %. Physical Exam  Constitutional: He appears well-developed. No distress.  HENT:  Head: Normocephalic and atraumatic.  Eyes: Conjunctivae are normal.  Neck: Normal range of motion. Neck supple.  GI: Soft. There is tenderness in the right upper quadrant. There is negative Murphy's sign.  Neurological: He is alert. GCS eye subscore is 4. GCS verbal subscore is 5. GCS motor subscore is 6.  Skin: Abrasion (2 large skin tears on right back) and ecchymosis (Extensive over back and BUE) noted. He is not diaphoretic.       Assessment/Plan: Back skin tears -- Agree with current wound care regimen of non-adherent dressings twice daily. There is no active bleeding currently, just a general ooze, which is what you would expect after being off of Xarelto for just over 24h now. I would hold Xarelto tomorrow as well. Once hemorrhagic oozing stops dressings can be converted to Mepilex or something similar to prevent further skin damage. I would also use abx ointment twice daily to aid in healing. Right shoulder pain -- Will get x-ray to r/o bony involvement. Kehr's sign seems unlikely but he did have some RUQ TTP and he has had a large drop in his hgb. Will get CT scan to r/o hemoperitoneum. Anticoagulated on Xarelto -- I trust that someone will look  seriously at the risk/benefit ratio of having this patient on anticoagulation and document as such. Might recommend change to reversible agent given likelihood of future falls.  Thank you for this consult.     Lisette Abu,  PA-C Pager: [phone] General Trauma PA Pager: [phone] 07/09/2014, 10:48 AM

## 2014-07-09 NOTE — Consult Note (Signed)
Reason for Consult: Right shoulder pain after fall Referring Physician:  General surgery, MD  Phillip Kelley is an 80 y.o. male.  HPI: Phillip Kelley fell on 12/21 while ambulating with his walker at home where he lives with his wife. He says he just lost his balance and denies syncope or dizziness. He fell backwards striking his back and the back of his head. He did not lose consciousness. He did not seek help until the next day and only because his back continued to bleed. He takes Xarelto and did take one yesterday morning despite the bleeding. He says he's on that due to a femoral clot discovered 2 years ago though he claims he has never followed up. He also c/o right shoulder pain that he says has been present since the fall and no one has addressed.  Upon further review in the hospital he had complaints of right shoulder pain.  X-rays ordered and Ortho consulted to evaluate shoulder pain Patient of Dr. [name] with advanced bilateral rotator cuff arthropathy.  Receives injections in shoulders every 3 months Feels that pain in right shoulder worse than arthritic pains  Past Medical History  Diagnosis Date  . Cataract     "right needs taken out" (01/25/12)  . Hypertension   . Chronic systolic CHF (congestive heart failure)   . Symptomatic bradycardia 01/25/12    s/p Medtronic BiV PPM  . Anemia     "my RBC's are low"  . History of blood transfusion 2001    "w/hip operation"  . Arthritis     "all over"  . Chronic lower back pain     "arthritis"  . Heart block     s/p Medtronic BiV PPM  . Leg edema   . DVT (deep vein thrombosis) in pregnancy     Past Surgical History  Procedure Laterality Date  . Anterior fusion cervical spine  2001    for numbness in right arm, left with anhydrosis  . Total hip arthroplasty  2001    right; fell  . Carotid endarterectomy  ~ 2008    left  . Carpal tunnel release      bilaterally  . Insert / replace / remove pacemaker  01/25/12    Medtronic BiV PPM   . Tonsillectomy and adenoidectomy  ~ 1933  . Joint replacement    . Cataract extraction w/ intraocular lens implant  ~2011    left  . Ganglion cyst excision      ? left  . Bi-ventricular pacemaker insertion N/A 01/25/2012    Procedure: BI-VENTRICULAR PACEMAKER INSERTION (CRT-P);  Surgeon: Marinus MawGregg W Taylor, MD;  [facility];  Service: Cardiovascular;  Laterality: N/A;    Family History  Problem Relation Age of Onset  . Congestive Heart Failure Mother     Social History:  reports that he quit smoking about 36 years ago. His smoking use included Cigarettes. He has a 35 pack-year smoking history. He has never used smokeless tobacco. He reports that he drinks about 12.6 oz of alcohol per week. His drug history is not on file.  Allergies:  Allergies  Allergen Reactions  . Penicillins Rash  . Sulfa Antibiotics Rash  . Losartan     Hyperkalemia     Medications:  I have reviewed the patient's current medications. Scheduled: . sodium chloride   Intravenous Once  . sodium chloride   Intravenous Once  . bacitracin  1 application Topical BID  . bupivacaine (PF)  30 mL Infiltration Once  . [  START ON 07/10/2014] feeding supplement (ENSURE COMPLETE)  237 mL Oral BID BM  . folic acid  1 mg Oral Daily  . furosemide  20 mg Oral Daily  . hydrALAZINE  25 mg Oral BID  .  morphine injection  4 mg Intravenous Once  . multivitamin with minerals  1 tablet Oral Daily  . senna  1 tablet Oral BID  . sodium chloride  3 mL Intravenous Q12H  . tamsulosin  0.4 mg Oral BID  . thiamine  100 mg Oral Daily    Results for orders placed or performed during the hospital encounter of 07/08/14 (from the past 24 hour(s))  CBC     Status: Abnormal   Collection Time: 07/09/14  6:35 AM  Result Value Ref Range   WBC 7.2 4.0 - 10.5 K/uL   RBC 2.28 (L) 4.22 - 5.81 MIL/uL   Hemoglobin 7.1 (L) 13.0 - 17.0 g/dL   HCT 46.9 (L) 62.9 - 52.8 %   MCV 93.9 78.0 - 100.0 fL   MCH 31.1 26.0 - 34.0 pg   MCHC 33.2  30.0 - 36.0 g/dL   RDW 41.3 24.4 - 01.0 %   Platelets 115 (L) 150 - 400 K/uL  Prepare fresh frozen plasma     Status: None (Preliminary result)   Collection Time: 07/09/14  8:50 AM  Result Value Ref Range   Unit Number [id number]    Blood Component Type THAWED PLASMA    Unit division 00    Status of Unit ISSUED    Transfusion Status OK TO TRANSFUSE    Unit Number [id number]    Blood Component Type THAWED PLASMA    Unit division 00    Status of Unit ISSUED    Transfusion Status OK TO TRANSFUSE   Hemoglobin and hematocrit, blood     Status: Abnormal   Collection Time: 07/09/14  1:54 PM  Result Value Ref Range   Hemoglobin 6.7 (LL) 13.0 - 17.0 g/dL   HCT 32.9 (L) 51.8 - 84.1 %  Prepare RBC     Status: None   Collection Time: 07/09/14  3:51 PM  Result Value Ref Range   Order Confirmation ORDER PROCESSED BY BLOOD BANK   Type and screen     Status: None (Preliminary result)   Collection Time: 07/09/14  3:51 PM  Result Value Ref Range   ABO/RH(D) A POS    Antibody Screen NEG    Sample Expiration 07/12/2014    Unit Number [id number]    Blood Component Type RED CELLS,LR    Unit division 00    Status of Unit ISSUED    Transfusion Status OK TO TRANSFUSE    Crossmatch Result Compatible     X-ray: CLINICAL DATA: Right shoulder pain post fall Monday evening  EXAM: RIGHT SHOULDER - 2+ VIEW  COMPARISON: 01/16/2012  FINDINGS: Study is limited by diffuse osteopenia. There is anterior medial subluxation of the humeral head from glenoid. Significant sclerosis noted right glenoid and humeral head. There is spurring of humeral head. There is cortical step-off within inferior aspect of the scapula. This is suspicious for scapular fracture of indeterminate age. Clinical correlation is necessary. Degenerative changes acromioclavicular joint.  IMPRESSION: There is anterior medial subluxation of the humeral head from glenoid. Significant sclerosis noted right glenoid and  humeral head. There is spurring of humeral head. There is cortical step-off within inferior aspect of the scapula. This is suspicious for scapular fracture of indeterminate age. Clinical correlation is necessary. Degenerative changes acromioclavicular  joint  ROS  Constitutional: Negative for weight loss.  HENT: Negative for ear discharge, ear pain, hearing loss and tinnitus.  Eyes: Negative for blurred vision, double vision, photophobia and pain.  Respiratory: Negative for cough, sputum production and shortness of breath.  Cardiovascular: Negative for chest pain.  Gastrointestinal: Negative for nausea, vomiting and abdominal pain.  Genitourinary: Negative for dysuria, urgency, frequency and flank pain.  Musculoskeletal: Positive for joint pain (Right shoulder). Negative for myalgias, back pain, falls and neck pain.  Neurological: Negative for dizziness, tingling, sensory change, focal weakness, loss of consciousness and headaches.  Endo/Heme/Allergies: Bruises/bleeds easily.  Psychiatric/Behavioral: Negative for depression, memory loss and substance abuse. The patient is not nervous/anxious.   Blood pressure 145/59, pulse 79, temperature 98.3 F (36.8 C), temperature source Oral, resp. rate 18, height 5\' 5"  (1.651 m), weight 69.7 kg (153 lb 10.6 oz), SpO2 94 %.  Physical Exam  Awake alert oriented, [place] O2 in place Relatively comfortable supine in bed  General medical exam reviewed for pertinent findings Back lacerations noted but furthered examined  Right UE similar in appearance to his left but pain with movement Left arm pain with movement but not as severe  NVI, stable  Assessment/Plan: Advanced right shoulder rotator cuff arthropathy with what appears to be anterior shoulder dislocation.  After review of X-rays from office visits this appears to be the case. Had planned to try an attempt at bedside reduction with intraarticular Marcaine injection but due to other trauma  tonight unable to get to at reasonable time.  I will ask my partner on tomorrow if he can give it a try versus taking him to the operating room  Materials ordered to bedside  Kerrick Miler D 07/09/2014, 8:37 PM

## 2014-07-09 NOTE — Progress Notes (Signed)
Hgb only up to 7.1 (was 6.7) after one unit PRBCs. Will go ahead and transfuse one more and continue q 8hr H/H checks. INR supra therapeutic on admission. Has received FFP and r/p INR to be drawn at 5am. Will follow.  Jimmye Norman, NP [facility]

## 2014-07-09 NOTE — Progress Notes (Signed)
MD notified of critical HG. New orders received.

## 2014-07-10 ENCOUNTER — Inpatient Hospital Stay (HOSPITAL_COMMUNITY): Payer: Medicare Other

## 2014-07-10 ENCOUNTER — Encounter (HOSPITAL_COMMUNITY): Payer: Self-pay | Admitting: *Deleted

## 2014-07-10 DIAGNOSIS — Z86718 Personal history of other venous thrombosis and embolism: Secondary | ICD-10-CM

## 2014-07-10 LAB — PREPARE FRESH FROZEN PLASMA
Unit division: 0
Unit division: 0

## 2014-07-10 LAB — HEMOGLOBIN AND HEMATOCRIT, BLOOD
HCT: 23.3 % — ABNORMAL LOW (ref 39.0–52.0)
HCT: 24.7 % — ABNORMAL LOW (ref 39.0–52.0)
HCT: 23.8 % — ABNORMAL LOW (ref 39.0–52.0)
HEMOGLOBIN: 8 g/dL — AB (ref 13.0–17.0)
Hemoglobin: 7.9 g/dL — ABNORMAL LOW (ref 13.0–17.0)
Hemoglobin: 8.3 g/dL — ABNORMAL LOW (ref 13.0–17.0)

## 2014-07-10 LAB — PROTIME-INR
INR: 1.39 (ref 0.00–1.49)
PROTHROMBIN TIME: 17.2 s — AB (ref 11.6–15.2)

## 2014-07-10 MED ORDER — DIPHENHYDRAMINE-ZINC ACETATE 2-0.1 % EX CREA
TOPICAL_CREAM | Freq: Two times a day (BID) | CUTANEOUS | Status: DC | PRN
  Administered 2014-07-11: 06:00:00 via TOPICAL
  Filled 2014-07-10: qty 28

## 2014-07-10 NOTE — Evaluation (Signed)
Physical Therapy Evaluation Patient Details Name: Phillip GleasonGeorge W Kelley MRN: [id number] DOB: 31-May-1927 Today's Date: 07/10/2014   History of Present Illness  Pt admitted to ED after having fallen 24 hours prior sustaining multiple ski tears and dislocating his R shoulder.  Actually came to ED due to unable to stop the bleeding  Clinical Impression  Pt admitted with/for fall with resultant skin tearing and bleeding that wouldn't stop .  Pt currently limited functionally due to the problems listed below.  (see problems list.)  Pt will benefit from PT to maximize function and safety to be able to get home safely with available assist of family and caregiver..     Follow Up Recommendations Home health PT;Other (comment) (pt prefers to head straight home post d/c)    Equipment Recommendations  None recommended by PT    Recommendations for Other Services       Precautions / Restrictions Precautions Precautions: Fall      Mobility  Bed Mobility Overal bed mobility: Needs Assistance Bed Mobility: Supine to Sit     Supine to sit: Mod assist     General bed mobility comments: assisted bridging to EOB and truncal assist up via L elbow.  Transfers Overall transfer level: Needs assistance Equipment used: Rolling walker (2 wheeled) Transfers: Sit to/from Stand Sit to Stand: Min assist         General transfer comment: cues for hand placement.  Most assist in form of coming forward.  Ambulation/Gait   Ambulation Distance (Feet): 50 Feet Assistive device: Rolling walker (2 wheeled) Gait Pattern/deviations: Step-through pattern;Step-to pattern;Decreased step length - right;Decreased stance time - left;Decreased stride length;Shuffle;Trunk flexed;Wide base of support Gait velocity: slow   General Gait Details: slow and effortful through out.  Stiff initially and then difficult due to fatigue.  Stairs            Wheelchair Mobility    Modified Rankin (Stroke Patients  Only)       Balance Overall balance assessment: Needs assistance Sitting-balance support: No upper extremity supported Sitting balance-Leahy Scale: Fair     Standing balance support: Bilateral upper extremity supported;During functional activity Standing balance-Leahy Scale: Poor Standing balance comment: tendency to list any number of directions depending on situation;                             Pertinent Vitals/Pain Pain Assessment: Faces Faces Pain Scale: Hurts even more Pain Location: R shoulder Pain Descriptors / Indicators: Grimacing Pain Intervention(s): Limited activity within patient's tolerance;Monitored during session    Home Living Family/patient expects to be discharged to:: Private residence Living Arrangements: Spouse/significant other Available Help at Discharge: Family;Personal care attendant (PCA 2 x/wk) Type of Home: House Home Access: Stairs to enter Entrance Stairs-Rails: Doctor, general practiceight;Left Entrance Stairs-Number of Steps: several Home Layout: One level Home Equipment: Environmental consultantWalker - 2 wheels;Tub bench;Other (comment) (comfort height toilet)      Prior Function Level of Independence: Independent;Needs assistance   Gait / Transfers Assistance Needed: uses RW for amb in a home environment           Hand Dominance        Extremity/Trunk Assessment   Upper Extremity Assessment: Defer to OT evaluation           Lower Extremity Assessment: Overall WFL for tasks assessed;Generalized weakness      Cervical / Trunk Assessment: Kyphotic (?scoliotic)  Communication   Communication: No difficulties  Cognition Arousal/Alertness: Awake/alert Behavior During  Therapy: WFL for tasks assessed/performed Overall Cognitive Status: Within Functional Limits for tasks assessed                      General Comments      Exercises        Assessment/Plan    PT Assessment Patient needs continued PT services  PT Diagnosis Difficulty  walking;Generalized weakness;Acute pain   PT Problem List Decreased strength;Decreased activity tolerance;Decreased balance;Decreased mobility;Decreased knowledge of use of DME;Pain  PT Treatment Interventions Gait training;Functional mobility training;Therapeutic activities;Balance training;Patient/family education;DME instruction;Stair training   PT Goals (Current goals can be found in the Care Plan section) Acute Rehab PT Goals Patient Stated Goal: go home as soon as possible. PT Goal Formulation: With patient Time For Goal Achievement: 07/17/14 Potential to Achieve Goals: Good    Frequency Min 3X/week   Barriers to discharge        Co-evaluation               End of Session   Activity Tolerance: Patient tolerated treatment well;Patient limited by fatigue Patient left: in chair;with call bell/phone within reach Nurse Communication: Mobility status         Time: 1230-1255 PT Time Calculation (min) (ACUTE ONLY): 25 min   Charges:   PT Evaluation $Initial PT Evaluation Tier I: 1 Procedure PT Treatments $Gait Training: 8-22 mins   PT G Codes:        Ezmeralda Stefanick, Eliseo Gum 07/10/2014, 1:08 PM 07/10/2014  Raysal Bing, PT [phone]  (pager)

## 2014-07-10 NOTE — Care Management Note (Signed)
    Page 1 of 1   07/12/2014     2:58:48 PM CARE MANAGEMENT NOTE 07/12/2014  Patient:  DONI, KUECHLE   Account Number:  [id number]  Date Initiated:  07/10/2014  Documentation initiated by:  Letha Cape  Subjective/Objective Assessment:   admit- lives with wife.     Action/Plan:   pt/ot eval-   Anticipated DC Date:  07/12/2014   Anticipated DC Plan:  HOME W HOME HEALTH SERVICES      DC Planning Services  CM consult      [facility]  HOME HEALTH   Choice offered to / List presented to:  C-3 Spouse        HH arranged  HH-1 RN  HH-2 PT  HH-3 OT  HH-4 NURSE'S AIDE      HH agency  [facility]   Status of service:  Completed, signed off Medicare Important Message given?  YES (If response is "NO", the following Medicare IM given date fields will be blank) Date Medicare IM given:  07/10/2014 Medicare IM given by:  Letha Cape Date Additional Medicare IM given:   Additional Medicare IM given by:    Discharge Disposition:  HOME W HOME HEALTH SERVICES  Per UR Regulation:    If discussed at Long Length of Stay Meetings, dates discussed:    Comments:  07/12/14 12:30 Pt foiund to be poor historian; CM called spouse, Thurston Hole [phone] who states they use [facility] and was "fairly" sure they were active with the agency. Referral called to [facility] and message left with rep, Corrie Dandy.  No other CM needs were communicated.  Freddy Jaksch, BSN, CM [phone].

## 2014-07-10 NOTE — Evaluation (Signed)
Occupational Therapy Evaluation Patient Details Name: Phillip GleasonGeorge W Elkhatib MRN: [id number] DOB: Mar 20, 1927 Today's Date: 07/10/2014    History of Present Illness Pt admitted to ED after having fallen 24 hours prior sustaining multiple ski tears and dislocating his R shoulder.  Actually came to ED due to unable to stop the bleeding   Clinical Impression   Pt was assisted for bathing and dressing prior to admission.  He was able to toilet at a modified independent level, bracing himself against the toilet to manage his pants. Pt's bathroom is not fully accessible with his RW and pt states he has had multiple near falls.  Pt has longstanding limitations in his L shoulder and severe kyphosis.  His R shoulder is currently dislocated anteriorly and is awaiting orthopedic intervention.  Pt presents with impaired balance and generalized weakness.  He is eager to go home.  Pt is heavily reliant on walker use at home. If shoulder precautions established by MD preclude use of his R UE, a home discharge plan may need to be reconsidered.    Follow Up Recommendations  Home health OT (depending on R shoulder recommendations of MD)    Equipment Recommendations  None recommended by OT    Recommendations for Other Services       Precautions / Restrictions Precautions Precautions: Fall Precaution Comments: hx of falls Restrictions Weight Bearing Restrictions: No      Mobility Bed Mobility Not tested  Transfers Overall transfer level: Needs assistance Equipment used: Rolling walker (2 wheeled) Transfers: Sit to/from Stand Sit to Stand: Min assist         General transfer comment: cues for hand placement.  Most assist in form of coming forward.    Balance Overall balance assessment: Needs assistance Sitting-balance support: No upper extremity supported Sitting balance-Leahy Scale: Fair     Standing balance support: Bilateral upper extremity supported;During functional activity Standing  balance-Leahy Scale: Poor Standing balance comment: tendency to list any number of directions depending on situation;                            ADL Overall ADL's : Needs assistance/impaired Eating/Feeding: Minimal assistance;Sitting Eating/Feeding Details (indicate cue type and reason): must use his L hand  Grooming: Wash/dry hands;Wash/dry face;Sitting;Minimal assistance   Upper Body Bathing: Moderate assistance;Sitting   Lower Body Bathing: Maximal assistance;Sit to/from stand   Upper Body Dressing : Sitting;Moderate assistance   Lower Body Dressing: Maximal assistance;Sit to/from stand   Toilet Transfer: Minimal assistance;Ambulation;BSC;RW   Toileting- Clothing Manipulation and Hygiene: Maximal assistance;Sit to/from stand       Functional mobility during ADLs: Minimal assistance;Rolling walker       Vision                     Perception     Praxis      Pertinent Vitals/Pain Pain Assessment: Faces Faces Pain Scale: Hurts even more Pain Location: R shoulder Pain Descriptors / Indicators: Guarding;Grimacing Pain Intervention(s): Limited activity within patient's tolerance;Monitored during session;Repositioned     Hand Dominance Right   Extremity/Trunk Assessment Upper Extremity Assessment Upper Extremity Assessment: RUE deficits/detail;LUE deficits/detail RUE Deficits / Details: pt with anterior dislocation of shoulder, awaiting ortho RUE: Unable to fully assess due to immobilization;Unable to fully assess due to pain RUE Coordination: decreased gross motor LUE Deficits / Details: longstanding limitations due to arthritis and dislocation in a prior fall, very kyphotic, crepitus in shoulder LUE: Unable to fully  assess due to immobilization LUE Coordination: decreased gross motor   Lower Extremity Assessment Lower Extremity Assessment: Defer to PT evaluation   Cervical / Trunk Assessment Cervical / Trunk Assessment: Kyphotic (severe)    Communication Communication Communication: No difficulties   Cognition Arousal/Alertness: Awake/alert Behavior During Therapy: WFL for tasks assessed/performed Overall Cognitive Status: Within Functional Limits for tasks assessed                     General Comments       Exercises Exercises: Other exercises (warm up LE ROM exercise.)     Shoulder Instructions      Home Living Family/patient expects to be discharged to:: Private residence Living Arrangements: Spouse/significant other Available Help at Discharge: Family;Personal care attendant (x 2 per week) Type of Home: House Home Access: Stairs to enter [employer] of Steps: several Entrance Stairs-Rails: Right;Left Home Layout: One level     Bathroom Shower/Tub: Chief Strategy Officer: Standard Bathroom Accessibility: No   Home Equipment: Environmental consultant - 2 wheels;Tub bench;Toilet riser;Grab bars - toilet;Grab bars - tub/shower;Cane - single point   Additional Comments: Pt uses a cane and grab bars to get to the toilet, walker does not fit.      Prior Functioning/Environment Level of Independence: Independent;Needs assistance  Gait / Transfers Assistance Needed: uses RW for amb in a home environment ADL's / Homemaking Assistance Needed: assisted by wife or personal care aide for bathing and dressing        OT Diagnosis: Generalized weakness;Acute pain   OT Problem List: Decreased strength;Decreased range of motion;Decreased activity tolerance;Impaired balance (sitting and/or standing);Decreased coordination;Pain;Impaired UE functional use;Decreased knowledge of precautions   OT Treatment/Interventions: Self-care/ADL training;Therapeutic exercise;Therapeutic activities;Patient/family education    OT Goals(Current goals can be found in the care plan section) Acute Rehab OT Goals Patient Stated Goal: go home as soon as possible. OT Goal Formulation: With patient Time For Goal Achievement:  07/24/14 Potential to Achieve Goals: Good ADL Goals Pt Will Perform Grooming: with supervision;standing Pt Will Perform Upper Body Bathing: with min assist;sitting Pt Will Perform Upper Body Dressing: with min assist;sitting Pt Will Transfer to Toilet: with supervision;ambulating;bedside commode (over toilet) Pt Will Perform Toileting - Clothing Manipulation and hygiene: with supervision;sit to/from stand Additional ADL Goal #1: Pt will adhere to R shoulder precautions as determined by MD during ADL and ADL transfers.  OT Frequency: Min 2X/week   Barriers to D/C:            Co-evaluation              End of Session    Activity Tolerance: Patient tolerated treatment well Patient left: in chair;with call bell/phone within reach   Time: 1245-1310 OT Time Calculation (min): 25 min Charges:  OT General Charges $OT Visit: 1 Procedure OT Evaluation $Initial OT Evaluation Tier I: 1 Procedure OT Treatments $Self Care/Home Management : 8-22 mins G-Codes:    Evern Bio 07/10/2014, 1:59 PM  [phone]

## 2014-07-10 NOTE — Progress Notes (Signed)
Spoke with patient's wife via the telephone, she would like for someone to call her and keep her informed of what's going on with the patient.

## 2014-07-10 NOTE — Progress Notes (Signed)
[facility] PROGRESS NOTE  Phillip Kelley NOI:[id number] DOB: 02-Jul-1927 DOA: 07/08/2014 PCP: [name], MD  Assessment/Plan: 80 y/o male with PMH of HTN, A fib, s/p PPM, CHF, CKD,  h/o DVT on xarelto, Gait ataxia, frequent falls had another episode of fall -admitted with severe large skin tear, dislocated shoulder, blood loss anemia   1. Fall, mechanical, frequent fall, gait ataxia chronic; CT head, abd: no acute fractures  -large skin tear, profuse bleeding on xarelto; Continue non-adherent dressing, then transition to Mepilex once oozing stops.Continue antibiotic ointment twice a day  -dislocated R shoulder; per ortho; bedside reduction planned for today  -will obtain PT eval;   2. H/o DVT; will obtain Leg [place] r/o DVT;  -patient is not a good candidate for anticoagulation due to risk off fall, bleeding  3. A fib, s/p PPM; HR stable; will hold xarelto;  -d/w patient, he reports frequent falls at home, uses walker; patient is not a good candidate for anticoagulation; will hold xarelto; will start ASA when bleeding stops 4. Acute blood loss anemia on xarelto; s/p 2 units FFP;  Tfsed 1 units PRBCs; monitor Tf prn  5. AAA ON ct, outpatient f/u with abd [place] 6. Chronic CHF-diastolic HF, echo (2014): 65-70%; clinically euvolemic; cont home regimen   7. Alcoholism, denies h/o withdrawals; no s/s of withdrawal on exam; cont monitor on CIWA;    Code Status: full Family Communication: d/w patient,  (indicate person spoken with, relationship, and if by phone, the number) Disposition Plan: pend PT   Consultants:  Trauma, ortho   Procedures:  None   Antibiotics:  none (indicate start date, and stop date if known)  HPI/Subjective: alert  Objective: Filed Vitals:   07/10/14 1046  BP: 121/60  Pulse: 72  Temp:   Resp:     Intake/Output Summary (Last 24 hours) at 07/10/14 1118 Last data filed at 07/10/14 0400  Gross per 24 hour  Intake   1750 ml  Output    900 ml   Net    850 ml   Filed Weights   07/08/14 2212  Weight: 69.7 kg (153 lb 10.6 oz)    Exam:   General:  alert  Cardiovascular: s1,s2 rrr  Respiratory: CTA BL  Abdomen: soft, nt,nd   Musculoskeletal: no no LE edema   Data Reviewed: Basic Metabolic Panel:  Recent Labs Lab 07/08/14 1532  NA 142  K 4.5  CL 107  CO2 26  GLUCOSE 99  BUN 33*  CREATININE 1.60*  CALCIUM 9.0   Liver Function Tests: No results for input(s): AST, ALT, ALKPHOS, BILITOT, PROT, ALBUMIN in the last 168 hours. No results for input(s): LIPASE, AMYLASE in the last 168 hours. No results for input(s): AMMONIA in the last 168 hours. CBC:  Recent Labs Lab 07/08/14 1532 07/09/14 0635 07/09/14 1354 07/09/14 2216 07/10/14 0605  WBC 9.5 7.2  --  9.0  --   NEUTROABS 6.8  --   --   --   --   HGB 10.1* 7.1* 6.7* 7.2* 7.9*  HCT 30.6* 21.4* 19.9* 21.4* 23.3*  MCV 92.7 93.9  --  91.5  --   PLT 135* 115*  --  110*  --    Cardiac Enzymes: No results for input(s): CKTOTAL, CKMB, CKMBINDEX, TROPONINI in the last 168 hours. BNP (last 3 results) No results for input(s): PROBNP in the last 8760 hours. CBG: No results for input(s): GLUCAP in the last 168 hours.  No results found for this or any  previous visit (from the past 240 hour(s)).   Studies: Dg Shoulder Right  07/09/2014   CLINICAL DATA:  Right shoulder pain post fall Monday evening  EXAM: RIGHT SHOULDER - 2+ VIEW  COMPARISON:  01/16/2012  FINDINGS: Study is limited by diffuse osteopenia. There is anterior medial subluxation of the humeral head from glenoid. Significant sclerosis noted right glenoid and humeral head. There is spurring of humeral head. There is cortical step-off within inferior aspect of the scapula. This is suspicious for scapular fracture of indeterminate age. Clinical correlation is necessary. Degenerative changes acromioclavicular joint.  IMPRESSION: There is anterior medial subluxation of the humeral head from glenoid. Significant  sclerosis noted right glenoid and humeral head. There is spurring of humeral head. There is cortical step-off within inferior aspect of the scapula. This is suspicious for scapular fracture of indeterminate age. Clinical correlation is necessary. Degenerative changes acromioclavicular joint.   Electronically Signed   By: Natasha Mead M.D.   On: 07/09/2014 13:00   Ct Head Wo Contrast  07/08/2014   CLINICAL DATA:  80 year old male with history of trauma from a fall backwards. Patient on Xarelto. Currently unable to straighten is intact.  EXAM: CT HEAD WITHOUT CONTRAST  TECHNIQUE: Contiguous axial images were obtained from the base of the skull through the vertex without intravenous contrast.  COMPARISON:  Head CT 12/17/2012.  FINDINGS: Mild cerebral and cerebellar atrophy. Patchy and confluent areas of decreased attenuation are noted throughout the deep and periventricular white matter of the cerebral hemispheres bilaterally, compatible with chronic microvascular ischemic disease. Well-defined foci of low attenuation in the right basal ganglia, compatible with old lacunar infarcts (unchanged). No acute displaced skull fractures are identified. No acute intracranial abnormality. Specifically, no evidence of acute post-traumatic intracranial hemorrhage, no definite regions of acute/subacute cerebral ischemia, no focal mass, mass effect, hydrocephalus or abnormal intra or extra-axial fluid collections. Right mastoids are well pneumatized. Status post left mastoidectomy. Paranasal sinuses are generally well pneumatized, with exception of some multifocal mucosal thickening in the ethmoids bilaterally, probable inspissated secretions in the right frontoethmoidal recess, and polypoid lesions in the right maxillary sinus which may represent polyps or mucosal retention cysts.  IMPRESSION: 1. No signs of significant acute traumatic injury to the skull or brain. 2. Mild cerebral and cerebellar atrophy with chronic  microvascular ischemic changes in the cerebral white matter and old right basal ganglia lacunar infarcts, similar to the prior study. 3. Paranasal sinus disease, as above.   Electronically Signed   By: Trudie Reed M.D.   On: 07/08/2014 19:40   Ct Abdomen Pelvis W Contrast  07/09/2014   CLINICAL DATA:  Right upper quadrant pain and tenderness  EXAM: CT ABDOMEN AND PELVIS WITH CONTRAST  TECHNIQUE: Multidetector CT imaging of the abdomen and pelvis was performed using the standard protocol following bolus administration of intravenous contrast.  CONTRAST:  80mL OMNIPAQUE IOHEXOL 300 MG/ML  SOLN  COMPARISON:  None.  FINDINGS: There is right lower lobe airspace disease likely reflecting atelectasis. There is mild left basilar atelectasis. There is multi vessel coronary artery atherosclerosis involving the left main, lad, circumflex and RCA. There is a cardiac pacer noted.  The liver demonstrates no focal abnormality. There is no intrahepatic or extrahepatic biliary ductal dilatation. The gallbladder is normal. The spleen demonstrates no focal abnormality.Bilateral kidneys are atrophic. There is a 16 x 15 mm hypodense left renal interpolar mass which is incompletely characterized on the current exam. The adrenal glands and pancreas are normal. The bladder is unremarkable.  The stomach, duodenum, small intestine, and large intestine demonstrate no contrast extravasation or dilatation. Incidental note is made of a duodenal diverticulum. There is diverticulosis without evidence of diverticulitis. There is no pneumoperitoneum, pneumatosis, or portal venous gas. There is no abdominal or pelvic free fluid. There is no lymphadenopathy.  There is an infrarenal abdominal aortic aneurysm measuring 3 cm in AP diameter. There is a right common iliac artery aneurysm measuring 2.4 cm in diameter. There is a right common iliac artery aneurysm measuring 2.1 cm. There is a left internal iliac artery aneurysm measuring 3.1 cm in  diameter.  There are no lytic or sclerotic osseous lesions. There is a right total hip arthroplasty noted with beam hardening artifact which partially obscures the adjacent soft tissue and osseous structures. There is severe degenerative disc disease and facet arthropathy throughout the thoracolumbar spine. There are severe arthritic changes of bilateral wrists.  IMPRESSION: 1. There is an infrarenal abdominal aortic aneurysm measuring 3 cm in AP diameter. There is a right common iliac artery aneurysm measuring 2.4 cm in diameter. There is a right common iliac artery aneurysm measuring 2.1 cm. There is a left internal iliac artery aneurysm measuring 3.1 cm in diameter. 2. No acute abdominal or pelvic pathology. 3. Diverticulosis without evidence of diverticulitis. 4. Bibasilar atelectasis, right worse than left. 5. Indeterminate 16 mm hypodense left renal mass.   Electronically Signed   By: Elige KoHetal  Patel   On: 07/09/2014 18:18    Scheduled Meds: . sodium chloride   Intravenous Once  . sodium chloride   Intravenous Once  . bacitracin  1 application Topical BID  . bupivacaine (PF)  30 mL Infiltration Once  . feeding supplement (ENSURE COMPLETE)  237 mL Oral BID BM  . folic acid  1 mg Oral Daily  . furosemide  20 mg Oral Daily  . hydrALAZINE  25 mg Oral BID  .  morphine injection  4 mg Intravenous Once  . multivitamin with minerals  1 tablet Oral Daily  . senna  1 tablet Oral BID  . sodium chloride  3 mL Intravenous Q12H  . tamsulosin  0.4 mg Oral BID  . thiamine  100 mg Oral Daily   Continuous Infusions:   Principal Problem:   Bleeding Active Problems:   Balance problems   Chronic systolic heart failure   Pacemaker-BiV [employer]   Atrial fibrillation   Alcohol abuse   CKD (chronic kidney disease) stage 3, GFR 30-59 ml/min   Normocytic anemia   Chronic diastolic heart failure   Protein-calorie malnutrition, severe   Fall    Time spent: >35 minutes     Esperanza SheetsBURIEV, Nollan Muldrow N  [facility] Pager [phone]. If 7PM-7AM, please contact night-coverage at [email or web address], password [facility] 07/10/2014, 11:18 AM  LOS: 2 days

## 2014-07-10 NOTE — Progress Notes (Signed)
*  Preliminary Results* Bilateral lower extremity venous duplex completed. Visualized veins of bilateral lower extremities are negative for deep vein thrombosis. There is no evidence of Baker's cyst bilaterally.  07/10/2014  Gertie Fey, RVT, RDCS, RDMS

## 2014-07-10 NOTE — Progress Notes (Signed)
    Subjective:     Patient reports pain as 3 on 0-10 scale.   Denies CP or SOB.  Voiding without difficulty. Positive flatus. Objective: Vital signs in last 24 hours: Temp:  [97.9 F (36.6 C)-98.7 F (37.1 C)] 97.9 F (36.6 C) (12/24 0539) Pulse Rate:  [72-83] 72 (12/24 1046) Resp:  [16-18] 18 (12/24 0539) BP: (107-160)/(55-79) 121/60 mmHg (12/24 1046) SpO2:  [94 %-100 %] 96 % (12/24 0539)  Intake/Output from previous day: 12/23 0701 - 12/24 0700 In: 2110 [P.O.:960; Blood:1150] Out: 900 [Urine:900] Intake/Output this shift:    Labs:  Recent Labs  07/08/14 1532 07/09/14 0635 07/09/14 1354 07/09/14 2216 07/10/14 0605  HGB 10.1* 7.1* 6.7* 7.2* 7.9*    Recent Labs  07/09/14 0635  07/09/14 2216 07/10/14 0605  WBC 7.2  --  9.0  --   RBC 2.28*  --  2.34*  --   HCT 21.4*  < > 21.4* 23.3*  PLT 115*  --  110*  --   < > = values in this interval not displayed.  Recent Labs  07/08/14 1532  NA 142  K 4.5  CL 107  CO2 26  BUN 33*  CREATININE 1.60*  GLUCOSE 99  CALCIUM 9.0    Recent Labs  07/08/14 1532 07/10/14 0605  INR 2.57* 1.39    Physical Exam: Neurologically intact Intact pulses distally Compartment soft  Assessment/Plan: Under sterile conditions injected 10 cc of Marcaine into right shoulder Attempted CR for improved alignment. Significant stiffness - unable to reduce.  Skin sensitivity also a major factor Per patient this has been subluxed for sometime - got worse after fall on Monday. Will order CT scan for better definition of subluxation Will update Dr Charlann Boxer - most likely chronic condition.   Venita Lick D for Dr. Venita Lick [facility] [phone] 07/10/2014, 3:13 PM

## 2014-07-10 NOTE — Progress Notes (Signed)
Subjective: Comfortable Denies abdominal pain and SOB  Objective: Vital signs in last 24 hours: Temp:  [97.9 F (36.6 C)-98.7 F (37.1 C)] 97.9 F (36.6 C) (12/24 0539) Pulse Rate:  [66-98] 77 (12/24 0539) Resp:  [16-20] 18 (12/24 0539) BP: (103-160)/(51-79) 125/77 mmHg (12/24 0539) SpO2:  [94 %-100 %] 96 % (12/24 0539) Last BM Date: 07/09/14  Intake/Output from previous day: 12/23 0701 - 12/24 0700 In: 2110 [P.O.:960; Blood:1150] Out: 900 [Urine:900] Intake/Output this shift:    Wounds on back stable with no large hematoma Abdomen soft, non tender  Lab Results:   Recent Labs  07/09/14 0635  07/09/14 2216 07/10/14 0605  WBC 7.2  --  9.0  --   HGB 7.1*  < > 7.2* 7.9*  HCT 21.4*  < > 21.4* 23.3*  PLT 115*  --  110*  --   < > = values in this interval not displayed. BMET  Recent Labs  07/08/14 1532  NA 142  K 4.5  CL 107  CO2 26  GLUCOSE 99  BUN 33*  CREATININE 1.60*  CALCIUM 9.0   PT/INR  Recent Labs  07/08/14 1532 07/10/14 0605  LABPROT 27.8* 17.2*  INR 2.57* 1.39   ABG No results for input(s): PHART, HCO3 in the last 72 hours.  Invalid input(s): PCO2, PO2  Studies/Results: Dg Shoulder Right  07/09/2014   CLINICAL DATA:  Right shoulder pain post fall Monday evening  EXAM: RIGHT SHOULDER - 2+ VIEW  COMPARISON:  01/16/2012  FINDINGS: Study is limited by diffuse osteopenia. There is anterior medial subluxation of the humeral head from glenoid. Significant sclerosis noted right glenoid and humeral head. There is spurring of humeral head. There is cortical step-off within inferior aspect of the scapula. This is suspicious for scapular fracture of indeterminate age. Clinical correlation is necessary. Degenerative changes acromioclavicular joint.  IMPRESSION: There is anterior medial subluxation of the humeral head from glenoid. Significant sclerosis noted right glenoid and humeral head. There is spurring of humeral head. There is cortical step-off  within inferior aspect of the scapula. This is suspicious for scapular fracture of indeterminate age. Clinical correlation is necessary. Degenerative changes acromioclavicular joint.   Electronically Signed   By: Natasha MeadLiviu  Pop M.D.   On: 07/09/2014 13:00   Ct Head Wo Contrast  07/08/2014   CLINICAL DATA:  80 year old male with history of trauma from a fall backwards. Patient on Xarelto. Currently unable to straighten is intact.  EXAM: CT HEAD WITHOUT CONTRAST  TECHNIQUE: Contiguous axial images were obtained from the base of the skull through the vertex without intravenous contrast.  COMPARISON:  Head CT 12/17/2012.  FINDINGS: Mild cerebral and cerebellar atrophy. Patchy and confluent areas of decreased attenuation are noted throughout the deep and periventricular white matter of the cerebral hemispheres bilaterally, compatible with chronic microvascular ischemic disease. Well-defined foci of low attenuation in the right basal ganglia, compatible with old lacunar infarcts (unchanged). No acute displaced skull fractures are identified. No acute intracranial abnormality. Specifically, no evidence of acute post-traumatic intracranial hemorrhage, no definite regions of acute/subacute cerebral ischemia, no focal mass, mass effect, hydrocephalus or abnormal intra or extra-axial fluid collections. Right mastoids are well pneumatized. Status post left mastoidectomy. Paranasal sinuses are generally well pneumatized, with exception of some multifocal mucosal thickening in the ethmoids bilaterally, probable inspissated secretions in the right frontoethmoidal recess, and polypoid lesions in the right maxillary sinus which may represent polyps or mucosal retention cysts.  IMPRESSION: 1. No signs of significant acute traumatic injury  to the skull or brain. 2. Mild cerebral and cerebellar atrophy with chronic microvascular ischemic changes in the cerebral white matter and old right basal ganglia lacunar infarcts, similar to the  prior study. 3. Paranasal sinus disease, as above.   Electronically Signed   By: Trudie Reed M.D.   On: 07/08/2014 19:40   Ct Abdomen Pelvis W Contrast  07/09/2014   CLINICAL DATA:  Right upper quadrant pain and tenderness  EXAM: CT ABDOMEN AND PELVIS WITH CONTRAST  TECHNIQUE: Multidetector CT imaging of the abdomen and pelvis was performed using the standard protocol following bolus administration of intravenous contrast.  CONTRAST:  34mL OMNIPAQUE IOHEXOL 300 MG/ML  SOLN  COMPARISON:  None.  FINDINGS: There is right lower lobe airspace disease likely reflecting atelectasis. There is mild left basilar atelectasis. There is multi vessel coronary artery atherosclerosis involving the left main, lad, circumflex and RCA. There is a cardiac pacer noted.  The liver demonstrates no focal abnormality. There is no intrahepatic or extrahepatic biliary ductal dilatation. The gallbladder is normal. The spleen demonstrates no focal abnormality.Bilateral kidneys are atrophic. There is a 16 x 15 mm hypodense left renal interpolar mass which is incompletely characterized on the current exam. The adrenal glands and pancreas are normal. The bladder is unremarkable.  The stomach, duodenum, small intestine, and large intestine demonstrate no contrast extravasation or dilatation. Incidental note is made of a duodenal diverticulum. There is diverticulosis without evidence of diverticulitis. There is no pneumoperitoneum, pneumatosis, or portal venous gas. There is no abdominal or pelvic free fluid. There is no lymphadenopathy.  There is an infrarenal abdominal aortic aneurysm measuring 3 cm in AP diameter. There is a right common iliac artery aneurysm measuring 2.4 cm in diameter. There is a right common iliac artery aneurysm measuring 2.1 cm. There is a left internal iliac artery aneurysm measuring 3.1 cm in diameter.  There are no lytic or sclerotic osseous lesions. There is a right total hip arthroplasty noted with beam  hardening artifact which partially obscures the adjacent soft tissue and osseous structures. There is severe degenerative disc disease and facet arthropathy throughout the thoracolumbar spine. There are severe arthritic changes of bilateral wrists.  IMPRESSION: 1. There is an infrarenal abdominal aortic aneurysm measuring 3 cm in AP diameter. There is a right common iliac artery aneurysm measuring 2.4 cm in diameter. There is a right common iliac artery aneurysm measuring 2.1 cm. There is a left internal iliac artery aneurysm measuring 3.1 cm in diameter. 2. No acute abdominal or pelvic pathology. 3. Diverticulosis without evidence of diverticulitis. 4. Bibasilar atelectasis, right worse than left. 5. Indeterminate 16 mm hypodense left renal mass.   Electronically Signed   By: Elige Ko   On: 07/09/2014 18:18    Anti-infectives: Anti-infectives    None      Assessment/Plan: Pt s/p fall with superficial skin tearing  Continue wound care H/H stable  LOS: 2 days    Lanyla Costello A 07/10/2014

## 2014-07-11 LAB — CBC
HCT: 22.7 % — ABNORMAL LOW (ref 39.0–52.0)
Hemoglobin: 7.6 g/dL — ABNORMAL LOW (ref 13.0–17.0)
MCH: 29.2 pg (ref 26.0–34.0)
MCHC: 33.5 g/dL (ref 30.0–36.0)
MCV: 87.3 fL (ref 78.0–100.0)
Platelets: 97 10*3/uL — ABNORMAL LOW (ref 150–400)
RBC: 2.6 MIL/uL — AB (ref 4.22–5.81)
RDW: 19.4 % — ABNORMAL HIGH (ref 11.5–15.5)
WBC: 8.8 10*3/uL (ref 4.0–10.5)

## 2014-07-11 LAB — PREPARE RBC (CROSSMATCH)

## 2014-07-11 MED ORDER — SODIUM CHLORIDE 0.9 % IV SOLN
Freq: Once | INTRAVENOUS | Status: AC
  Administered 2014-07-11: 15:00:00 via INTRAVENOUS

## 2014-07-11 NOTE — Progress Notes (Signed)
Orthopedic Tech Progress Note Patient Details:  Phillip Kelley [facility] 10/09/1926 [id number] Applied. Tolerated well. Ortho Devices Type of Ortho Device: Arm sling Ortho Device/Splint Location: RUE Ortho Device/Splint Interventions: Application   Phillip Kelley 07/11/2014, 11:00 AM

## 2014-07-11 NOTE — Progress Notes (Addendum)
Subjective: Sleepy but arousable. Complains of right shoulder pain.   no active bleeding   no shortness of breath. Denies abdominal pain. Appreciate evaluation of right shoulder by orthopedics.  Lower extremity venous duplex study negative for DVT.   Objective: Vital signs in last 24 hours: Temp:  [98.2 F (36.8 C)-99.1 F (37.3 C)] 98.2 F (36.8 C) (12/25 0507) Pulse Rate:  [72-78] 76 (12/25 0507) Resp:  [17-18] 17 (12/25 0507) BP: (118-168)/(60-92) 140/60 mmHg (12/25 0507) SpO2:  [95 %-98 %] 95 % (12/25 0507) Last BM Date: 07/10/14  Intake/Output from previous day: 12/24 0701 - 12/25 0700 In: 780 [P.O.:780] Out: 750 [Urine:750] Intake/Output this shift:    General appearance: Elderly. Deconditioned. Cooperative but quite feeble. Answers questions appropriately. Resp: clear to auscultation bilaterally Chest wall: no tenderness, Pacemaker left infraclavicular area GI: soft, non-tender; bowel sounds normal; no masses,  no organomegaly Skin: Large bandage on back taken down. Stable. No active bleeding. Nursing to redress now  Lab Results:   Recent Labs  07/09/14 0635  07/09/14 2216  07/10/14 1520 07/10/14 2206  WBC 7.2  --  9.0  --   --   --   HGB 7.1*  < > 7.2*  < > 8.3* 8.0*  HCT 21.4*  < > 21.4*  < > 24.7* 23.8*  PLT 115*  --  110*  --   --   --   < > = values in this interval not displayed. BMET  Recent Labs  07/08/14 1532  NA 142  K 4.5  CL 107  CO2 26  GLUCOSE 99  BUN 33*  CREATININE 1.60*  CALCIUM 9.0   PT/INR  Recent Labs  07/08/14 1532 07/10/14 0605  LABPROT 27.8* 17.2*  INR 2.57* 1.39   ABG No results for input(s): PHART, HCO3 in the last 72 hours.  Invalid input(s): PCO2, PO2  Studies/Results: Dg Shoulder Right  07/09/2014   CLINICAL DATA:  Right shoulder pain post fall Monday evening  EXAM: RIGHT SHOULDER - 2+ VIEW  COMPARISON:  01/16/2012  FINDINGS: Study is limited by diffuse osteopenia. There is anterior medial subluxation  of the humeral head from glenoid. Significant sclerosis noted right glenoid and humeral head. There is spurring of humeral head. There is cortical step-off within inferior aspect of the scapula. This is suspicious for scapular fracture of indeterminate age. Clinical correlation is necessary. Degenerative changes acromioclavicular joint.  IMPRESSION: There is anterior medial subluxation of the humeral head from glenoid. Significant sclerosis noted right glenoid and humeral head. There is spurring of humeral head. There is cortical step-off within inferior aspect of the scapula. This is suspicious for scapular fracture of indeterminate age. Clinical correlation is necessary. Degenerative changes acromioclavicular joint.   Electronically Signed   By: Natasha MeadLiviu  Pop M.D.   On: 07/09/2014 13:00   Ct Abdomen Pelvis W Contrast  07/09/2014   CLINICAL DATA:  Right upper quadrant pain and tenderness  EXAM: CT ABDOMEN AND PELVIS WITH CONTRAST  TECHNIQUE: Multidetector CT imaging of the abdomen and pelvis was performed using the standard protocol following bolus administration of intravenous contrast.  CONTRAST:  80mL OMNIPAQUE IOHEXOL 300 MG/ML  SOLN  COMPARISON:  None.  FINDINGS: There is right lower lobe airspace disease likely reflecting atelectasis. There is mild left basilar atelectasis. There is multi vessel coronary artery atherosclerosis involving the left main, lad, circumflex and RCA. There is a cardiac pacer noted.  The liver demonstrates no focal abnormality. There is no intrahepatic or extrahepatic biliary ductal dilatation.  The gallbladder is normal. The spleen demonstrates no focal abnormality.Bilateral kidneys are atrophic. There is a 16 x 15 mm hypodense left renal interpolar mass which is incompletely characterized on the current exam. The adrenal glands and pancreas are normal. The bladder is unremarkable.  The stomach, duodenum, small intestine, and large intestine demonstrate no contrast extravasation or  dilatation. Incidental note is made of a duodenal diverticulum. There is diverticulosis without evidence of diverticulitis. There is no pneumoperitoneum, pneumatosis, or portal venous gas. There is no abdominal or pelvic free fluid. There is no lymphadenopathy.  There is an infrarenal abdominal aortic aneurysm measuring 3 cm in AP diameter. There is a right common iliac artery aneurysm measuring 2.4 cm in diameter. There is a right common iliac artery aneurysm measuring 2.1 cm. There is a left internal iliac artery aneurysm measuring 3.1 cm in diameter.  There are no lytic or sclerotic osseous lesions. There is a right total hip arthroplasty noted with beam hardening artifact which partially obscures the adjacent soft tissue and osseous structures. There is severe degenerative disc disease and facet arthropathy throughout the thoracolumbar spine. There are severe arthritic changes of bilateral wrists.  IMPRESSION: 1. There is an infrarenal abdominal aortic aneurysm measuring 3 cm in AP diameter. There is a right common iliac artery aneurysm measuring 2.4 cm in diameter. There is a right common iliac artery aneurysm measuring 2.1 cm. There is a left internal iliac artery aneurysm measuring 3.1 cm in diameter. 2. No acute abdominal or pelvic pathology. 3. Diverticulosis without evidence of diverticulitis. 4. Bibasilar atelectasis, right worse than left. 5. Indeterminate 16 mm hypodense left renal mass.   Electronically Signed   By: Elige Ko   On: 07/09/2014 18:18    Anti-infectives: Anti-infectives    None      Assessment/Plan:   Status post fall with extensive superficial skin tearing of back. Hemoglobin stable. No active bleeding. Continue wound care.  Right shoulder pain. Dislocation versus chronic arthropathy.. Under evaluation by orthopedics.  Atrial fibrillation. Holding xarelto. . significant fall risk in future. Alternative management of DVT and PE risk should be considered, including  filter. Vs. reversible agent.    LOS: 3 days    Phillip Kelley M 07/11/2014

## 2014-07-11 NOTE — Progress Notes (Signed)
[facility] PROGRESS NOTE  Phillip Kelley WGN:[id number] DOB: 05-16-1927 DOA: 07/08/2014 PCP: [name], MD  Assessment/Plan: 80 y/o male with PMH of HTN, A fib, s/p PPM, CHF, CKD,  h/o DVT on xarelto, Gait ataxia, frequent falls had another episode of fall -admitted with severe large skin tear, dislocated shoulder, blood loss anemia   1. Fall, mechanical, frequent falls, gait ataxia-chronic; CT head, abd: no acute fractures  -large skin tear, profuse bleeding on xarelto; Continue non-adherent dressing, then transition to Mepilex once oozing stops. Continue antibiotic ointment twice a day   -PT: HH PT/OT upon d/c  2. R shoulder severe arthropathy; per ortho: cont sling, f/u with Dr. Rennis Chris as outpatient  -appreciate trauma, ortho input   3. H/o DVT; Leg [place]: no DVT;   -patient is not a good candidate for anticoagulation due to continuous fall risk, bleeding   3. A fib, s/p PPM; HR stable; will hold xarelto;   -d/w patient, he reports frequent falls at home, uses walker; patient is not a good candidate for anticoagulation; will hold xarelto; will start ASA when bleeding stops; outpatient cardiology follow up reevaluation if gait will improve  4. Acute blood loss anemia on xarelto; s/p 2 units FFP;  Tfsed 1 units PRBCs; Tf 1 unit 12/25; monitor Tf prn   5. AAA ON ct, outpatient f/u with abd [place] 6. Chronic CHF-diastolic HF, echo (2014): 65-70%; clinically euvolemic; cont home regimen   7. Alcoholism, denies h/o withdrawals; no s/s of withdrawal on exam; cont monitor on CIWA;    Code Status: full Family Communication: d/w patient,  (indicate person spoken with, relationship, and if by phone, the number) Disposition Plan: home 24-48 hrs HHC   Consultants:  Trauma, ortho   Procedures:  None   Antibiotics:  none (indicate start date, and stop date if known)  HPI/Subjective: alert  Objective: Filed Vitals:   07/11/14 0507  BP: 140/60  Pulse: 76  Temp: 98.2 F  (36.8 C)  Resp: 17    Intake/Output Summary (Last 24 hours) at 07/11/14 1037 Last data filed at 07/10/14 1840  Gross per 24 hour  Intake    540 ml  Output    750 ml  Net   -210 ml   Filed Weights   07/08/14 2212 07/11/14 0605  Weight: 69.7 kg (153 lb 10.6 oz) 72.3 kg (159 lb 6.3 oz)    Exam:   General:  alert  Cardiovascular: s1,s2 rrr  Respiratory: CTA BL  Abdomen: soft, nt,nd   Musculoskeletal: no no LE edema   Data Reviewed: Basic Metabolic Panel:  Recent Labs Lab 07/08/14 1532  NA 142  K 4.5  CL 107  CO2 26  GLUCOSE 99  BUN 33*  CREATININE 1.60*  CALCIUM 9.0   Liver Function Tests: No results for input(s): AST, ALT, ALKPHOS, BILITOT, PROT, ALBUMIN in the last 168 hours. No results for input(s): LIPASE, AMYLASE in the last 168 hours. No results for input(s): AMMONIA in the last 168 hours. CBC:  Recent Labs Lab 07/08/14 1532 07/09/14 0635  07/09/14 2216 07/10/14 0605 07/10/14 1520 07/10/14 2206 07/11/14 0720  WBC 9.5 7.2  --  9.0  --   --   --  8.8  NEUTROABS 6.8  --   --   --   --   --   --   --   HGB 10.1* 7.1*  < > 7.2* 7.9* 8.3* 8.0* 7.6*  HCT 30.6* 21.4*  < > 21.4* 23.3* 24.7* 23.8* 22.7*  MCV 92.7 93.9  --  91.5  --   --   --  87.3  PLT 135* 115*  --  110*  --   --   --  97*  < > = values in this interval not displayed. Cardiac Enzymes: No results for input(s): CKTOTAL, CKMB, CKMBINDEX, TROPONINI in the last 168 hours. BNP (last 3 results) No results for input(s): PROBNP in the last 8760 hours. CBG: No results for input(s): GLUCAP in the last 168 hours.  No results found for this or any previous visit (from the past 240 hour(s)).   Studies: Dg Shoulder Right  07/09/2014   CLINICAL DATA:  Right shoulder pain post fall Monday evening  EXAM: RIGHT SHOULDER - 2+ VIEW  COMPARISON:  01/16/2012  FINDINGS: Study is limited by diffuse osteopenia. There is anterior medial subluxation of the humeral head from glenoid. Significant sclerosis  noted right glenoid and humeral head. There is spurring of humeral head. There is cortical step-off within inferior aspect of the scapula. This is suspicious for scapular fracture of indeterminate age. Clinical correlation is necessary. Degenerative changes acromioclavicular joint.  IMPRESSION: There is anterior medial subluxation of the humeral head from glenoid. Significant sclerosis noted right glenoid and humeral head. There is spurring of humeral head. There is cortical step-off within inferior aspect of the scapula. This is suspicious for scapular fracture of indeterminate age. Clinical correlation is necessary. Degenerative changes acromioclavicular joint.   Electronically Signed   By: Natasha MeadLiviu  Pop M.D.   On: 07/09/2014 13:00   Ct Abdomen Pelvis W Contrast  07/09/2014   CLINICAL DATA:  Right upper quadrant pain and tenderness  EXAM: CT ABDOMEN AND PELVIS WITH CONTRAST  TECHNIQUE: Multidetector CT imaging of the abdomen and pelvis was performed using the standard protocol following bolus administration of intravenous contrast.  CONTRAST:  80mL OMNIPAQUE IOHEXOL 300 MG/ML  SOLN  COMPARISON:  None.  FINDINGS: There is right lower lobe airspace disease likely reflecting atelectasis. There is mild left basilar atelectasis. There is multi vessel coronary artery atherosclerosis involving the left main, lad, circumflex and RCA. There is a cardiac pacer noted.  The liver demonstrates no focal abnormality. There is no intrahepatic or extrahepatic biliary ductal dilatation. The gallbladder is normal. The spleen demonstrates no focal abnormality.Bilateral kidneys are atrophic. There is a 16 x 15 mm hypodense left renal interpolar mass which is incompletely characterized on the current exam. The adrenal glands and pancreas are normal. The bladder is unremarkable.  The stomach, duodenum, small intestine, and large intestine demonstrate no contrast extravasation or dilatation. Incidental note is made of a duodenal  diverticulum. There is diverticulosis without evidence of diverticulitis. There is no pneumoperitoneum, pneumatosis, or portal venous gas. There is no abdominal or pelvic free fluid. There is no lymphadenopathy.  There is an infrarenal abdominal aortic aneurysm measuring 3 cm in AP diameter. There is a right common iliac artery aneurysm measuring 2.4 cm in diameter. There is a right common iliac artery aneurysm measuring 2.1 cm. There is a left internal iliac artery aneurysm measuring 3.1 cm in diameter.  There are no lytic or sclerotic osseous lesions. There is a right total hip arthroplasty noted with beam hardening artifact which partially obscures the adjacent soft tissue and osseous structures. There is severe degenerative disc disease and facet arthropathy throughout the thoracolumbar spine. There are severe arthritic changes of bilateral wrists.  IMPRESSION: 1. There is an infrarenal abdominal aortic aneurysm measuring 3 cm in AP diameter. There is a right  common iliac artery aneurysm measuring 2.4 cm in diameter. There is a right common iliac artery aneurysm measuring 2.1 cm. There is a left internal iliac artery aneurysm measuring 3.1 cm in diameter. 2. No acute abdominal or pelvic pathology. 3. Diverticulosis without evidence of diverticulitis. 4. Bibasilar atelectasis, right worse than left. 5. Indeterminate 16 mm hypodense left renal mass.   Electronically Signed   By: Elige Ko   On: 07/09/2014 18:18   Ct Shoulder Right Wo Contrast  07/11/2014   CLINICAL DATA:  Fall on 07/07/2014 with resulting pain in the right shoulder.  EXAM: CT OF THE RIGHT SHOULDER WITHOUT CONTRAST  TECHNIQUE: Multidetector CT imaging was performed according to the standard protocol. Multiplanar CT image reconstructions were also generated.  COMPARISON:  Multiple exams, including 07/09/2014 and 01/26/2012  FINDINGS: Deformity of the scapula with evidence of prior fractures, with abnormal callus formation and some nonunited  fragments. Atrophic rotator cuff musculature.  Expansion, flattening ski, sclerosis, and irregularity of the glenoid, with the bony glenoid essentially continuous with a thick irregular rim of calcification outlining the shoulder joint and continuous with the subacromial subdeltoid bursa. Mixed density material is present in this component and bursa/joint indicating prominent synovitis with scattered amorphous calcification. The result is a shell like pattern of ossification along the conjoined glenohumeral joint and subacromial subdeltoid bursa, as shown on image 48 of series 209 and as shown on three-dimensional reconstructed images.  There is thinning of the distal acromion which may be from acromioplasty or erosion. Spurring at the [facility] joint observed. Severe degenerative findings in the humeral head with subcortical cyst formation, cortical irregularity, and spurring.  Right pleural effusion. Prominent lower cervical and upper thoracic spondylosis. Ankylosing spondylitis is not excluded.  There is a small amount of gas in the subacromial subdeltoid bursa on the right, images 14-20 of series 202.  IMPRESSION: 1. Markedly severe glenohumeral arthropathy with complete rotator cuff rupture and extensive dystrophic calcifications/ossification along the joint synovial surfaces and continuity with the synovial surfaces of the subacromial subdeltoid bursa, leading to a shell like rim of bone which encases the humeral head. Associated erosion, expansion, and irregularity of the glenoid. Prominent synovitis in the subacromial subdeltoid bursa, along with a small amount of gas in the bursa on images 15-19 of series 202. Differential diagnostic considerations for the gas include recent aspiration (if any); infection; or nitrogen gas phenomenon. 2. The humeral head is not dislocated or subluxed. 3. Old scapular fractures on the left with resulting deformity in fragments. 4. Severe spondylosis. 5. Small right pleural effusion.    Electronically Signed   By: Herbie Baltimore M.D.   On: 07/11/2014 09:54    Scheduled Meds: . sodium chloride   Intravenous Once  . sodium chloride   Intravenous Once  . bacitracin  1 application Topical BID  . bupivacaine (PF)  30 mL Infiltration Once  . feeding supplement (ENSURE COMPLETE)  237 mL Oral BID BM  . folic acid  1 mg Oral Daily  . furosemide  20 mg Oral Daily  . hydrALAZINE  25 mg Oral BID  .  morphine injection  4 mg Intravenous Once  . multivitamin with minerals  1 tablet Oral Daily  . senna  1 tablet Oral BID  . sodium chloride  3 mL Intravenous Q12H  . tamsulosin  0.4 mg Oral BID  . thiamine  100 mg Oral Daily   Continuous Infusions:   Principal Problem:   Bleeding Active Problems:   Balance problems  Chronic systolic heart failure   Pacemaker-BiV [employer]   Atrial fibrillation   Alcohol abuse   CKD (chronic kidney disease) stage 3, GFR 30-59 ml/min   Normocytic anemia   Chronic diastolic heart failure   Protein-calorie malnutrition, severe   Fall    Time spent: >35 minutes     Esperanza Sheets  [facility] Pager [phone]. If 7PM-7AM, please contact night-coverage at [email or web address], password [facility] 07/11/2014, 10:37 AM  LOS: 3 days

## 2014-07-11 NOTE — Progress Notes (Addendum)
Subjective: Pt c/o right shoulder pain that's controlled reasonably well with oral pain meds.  Attempt at closed reduction by Dr. Shon Baton yesterday without significant change.  CT done since then.  No n/v/f/c.   Objective: Vital signs in last 24 hours: Temp:  [98.2 F (36.8 C)-99.1 F (37.3 C)] 98.2 F (36.8 C) (12/25 0507) Pulse Rate:  [72-78] 76 (12/25 0507) Resp:  [17-18] 17 (12/25 0507) BP: (118-168)/(60-92) 140/60 mmHg (12/25 0507) SpO2:  [95 %-98 %] 95 % (12/25 0507) Weight:  [72.3 kg (159 lb 6.3 oz)] 72.3 kg (159 lb 6.3 oz) (12/25 0605)  Intake/Output from previous day: 12/24 0701 - 12/25 0700 In: 780 [P.O.:780] Out: 750 [Urine:750] Intake/Output this shift:     Recent Labs  07/09/14 2216 07/10/14 0605 07/10/14 1520 07/10/14 2206 07/11/14 0720  HGB 7.2* 7.9* 8.3* 8.0* 7.6*    Recent Labs  07/09/14 2216  07/10/14 2206 07/11/14 0720  WBC 9.0  --   --  8.8  RBC 2.34*  --   --  2.60*  HCT 21.4*  < > 23.8* 22.7*  PLT 110*  --   --  97*  < > = values in this interval not displayed.  Recent Labs  07/08/14 1532  NA 142  K 4.5  CL 107  CO2 26  BUN 33*  CREATININE 1.60*  GLUCOSE 99  CALCIUM 9.0    Recent Labs  07/08/14 1532 07/10/14 0605  INR 2.57* 1.39    PE:  elderly male in nad.  Alert and oriented x 4.  R shoulder with mild swelling and superior prominence.  TTP diffusely about the shoulder.  NVI at R UE.  My review of the CT scan reveals no acute fracture.  The glenohumeral joint shows severe cuff tear arthropathy with extensive calcification about the [facility] joint.  Assessment/Plan: R shoulder severe rotator cuff arthropathy.  Based on his exam and CT findings, I believe he has exacerbated his underlying arthropathy with his recent fall.  He can wear a sling for comfort and use oral pain meds as needed.  He should f/u with Dr. Rennis Chris in a few weeks.  He can be discharged at any time from the ortho perspective.  He understands this plan and  agrees.   Toni Arthurs 07/11/2014, 9:12 AM     CT report shows no sign of dislocation or other acute injury.  Only severe degenerative changes and calcification about the humeral head.  Ortho signing off.  Please call with any questions.  [phone].

## 2014-07-12 DIAGNOSIS — R29818 Other symptoms and signs involving the nervous system: Secondary | ICD-10-CM

## 2014-07-12 LAB — TYPE AND SCREEN
ABO/RH(D): A POS
Antibody Screen: NEGATIVE
UNIT DIVISION: 0
Unit division: 0
Unit division: 0

## 2014-07-12 LAB — CBC
HEMATOCRIT: 25.8 % — AB (ref 39.0–52.0)
Hemoglobin: 8.9 g/dL — ABNORMAL LOW (ref 13.0–17.0)
MCH: 30.5 pg (ref 26.0–34.0)
MCHC: 34.5 g/dL (ref 30.0–36.0)
MCV: 88.4 fL (ref 78.0–100.0)
Platelets: 123 10*3/uL — ABNORMAL LOW (ref 150–400)
RBC: 2.92 MIL/uL — ABNORMAL LOW (ref 4.22–5.81)
RDW: 18.6 % — ABNORMAL HIGH (ref 11.5–15.5)
WBC: 11.1 10*3/uL — AB (ref 4.0–10.5)

## 2014-07-12 MED ORDER — OXYCODONE HCL 5 MG PO TABS: 5.0000 mg | ORAL_TABLET | ORAL | Status: DC | PRN

## 2014-07-12 MED ORDER — BACITRACIN ZINC 500 UNIT/GM EX OINT
1.0000 | TOPICAL_OINTMENT | Freq: Two times a day (BID) | CUTANEOUS | Status: DC
Start: 2014-07-12 — End: 2014-08-19

## 2014-07-12 MED ORDER — BACITRACIN 500 UNIT/GM EX OINT: 1.0000 "application " | TOPICAL_OINTMENT | Freq: Two times a day (BID) | CUTANEOUS | Status: DC

## 2014-07-12 NOTE — Discharge Summary (Signed)
Physician Discharge Summary  ELLIOTT LASECKI NWG:[id number] DOB: [birth year] DOA: 07/08/2014  PCP: [name], MD  Admit date: 07/08/2014 Discharge date: 07/12/2014  Time spent: >35 minutes  Recommendations for Outpatient Follow-up:  HHC; PT/OT; wound care, nurse  F/u with cardiology 3-5 days F/u with orthopedics 1 week   Discharge Diagnoses:  Principal Problem:   Bleeding Active Problems:   Balance problems   Chronic systolic heart failure   Pacemaker-BiV [employer]   Atrial fibrillation   Alcohol abuse   CKD (chronic kidney disease) stage 3, GFR 30-59 ml/min   Normocytic anemia   Chronic diastolic heart failure   Protein-calorie malnutrition, severe   Fall   Discharge Condition: stable   Diet recommendation: low sodium   Filed Weights   07/08/14 2212 07/11/14 0605  Weight: 69.7 kg (153 lb 10.6 oz) 72.3 kg (159 lb 6.3 oz)    History of present illness:  80 y/o male with PMH of HTN, A fib, s/p PPM, CHF, CKD, h/o DVT on xarelto, Gait ataxia, frequent falls had another episode of fall -admitted with severe large skin tear, dislocated shoulder, blood loss anemia   Hospital Course:  1. Fall, mechanical, frequent falls, gait ataxia-chronic; CT head, abd: no acute fractures  -large skin tear, profuse bleeding on xarelto; Continue non-adherent dressing, transition to Mepilex. Continue antibiotic ointment twice a day  -HHC PT/OT, nurse with wound care eval at home  2. R shoulder severe arthropathy; per ortho: cont sling, f/u with Dr. Rennis Chris as outpatient  -appreciate trauma, ortho input   3. H/o DVT; Leg [place]: no DVT;  -patient is not a good candidate for anticoagulation due to continuous fall risk, bleeding  3. A fib, s/p PPM; HR stable; will hold xarelto;  -d/w patient, he reports frequent falls at home, uses walker; patient is not a good candidate for anticoagulation; hold xarelto; will start ASA when bleeding stops in 1 week;  -outpatient cardiology follow  up reevaluation for anticoagulation if gait will improve  4. Acute blood loss anemia on xarelto; s/p 2 units FFP; Tfsed 1 units PRBCs; Tf 1 unit 12/25; Hg is stable   5. AAA ON ct, outpatient f/u with abd [place] 6. Chronic CHF-diastolic HF, echo (2014): 65-70%; clinically euvolemic; cont home regimen  7. Alcoholism, denies h/o withdrawals; no s/s of withdrawal on exam; counseled to stop using alcohol    Procedures:  none (i.e. Studies not automatically included, echos, thoracentesis, etc; not x-rays)  Consultations:  Trauma surgery, ortho  Discharge Exam: Filed Vitals:   07/12/14 0521  BP: 155/65  Pulse: 70  Temp: 98.1 F (36.7 C)  Resp: 18    General: alert, oriented  Cardiovascular: s1,s2 rrr Respiratory: CTA BL  Discharge Instructions  Discharge Instructions    Diet - low sodium heart healthy    Complete by:  As directed      Discharge instructions    Complete by:  As directed   Please follow up with cardiologist in 3-5 days Please follow up with orthopedics in 1 week     Increase activity slowly    Complete by:  As directed             Medication List    STOP taking these medications        Rivaroxaban 15 MG Tabs tablet  Commonly known as:  XARELTO      TAKE these medications        acetaminophen 500 MG tablet  Commonly known as:  TYLENOL  Take  500 mg by mouth every 6 (six) hours as needed.     bacitracin ointment  Apply 1 application topically 2 (two) times daily.     calcium-vitamin D 500-200 MG-UNIT per tablet  Take 1 tablet by mouth daily.     furosemide 20 MG tablet  Commonly known as:  LASIX  Take 20 mg by mouth daily.     hydrALAZINE 25 MG tablet  Commonly known as:  APRESOLINE  Take 25 mg by mouth 2 (two) times daily.     multivitamin tablet  Take 1 tablet by mouth daily.     mupirocin ointment 2 %  Commonly known as:  BACTROBAN     oxyCODONE 5 MG immediate release tablet  Commonly known as:  Oxy IR/ROXICODONE  Take 1 tablet  (5 mg total) by mouth every 4 (four) hours as needed for moderate pain.     tamsulosin 0.4 MG Caps capsule  Commonly known as:  FLOMAX  Take 0.4 mg by mouth 2 (two) times daily.       Allergies  Allergen Reactions  . Penicillins Rash  . Sulfa Antibiotics Rash  . Losartan     Hyperkalemia        Follow-up Information    Follow up with Senaida Lange, MD. Schedule an appointment as soon as possible for a visit in 2 weeks.   Specialty:  Orthopedic Surgery   Why:  As needed   Contact information:   [address] [place] [address] [phone]       Follow up with [name], MD In 1 week.   Specialty:  Family Medicine   Contact information:   [address] [place] [address] [phone]       Follow up with Lewayne Bunting, MD. Schedule an appointment as soon as possible for a visit in 3 days.   Specialty:  Cardiology   Contact information:   [address] [place] [address] [phone]        The results of significant diagnostics from this hospitalization (including imaging, microbiology, ancillary and laboratory) are listed below for reference.    Significant Diagnostic Studies: Dg Shoulder Right  07/09/2014   CLINICAL DATA:  Right shoulder pain post fall Monday evening  EXAM: RIGHT SHOULDER - 2+ VIEW  COMPARISON:  01/16/2012  FINDINGS: Study is limited by diffuse osteopenia. There is anterior medial subluxation of the humeral head from glenoid. Significant sclerosis noted right glenoid and humeral head. There is spurring of humeral head. There is cortical step-off within inferior aspect of the scapula. This is suspicious for scapular fracture of indeterminate age. Clinical correlation is necessary. Degenerative changes acromioclavicular joint.  IMPRESSION: There is anterior medial subluxation of the humeral head from glenoid. Significant sclerosis noted right glenoid and humeral head. There is spurring of  humeral head. There is cortical step-off within inferior aspect of the scapula. This is suspicious for scapular fracture of indeterminate age. Clinical correlation is necessary. Degenerative changes acromioclavicular joint.   Electronically Signed   By: Natasha Mead M.D.   On: 07/09/2014 13:00   Ct Head Wo Contrast  07/08/2014   CLINICAL DATA:  80 year old male with history of trauma from a fall backwards. Patient on Xarelto. Currently unable to straighten is intact.  EXAM: CT HEAD WITHOUT CONTRAST  TECHNIQUE: Contiguous axial images were obtained from the base of the skull through the vertex without intravenous contrast.  COMPARISON:  Head CT 12/17/2012.  FINDINGS: Mild cerebral and  cerebellar atrophy. Patchy and confluent areas of decreased attenuation are noted throughout the deep and periventricular white matter of the cerebral hemispheres bilaterally, compatible with chronic microvascular ischemic disease. Well-defined foci of low attenuation in the right basal ganglia, compatible with old lacunar infarcts (unchanged). No acute displaced skull fractures are identified. No acute intracranial abnormality. Specifically, no evidence of acute post-traumatic intracranial hemorrhage, no definite regions of acute/subacute cerebral ischemia, no focal mass, mass effect, hydrocephalus or abnormal intra or extra-axial fluid collections. Right mastoids are well pneumatized. Status post left mastoidectomy. Paranasal sinuses are generally well pneumatized, with exception of some multifocal mucosal thickening in the ethmoids bilaterally, probable inspissated secretions in the right frontoethmoidal recess, and polypoid lesions in the right maxillary sinus which may represent polyps or mucosal retention cysts.  IMPRESSION: 1. No signs of significant acute traumatic injury to the skull or brain. 2. Mild cerebral and cerebellar atrophy with chronic microvascular ischemic changes in the cerebral white matter and old right basal  ganglia lacunar infarcts, similar to the prior study. 3. Paranasal sinus disease, as above.   Electronically Signed   By: Trudie Reedaniel  Entrikin M.D.   On: 07/08/2014 19:40   Ct Abdomen Pelvis W Contrast  07/09/2014   CLINICAL DATA:  Right upper quadrant pain and tenderness  EXAM: CT ABDOMEN AND PELVIS WITH CONTRAST  TECHNIQUE: Multidetector CT imaging of the abdomen and pelvis was performed using the standard protocol following bolus administration of intravenous contrast.  CONTRAST:  80mL OMNIPAQUE IOHEXOL 300 MG/ML  SOLN  COMPARISON:  None.  FINDINGS: There is right lower lobe airspace disease likely reflecting atelectasis. There is mild left basilar atelectasis. There is multi vessel coronary artery atherosclerosis involving the left main, lad, circumflex and RCA. There is a cardiac pacer noted.  The liver demonstrates no focal abnormality. There is no intrahepatic or extrahepatic biliary ductal dilatation. The gallbladder is normal. The spleen demonstrates no focal abnormality.Bilateral kidneys are atrophic. There is a 16 x 15 mm hypodense left renal interpolar mass which is incompletely characterized on the current exam. The adrenal glands and pancreas are normal. The bladder is unremarkable.  The stomach, duodenum, small intestine, and large intestine demonstrate no contrast extravasation or dilatation. Incidental note is made of a duodenal diverticulum. There is diverticulosis without evidence of diverticulitis. There is no pneumoperitoneum, pneumatosis, or portal venous gas. There is no abdominal or pelvic free fluid. There is no lymphadenopathy.  There is an infrarenal abdominal aortic aneurysm measuring 3 cm in AP diameter. There is a right common iliac artery aneurysm measuring 2.4 cm in diameter. There is a right common iliac artery aneurysm measuring 2.1 cm. There is a left internal iliac artery aneurysm measuring 3.1 cm in diameter.  There are no lytic or sclerotic osseous lesions. There is a right  total hip arthroplasty noted with beam hardening artifact which partially obscures the adjacent soft tissue and osseous structures. There is severe degenerative disc disease and facet arthropathy throughout the thoracolumbar spine. There are severe arthritic changes of bilateral wrists.  IMPRESSION: 1. There is an infrarenal abdominal aortic aneurysm measuring 3 cm in AP diameter. There is a right common iliac artery aneurysm measuring 2.4 cm in diameter. There is a right common iliac artery aneurysm measuring 2.1 cm. There is a left internal iliac artery aneurysm measuring 3.1 cm in diameter. 2. No acute abdominal or pelvic pathology. 3. Diverticulosis without evidence of diverticulitis. 4. Bibasilar atelectasis, right worse than left. 5. Indeterminate 16 mm hypodense left renal mass.  Electronically Signed   By: Elige Ko   On: 07/09/2014 18:18   Ct Shoulder Right Wo Contrast  07/11/2014   CLINICAL DATA:  Fall on 07/07/2014 with resulting pain in the right shoulder.  EXAM: CT OF THE RIGHT SHOULDER WITHOUT CONTRAST  TECHNIQUE: Multidetector CT imaging was performed according to the standard protocol. Multiplanar CT image reconstructions were also generated.  COMPARISON:  Multiple exams, including 07/09/2014 and 01/26/2012  FINDINGS: Deformity of the scapula with evidence of prior fractures, with abnormal callus formation and some nonunited fragments. Atrophic rotator cuff musculature.  Expansion, flattening ski, sclerosis, and irregularity of the glenoid, with the bony glenoid essentially continuous with a thick irregular rim of calcification outlining the shoulder joint and continuous with the subacromial subdeltoid bursa. Mixed density material is present in this component and bursa/joint indicating prominent synovitis with scattered amorphous calcification. The result is a shell like pattern of ossification along the conjoined glenohumeral joint and subacromial subdeltoid bursa, as shown on image 48 of  series 209 and as shown on three-dimensional reconstructed images.  There is thinning of the distal acromion which may be from acromioplasty or erosion. Spurring at the [facility] joint observed. Severe degenerative findings in the humeral head with subcortical cyst formation, cortical irregularity, and spurring.  Right pleural effusion. Prominent lower cervical and upper thoracic spondylosis. Ankylosing spondylitis is not excluded.  There is a small amount of gas in the subacromial subdeltoid bursa on the right, images 14-20 of series 202.  IMPRESSION: 1. Markedly severe glenohumeral arthropathy with complete rotator cuff rupture and extensive dystrophic calcifications/ossification along the joint synovial surfaces and continuity with the synovial surfaces of the subacromial subdeltoid bursa, leading to a shell like rim of bone which encases the humeral head. Associated erosion, expansion, and irregularity of the glenoid. Prominent synovitis in the subacromial subdeltoid bursa, along with a small amount of gas in the bursa on images 15-19 of series 202. Differential diagnostic considerations for the gas include recent aspiration (if any); infection; or nitrogen gas phenomenon. 2. The humeral head is not dislocated or subluxed. 3. Old scapular fractures on the left with resulting deformity in fragments. 4. Severe spondylosis. 5. Small right pleural effusion.   Electronically Signed   By: Herbie Baltimore M.D.   On: 07/11/2014 09:54    Microbiology: No results found for this or any previous visit (from the past 240 hour(s)).   Labs: Basic Metabolic Panel:  Recent Labs Lab 07/08/14 1532  NA 142  K 4.5  CL 107  CO2 26  GLUCOSE 99  BUN 33*  CREATININE 1.60*  CALCIUM 9.0   Liver Function Tests: No results for input(s): AST, ALT, ALKPHOS, BILITOT, PROT, ALBUMIN in the last 168 hours. No results for input(s): LIPASE, AMYLASE in the last 168 hours. No results for input(s): AMMONIA in the last 168  hours. CBC:  Recent Labs Lab 07/08/14 1532 07/09/14 0635  07/09/14 2216 07/10/14 0605 07/10/14 1520 07/10/14 2206 07/11/14 0720 07/12/14 0435  WBC 9.5 7.2  --  9.0  --   --   --  8.8 11.1*  NEUTROABS 6.8  --   --   --   --   --   --   --   --   HGB 10.1* 7.1*  < > 7.2* 7.9* 8.3* 8.0* 7.6* 8.9*  HCT 30.6* 21.4*  < > 21.4* 23.3* 24.7* 23.8* 22.7* 25.8*  MCV 92.7 93.9  --  91.5  --   --   --  87.3  88.4  PLT 135* 115*  --  110*  --   --   --  97* 123*  < > = values in this interval not displayed. Cardiac Enzymes: No results for input(s): CKTOTAL, CKMB, CKMBINDEX, TROPONINI in the last 168 hours. BNP: BNP (last 3 results) No results for input(s): PROBNP in the last 8760 hours. CBG: No results for input(s): GLUCAP in the last 168 hours.     SignedEsperanza Sheets  [facility] 07/12/2014, 11:45 AM

## 2014-07-12 NOTE — Discharge Instructions (Signed)
For dressing changes:  Apply bactracin gel to wound then place non-adhesive, vaseline gauze directly on to skin tear wounds and cover with dry gauze.  Secure gauze with tape.  If vaseline gauze is difficult to remove, wet lightly with saline or water.  Remove old dressings carefully from wounds.  Some bleeding may occur during dressing change.    This should be done at least twice a day.  It is okay for patient to shower, but he should not take a bath.

## 2014-07-12 NOTE — Progress Notes (Signed)
Pt given discharge instructions, PIV removed, and prescriptions given.  Reviewed dressing changes with wife and family.  Reassured family that home health will be established and RN will be available to help with dressing changes and that PT will help with therapy.  Pt taken to discharge location via wheelchair.

## 2014-07-15 ENCOUNTER — Telehealth: Payer: Self-pay | Admitting: Internal Medicine

## 2014-07-15 NOTE — Telephone Encounter (Signed)
Will have Melissa call and schedule with an extender.

## 2014-07-15 NOTE — Telephone Encounter (Signed)
New problem   Pt's wife Dewayne Hatch calling.   Stating pt was discharged from hospital 07/12/14 and per discharged instructions pt need to come in to see Dr [name] in 3 days. There were no appts available. Please advise.

## 2014-07-17 ENCOUNTER — Ambulatory Visit (INDEPENDENT_AMBULATORY_CARE_PROVIDER_SITE_OTHER): Payer: Medicare Other | Admitting: *Deleted

## 2014-07-17 ENCOUNTER — Ambulatory Visit (INDEPENDENT_AMBULATORY_CARE_PROVIDER_SITE_OTHER): Payer: Medicare Other | Admitting: Physician Assistant

## 2014-07-17 ENCOUNTER — Other Ambulatory Visit: Payer: Self-pay | Admitting: *Deleted

## 2014-07-17 ENCOUNTER — Encounter: Payer: Self-pay | Admitting: Physician Assistant

## 2014-07-17 VITALS — BP 124/62 | HR 76 | Ht 64.0 in | Wt 150.0 lb

## 2014-07-17 DIAGNOSIS — F1010 Alcohol abuse, uncomplicated: Secondary | ICD-10-CM

## 2014-07-17 DIAGNOSIS — I714 Abdominal aortic aneurysm, without rupture, unspecified: Secondary | ICD-10-CM

## 2014-07-17 DIAGNOSIS — I5022 Chronic systolic (congestive) heart failure: Secondary | ICD-10-CM

## 2014-07-17 DIAGNOSIS — R0602 Shortness of breath: Secondary | ICD-10-CM

## 2014-07-17 DIAGNOSIS — I480 Paroxysmal atrial fibrillation: Secondary | ICD-10-CM

## 2014-07-17 DIAGNOSIS — I5032 Chronic diastolic (congestive) heart failure: Secondary | ICD-10-CM

## 2014-07-17 DIAGNOSIS — I428 Other cardiomyopathies: Secondary | ICD-10-CM

## 2014-07-17 DIAGNOSIS — I429 Cardiomyopathy, unspecified: Secondary | ICD-10-CM

## 2014-07-17 DIAGNOSIS — I4891 Unspecified atrial fibrillation: Secondary | ICD-10-CM

## 2014-07-17 DIAGNOSIS — Z950 Presence of cardiac pacemaker: Secondary | ICD-10-CM

## 2014-07-17 DIAGNOSIS — I6523 Occlusion and stenosis of bilateral carotid arteries: Secondary | ICD-10-CM

## 2014-07-17 DIAGNOSIS — Z86718 Personal history of other venous thrombosis and embolism: Secondary | ICD-10-CM

## 2014-07-17 LAB — BASIC METABOLIC PANEL
BUN: 37 mg/dL — ABNORMAL HIGH (ref 6–23)
CALCIUM: 8.8 mg/dL (ref 8.4–10.5)
CHLORIDE: 101 meq/L (ref 96–112)
CO2: 25 meq/L (ref 19–32)
CREATININE: 1.9 mg/dL — AB (ref 0.50–1.35)
Glucose, Bld: 92 mg/dL (ref 70–99)
Potassium: 4.6 mEq/L (ref 3.5–5.3)
SODIUM: 137 meq/L (ref 135–145)

## 2014-07-17 LAB — MDC_IDC_ENUM_SESS_TYPE_INCLINIC

## 2014-07-17 MED ORDER — ASPIRIN EC 81 MG PO TBEC: 81.0000 mg | DELAYED_RELEASE_TABLET | Freq: Every day | ORAL | Status: DC

## 2014-07-17 NOTE — Progress Notes (Signed)
Pacer interrogation for SW for events r/t fall 07/07/14 and Optivol reading.  See PaceArt for full report.

## 2014-07-17 NOTE — Progress Notes (Signed)
Cardiology Office Note   Date:  07/17/2014   ID:  Phillip Kelley, DOB 02-19-1927, MRN [id number]  PCP:  [name], MD  Cardiologist:  Previously Dr. Jacinto Halim  Electrophysiologist:  Dr. Lewayne Bunting    History of Present Illness: Phillip Kelley is a 80 y.o. male with a history of nonischemic cardiomyopathy, systolic CHF with an EF of 35-40% in the past, LBBB, HTN, symptomatic bradycardia status post CRT-P in 01/2012, carotid stenosis s/p L CEA 08/2008 (Dr. Hart [place]), alcohol abuse, prior DVT, PAF. Last echocardiogram in 09/2012 demonstrated normalized LV function with vigorous LVEF 65-70%.   He was admitted 12/22-12/26 with a mechanical fall at home. Patient lost his balance. He denied syncope. He had significant skin tears to his back with blood loss anemia. He also had a dislocated shoulder. Attempts at reduction were unsuccessful. He required 2 units of FFP and 1 unit of PRBCs.  Hgb dropped to a nadir of 6.7.  He was followed by the Trauma service.  Xarelto was stopped due to concerns over fall risk outweighing the benefit.  ASA was to be started one week after DC.  He was asked to FU with cardiology to further determine his candidacy for anticoagulation.  Of note, 3 cm AAA was noted on Abd/Pelvic CT.    He is here today with his daughter and wife. He arrives in a wheelchair. He tells me that he has a long history of falling. When asked, he tells me he has fallen at least 3 times in the past 6 months. His wife states that the number of falls is greater than 3.  He does admit to a significant amount of alcohol intake. He tells me that he has not had anything to drink since he was discharged from the hospital. Home health is dressing his wounds on his back. These are improved. He has been sleeping in a recliner. This is for both comfort as well as for shortness of breath. Overall, he feels that his breathing is somewhat worse. His LE edema is chronic. His left leg is wrapped. He denies PND. He  denies chest pain. He denies syncope.   Studies:   - Echo (3/14):  Severe LVH, vigorous LV function, EF 65-70%, normal wall motion, grade 1 diastolic dysfunction, paradoxical motion of the ventricular septum, moderate LAE  - Nuclear (11/12 - Dr. Verl Dicker office): Normal EF, no ischemia.  - Carotid US (10/13):  R 40-59%; L CEA patent   Recent Labs: 07/08/2014: BUN 33*; Creatinine 1.60*; Potassium 4.5; Sodium 142 07/12/2014: Hemoglobin 8.9*     Recent Labs  07/08/14 1532 07/09/14 0635 07/09/14 1354 07/09/14 2216 07/10/14 0605 07/10/14 1520 07/10/14 2206 07/11/14 0720 07/12/14 0435  HGB 10.1* 7.1* 6.7* 7.2* 7.9* 8.3* 8.0* 7.6* 8.9*  HCT 30.6* 21.4* 19.9* 21.4* 23.3* 24.7* 23.8* 22.7* 25.8*      Estimated Creatinine Clearance: 28.3 mL/min (by C-G formula based on Cr of 1.6).    Recent Radiology: Ct Abdomen Pelvis W Contrast  07/09/2014  IMPRESSION: 1. There is an infrarenal abdominal aortic aneurysm measuring 3 cm in AP diameter. There is a right common iliac artery aneurysm measuring 2.4 cm in diameter. There is a right common iliac artery aneurysm measuring 2.1 cm. There is a left internal iliac artery aneurysm measuring 3.1 cm in diameter. 2. No acute abdominal or pelvic pathology. 3. Diverticulosis without evidence of diverticulitis. 4. Bibasilar atelectasis, right worse than left. 5. Indeterminate 16 mm hypodense left renal mass.   Electronically Signed  By: Elige Ko   On: 07/09/2014 18:18    Wt Readings from Last 3 Encounters:  07/17/14 150 lb (68.04 kg)  07/11/14 159 lb 6.3 oz (72.3 kg)  05/07/14 148 lb 9.6 oz (67.405 kg)     Past Medical History  Diagnosis Date  . Cataract     "right needs taken out" (01/25/12)  . Hypertension   . Chronic systolic CHF (congestive heart failure)   . Symptomatic bradycardia 01/25/12    s/p Medtronic BiV PPM  . Anemia     "my RBC's are low"  . History of blood transfusion 2001    "w/hip operation"  . Arthritis     "all  over"  . Chronic lower back pain     "arthritis"  . Heart block     s/p Medtronic BiV PPM  . Leg edema   . DVT (deep vein thrombosis) in pregnancy     Current Outpatient Prescriptions  Medication Sig Dispense Refill  . acetaminophen (TYLENOL) 500 MG tablet Take 500 mg by mouth every 6 (six) hours as needed.    . bacitracin 500 UNIT/GM ointment Apply 1 application topically 2 (two) times daily. 15 g 0  . bacitracin ointment Apply 1 application topically 2 (two) times daily. 120 g 0  . Calcium Carbonate-Vitamin D (CALCIUM-VITAMIN D) 500-200 MG-UNIT per tablet Take 1 tablet by mouth daily.     Marland Kitchen FLUZONE HIGH-DOSE 0.5 ML SUSY   0  . furosemide (LASIX) 20 MG tablet Take 20 mg by mouth daily.    . hydrALAZINE (APRESOLINE) 25 MG tablet Take 25 mg by mouth 2 (two) times daily.    . Linaclotide (LINZESS) 145 MCG CAPS capsule Take 145 mcg by mouth.    . Multiple Vitamin (MULTIVITAMIN) tablet Take 1 tablet by mouth daily.    . mupirocin ointment (BACTROBAN) 2 %     . oxyCODONE (OXY IR/ROXICODONE) 5 MG immediate release tablet Take 1 tablet (5 mg total) by mouth every 4 (four) hours as needed for moderate pain. 30 tablet 0  . Tamsulosin HCl (FLOMAX) 0.4 MG CAPS Take 0.4 mg by mouth 2 (two) times daily.      No current facility-administered medications for this visit.     Allergies:   Penicillins; Sulfa antibiotics; and Losartan   Social History:  The patient  reports that he quit smoking about 36 years ago. His smoking use included Cigarettes. He has a 35 pack-year smoking history. He has never used smokeless tobacco. He reports that he drinks about 12.6 oz of alcohol per week.   Family History:  The patient's family history includes Congestive Heart Failure in his mother.    ROS:  Please see the history of present illness.       All other systems reviewed and negative.    PHYSICAL EXAM: VS:  BP 124/62 mmHg  Pulse 76  Ht 5\' 4"  (1.626 m)  Wt 150 lb (68.04 kg)  BMI 25.73 kg/m2 Well  nourished, well developed, in no acute distress HEENT: normal Neck: no  JVD 90 degrees Cardiac:  normal S1, S2;  RRR;  no murmur   Lungs:  Decreased breath sounds bilaterally, no wheezing, rhonchi or rales Abd: soft, nontender, no hepatomegaly Ext: 1+ bilateral LE edemaL leg wrapped Skin: warm and dry Neuro:  CNs 2-12 intact, no focal abnormalities noted  EKG:  Underlying NSR, V paced, HR 77     Device Interrogation:  OptiVol demonstrates increased fluid status up until the last few  days. Measurements are now back to baseline. He had 3 nonsustained episodes. However these were brief and only occurred in October and November. They do not correspond with his fall in December.   ASSESSMENT AND PLAN:   1. Paroxysmal Atrial Fibrillation:  Maintaining NSR.  He has significant risk factors for stroke.  But, he also has significant bleeding risk now with a life threatening bleed from a mechanical fall.      -  CHADS2-VASc=5 (annual risk of Stroke 10%).      -  HAS-BLED=4 (9.4% risk of annual bleed).      -  I would keep him off of anticoagulation for now.      -  We could eventually consider Coumadin depending on his status (or Pradaxa since there is a reversal agent now).  2. History of DVT:  I reviewed his chart.  This occurred in 10/2013.  It was unprovoked.  I cannot find the doppler study in the chart (Care Everywhere).  He seems to think it was below the knee.  He has completed more than 6 mos of therapy.      -  As noted, I do not think he is a candidate for anticoagulation at this time.      -  If he has a recurrent DVT, he would likely need an IVC filter.  3. Nonischemic Cardiomyopathy:  EF had improved by last echo.  He now notes significant dyspnea.  I suggested a FU echo to reassess his LVF.  However, he declines.  This can be reconsidered at FU if his breathing is no better or worse.   4. Chronic Diastolic CHF:  Optivol on his device is back to baseline.  He notes increased dyspnea and  edema.      -  Check a BMET and BNP today.      -  Increase Lasix to 40 mg QD x 3 days, then resume 20 mg QD.      -  Check BMET 1 week (with Home Health).   5. S/p CRT-P:  Device checked today.  He had 3 episodes of NSVT in Oct and Nov.     -  FU with EP as planned.  6. AAA:  3 cm by CT.  He is declining further testing (echo).  He is not likely a candidate for surgery.      -  Consider FU abdominal US in 1 year. 7. Carotid Stenosis s/p L CEA:  As noted, he is declining further testing.  If he opts for the echo, could consider arranging FU carotid duplex. He no longer sees [facility].   8. ETOH Abuse:  He tells me that he has stopped drinking.  I explained the importance of this to him.    Disposition:   FU with Dr. Lewayne BuntingGregg Taylor 1 month.    Signed, Brynda RimScott Karalee Hauter, PA-C, MHS 07/17/2014 11:25 AM    [facility] [address], [place], [place]  [address] Phone: [phone]; Fax: [phone]

## 2014-07-17 NOTE — Patient Instructions (Signed)
Your physician has recommended you make the following change in your medication:   START Aspirin 81 mg on Monday 07/21/14 unless wounds are still oozing   INCREASE Lasix to 40 mg daily for three days, then reduce back to 20 mg daily  Your physician recommends that you have lab work today for a BMET and BNP  Prescription for home health nurse to repeat blood work in 7 to 10 days with results fax to Tereso Newcomer PA [phone]  Your physician recommends that you schedule a follow-up appointment in: 1 month with Dr. [name]

## 2014-07-18 LAB — BRAIN NATRIURETIC PEPTIDE: Brain Natriuretic Peptide: 354.8 pg/mL — ABNORMAL HIGH (ref 0.0–100.0)

## 2014-07-22 ENCOUNTER — Telehealth: Payer: Self-pay | Admitting: *Deleted

## 2014-07-22 NOTE — Telephone Encounter (Signed)
Ptcb and has been notfied of lab results and that he should be back on his regular dose of lasix 20 mg daily. Pt said he will verify this with his wfie. I said if he has any questions tcb, pt said thank you for my help today.

## 2014-07-28 ENCOUNTER — Encounter: Payer: Self-pay | Admitting: Internal Medicine

## 2014-08-11 ENCOUNTER — Encounter: Payer: Medicare Other | Admitting: *Deleted

## 2014-08-11 ENCOUNTER — Telehealth: Payer: Self-pay | Admitting: Cardiology

## 2014-08-11 NOTE — Telephone Encounter (Signed)
Spoke with pt and reminded pt of remote transmission that is due today. Pt verbalized understanding.   

## 2014-08-12 ENCOUNTER — Encounter: Payer: Self-pay | Admitting: Cardiology

## 2014-08-19 ENCOUNTER — Ambulatory Visit (INDEPENDENT_AMBULATORY_CARE_PROVIDER_SITE_OTHER): Payer: Medicare Other | Admitting: Internal Medicine

## 2014-08-19 ENCOUNTER — Encounter: Payer: Self-pay | Admitting: Internal Medicine

## 2014-08-19 VITALS — BP 132/80 | HR 68 | Ht 66.0 in | Wt 147.2 lb

## 2014-08-19 DIAGNOSIS — I480 Paroxysmal atrial fibrillation: Secondary | ICD-10-CM

## 2014-08-19 DIAGNOSIS — Z950 Presence of cardiac pacemaker: Secondary | ICD-10-CM

## 2014-08-19 DIAGNOSIS — I5032 Chronic diastolic (congestive) heart failure: Secondary | ICD-10-CM

## 2014-08-19 DIAGNOSIS — I5022 Chronic systolic (congestive) heart failure: Secondary | ICD-10-CM

## 2014-08-19 DIAGNOSIS — F1010 Alcohol abuse, uncomplicated: Secondary | ICD-10-CM

## 2014-08-19 LAB — MDC_IDC_ENUM_SESS_TYPE_INCLINIC
Battery Remaining Longevity: 61 mo
Brady Statistic AP VP Percent: 48 %
Brady Statistic AS VP Percent: 51.46 %
Brady Statistic AS VS Percent: 0.49 %
Date Time Interrogation Session: 20160202144337
Lead Channel Impedance Value: 304 Ohm
Lead Channel Impedance Value: 380 Ohm
Lead Channel Impedance Value: 418 Ohm
Lead Channel Impedance Value: 456 Ohm
Lead Channel Impedance Value: 551 Ohm
Lead Channel Impedance Value: 589 Ohm
Lead Channel Pacing Threshold Amplitude: 0.375 V
Lead Channel Pacing Threshold Amplitude: 0.75 V
Lead Channel Pacing Threshold Amplitude: 0.875 V
Lead Channel Pacing Threshold Pulse Width: 0.4 ms
Lead Channel Pacing Threshold Pulse Width: 0.8 ms
Lead Channel Sensing Intrinsic Amplitude: 1.375 mV
Lead Channel Sensing Intrinsic Amplitude: 20.875 mV
Lead Channel Sensing Intrinsic Amplitude: 22.375 mV
Lead Channel Setting Pacing Amplitude: 2.5 V
Lead Channel Setting Pacing Pulse Width: 0.4 ms
MDC IDC MSMT BATTERY VOLTAGE: 3.01 V
MDC IDC MSMT LEADCHNL RA IMPEDANCE VALUE: 323 Ohm
MDC IDC MSMT LEADCHNL RA PACING THRESHOLD PULSEWIDTH: 0.4 ms
MDC IDC MSMT LEADCHNL RA SENSING INTR AMPL: 1.375 mV
MDC IDC MSMT LEADCHNL RV IMPEDANCE VALUE: 437 Ohm
MDC IDC MSMT LEADCHNL RV IMPEDANCE VALUE: 494 Ohm
MDC IDC SET LEADCHNL LV PACING AMPLITUDE: 1.5 V
MDC IDC SET LEADCHNL LV PACING PULSEWIDTH: 0.8 ms
MDC IDC SET LEADCHNL RA PACING AMPLITUDE: 2 V
MDC IDC SET LEADCHNL RV SENSING SENSITIVITY: 0.9 mV
MDC IDC SET ZONE DETECTION INTERVAL: 350 ms
MDC IDC SET ZONE DETECTION INTERVAL: 400 ms
MDC IDC STAT BRADY AP VS PERCENT: 0.05 %
MDC IDC STAT BRADY RA PERCENT PACED: 48.04 %
MDC IDC STAT BRADY RV PERCENT PACED: 99.46 %

## 2014-08-19 MED ORDER — AMIODARONE HCL 100 MG PO TABS: 100.0000 mg | ORAL_TABLET | Freq: Every day | ORAL | Status: DC

## 2014-08-19 NOTE — Patient Instructions (Signed)
Your physician recommends that you schedule a follow-up appointment in: 3 months with Dr. [name]   Your physician has recommended you make the following change in your medication:  1) Start Amiodarone 100 mg daily

## 2014-08-19 NOTE — Assessment & Plan Note (Signed)
Interogation of his medtronic device demonstrates normal device function.

## 2014-08-19 NOTE — Assessment & Plan Note (Signed)
He is still drinking in excess and he has been counseled to stop drinking.

## 2014-08-19 NOTE — Assessment & Plan Note (Signed)
He still has class 2 symptoms despite normalization of his rhythm with BiV pacing.

## 2014-08-19 NOTE — Assessment & Plan Note (Signed)
He is maintaining NSR over 95% of the time. He cannot be anti-coagulated. I am very concerned about stroke. I have recommended he try taking low dose amiodarone in hopes of making his atrial fib duration reduced. I have warned him that we have know way to know for sure that reducing his amount of PAF will reduce his risk of stroke but I think it is worth a trial.

## 2014-08-19 NOTE — Progress Notes (Signed)
HPI Mr. Peduzzi returns today for followup. He is a very pleasant 81 year old man with a nonischemic cardiomyopathy, chronic systolic heart failure, history of alcohol abuse, severe problems with gait and balance, and hypertension. When I saw the patient last several months ago, he had persistent and chronic weakness and fatigue which appears to be a bit improved as has his problem with shingles. In He has rare palpitations. No syncope. He uses a walker. His peripheral edema is also better though he does not deny sodium excess. The patient fell several weeks ago and landed on his back and had a laceration which would not stop bleeding and require 3 units of PRBC's for transfusion while Xarelto was held. He has had no additional Xarelto. The patient has a h/o arterial embolism to his leg thought secondary to his atrial fib.  Allergies  Allergen Reactions  . Oxycodone     Pt was VERY confused and didn't know where he was at  . Penicillins Rash  . Sulfa Antibiotics Rash  . Losartan     Hyperkalemia      Current Outpatient Prescriptions  Medication Sig Dispense Refill  . acetaminophen (TYLENOL) 500 MG tablet Take 500 mg by mouth every 6 (six) hours as needed (pain).     Marland Kitchen aspirin EC 81 MG tablet Take 1 tablet (81 mg total) by mouth daily.    . Calcium Carbonate-Vitamin D (CALCIUM-VITAMIN D) 500-200 MG-UNIT per tablet Take 1 tablet by mouth daily.     . furosemide (LASIX) 20 MG tablet Take 20 mg by mouth daily.    . hydrALAZINE (APRESOLINE) 25 MG tablet Take 25 mg by mouth 2 (two) times daily.    . Multiple Vitamin (MULTIVITAMIN) tablet Take 1 tablet by mouth daily.    . mupirocin ointment (BACTROBAN) 2 % Apply 1 application topically daily.     . Tamsulosin HCl (FLOMAX) 0.4 MG CAPS Take 0.4 mg by mouth 2 (two) times daily.      No current facility-administered medications for this visit.     Past Medical History  Diagnosis Date  . Cataract     "right needs taken out" (01/25/12)  .  Hypertension   . Chronic systolic CHF (congestive heart failure)   . Symptomatic bradycardia 01/25/12    s/p Medtronic BiV PPM  . Anemia     "my RBC's are low"  . History of blood transfusion 2001    "w/hip operation"  . Arthritis     "all over"  . Chronic lower back pain     "arthritis"  . Heart block     s/p Medtronic BiV PPM  . Leg edema   . DVT (deep vein thrombosis) in pregnancy     ROS:   All systems reviewed and negative except as noted in the HPI.   Past Surgical History  Procedure Laterality Date  . Anterior fusion cervical spine  2001    for numbness in right arm, left with anhydrosis  . Total hip arthroplasty  2001    right; fell  . Carotid endarterectomy  ~ 2008    left  . Carpal tunnel release      bilaterally  . Insert / replace / remove pacemaker  01/25/12    Medtronic BiV PPM  . Tonsillectomy and adenoidectomy  ~ 1933  . Joint replacement    . Cataract extraction w/ intraocular lens implant  ~2011    left  . Ganglion cyst excision      ? left  .  Bi-ventricular pacemaker insertion N/A 01/25/2012    Procedure: BI-VENTRICULAR PACEMAKER INSERTION (CRT-P);  Surgeon: Marinus Maw, MD;  Location: [facility] CATH LAB;  Service: Cardiovascular;  Laterality: N/A;     Family History  Problem Relation Age of Onset  . Congestive Heart Failure Mother      History   Social History  . Marital Status: Married    Spouse Name: N/A    Number of Children: N/A  . Years of Education: N/A   Occupational History  . Not on file.   Social History Main Topics  . Smoking status: Former Smoker -- 1.00 packs/day for 35 years    Types: Cigarettes    Quit date: 08/21/1977  . Smokeless tobacco: Never Used  . Alcohol Use: 12.6 oz/week    14 Glasses of wine, 7 Shots of liquor per week     Comment: As much as possible  . Drug Use: Not on file  . Sexual Activity: Not Currently   Other Topics Concern  . Not on file   Social History Narrative     BP 132/80 mmHg  Pulse  68  Ht  (1.676 m)  Wt 147 lb 3.2 oz (66.769 kg)  BMI 23.77 kg/m2  Physical Exam:  Chronically ill appearing elderly man, NAD HEENT: Unremarkable Neck:  6 cm JVD, no thyromegally Back:  No CVA tenderness Lungs:  Clear with no wheezes, rales, or rhonchi. HEART:  Regular rate rhythm, no murmurs, no rubs, no clicks Abd:  soft, positive bowel sounds, no organomegally, no rebound, no guarding, scabbing, weaping erythmatous lesion Ext:  2 plus pulses, no edema, no cyanosis, no clubbing Skin:  No rashes no nodules Neuro:  CN II through XII intact, motor grossly intact   DEVICE  Normal device function.  See PaceArt for details.   Assess/Plan:

## 2014-08-20 ENCOUNTER — Encounter: Payer: Self-pay | Admitting: Internal Medicine

## 2014-09-09 ENCOUNTER — Encounter: Payer: Medicare Other | Admitting: Internal Medicine

## 2014-10-09 ENCOUNTER — Telehealth: Payer: Self-pay | Admitting: Internal Medicine

## 2014-10-09 NOTE — Telephone Encounter (Signed)
Calling stating he has been more SOB x 1 week. Denies wt gain, states minimal swelling in ankles.  States he keeps legs elevated when he is sitting.  Has a physical therapist that comes out 3 x a week and she states he has very little swelling in ankles.  No other symptoms other than the SOB.  Is taking Lasix 20 mg daily.  Spoke w/Dr. [name] who states he can increase Lasix to 40 mg daily x 3 days then return to normal dose.  Advised pt to increase Lasix for 3 days but if still having the SOB to call our office.  He verbalizes understanding and will call if SOB does not resolve.

## 2014-10-09 NOTE — Telephone Encounter (Signed)
New Msg     Pt c/o Shortness Of Breath: STAT if SOB developed within the last 24 hours or pt is noticeably SOB on the phone  1. Are you currently SOB (can you hear that pt is SOB on the phone)? Yes 2. How long have you been experiencing SOB? One week 3. Are you SOB when sitting or when up moving around? both 4.  Are you currently experiencing any other symptoms? No   Please return pt call.

## 2014-10-24 ENCOUNTER — Telehealth: Payer: Self-pay | Admitting: Internal Medicine

## 2014-10-24 NOTE — Telephone Encounter (Signed)
Spoke with the patient and he woke up early this morning around 5 am with pain in the right leg he thought from the compresion stockings.. He took them off and the pain and swelling are better.  He took 2 of his Furosemide pills today.  Swelling is better, no redness and his leg is not hot to touch.  He will keep his dosage the same and call us if needed on Monday

## 2014-10-24 NOTE — Telephone Encounter (Signed)
New Message  Pt c/o swelling: STAT is pt has developed SOB within 24 hours  1. How long have you been experiencing swelling? This morning  2. Where is the swelling located? Right leg  3.  Are you currently taking a "fluid pill"? Taking Lasix- not this morning  4.  Are you currently SOB? Have been, but not right now.  5.  Have you traveled recently? No

## 2014-10-27 ENCOUNTER — Telehealth: Payer: Self-pay | Admitting: Internal Medicine

## 2014-10-27 NOTE — Telephone Encounter (Signed)
New message     Pt c/o Shortness Of Breath: STAT if SOB developed within the last 24 hours or pt is noticeably SOB on the phone  1. Are you currently SOB (can you hear that pt is SOB on the phone)? Not on phone 2. How long have you been experiencing SOB? Since last week 3. Are you SOB when sitting or when up moving around? both  4. Are you currently experiencing any other symptoms?  3+ edema on his lower ext, pt has bruising all over and generalized weakness,

## 2014-10-27 NOTE — Telephone Encounter (Signed)
Spoke with patient's caregiver at home.  He does have swelling in his lower extremities and did take an extra fluid pill on Fri.  He says when it was increased for 3 days by Dr [name] the swelling was some better.  I have asked that he come in tomorrow to have his legs assessed.    I also spoke with the case worker for him

## 2014-10-28 ENCOUNTER — Encounter: Payer: Self-pay | Admitting: Nurse Practitioner

## 2014-10-28 ENCOUNTER — Encounter: Payer: Self-pay | Admitting: Internal Medicine

## 2014-10-28 ENCOUNTER — Ambulatory Visit (INDEPENDENT_AMBULATORY_CARE_PROVIDER_SITE_OTHER): Payer: Medicare Other | Admitting: *Deleted

## 2014-10-28 ENCOUNTER — Ambulatory Visit (INDEPENDENT_AMBULATORY_CARE_PROVIDER_SITE_OTHER): Payer: Medicare Other | Admitting: Nurse Practitioner

## 2014-10-28 VITALS — BP 122/64 | HR 70 | Ht 66.0 in | Wt 148.0 lb

## 2014-10-28 DIAGNOSIS — I5032 Chronic diastolic (congestive) heart failure: Secondary | ICD-10-CM | POA: Diagnosis not present

## 2014-10-28 DIAGNOSIS — I5033 Acute on chronic diastolic (congestive) heart failure: Secondary | ICD-10-CM

## 2014-10-28 DIAGNOSIS — I10 Essential (primary) hypertension: Secondary | ICD-10-CM | POA: Insufficient documentation

## 2014-10-28 DIAGNOSIS — F1010 Alcohol abuse, uncomplicated: Secondary | ICD-10-CM

## 2014-10-28 DIAGNOSIS — I480 Paroxysmal atrial fibrillation: Secondary | ICD-10-CM

## 2014-10-28 LAB — MDC_IDC_ENUM_SESS_TYPE_INCLINIC
Brady Statistic AP VP Percent: 68 %
Brady Statistic AS VS Percent: 0.3 %
Lead Channel Impedance Value: 418 Ohm
Lead Channel Impedance Value: 475 Ohm
Lead Channel Impedance Value: 570 Ohm
Lead Channel Pacing Threshold Amplitude: 0.75 V
Lead Channel Pacing Threshold Pulse Width: 0.4 ms
Lead Channel Pacing Threshold Pulse Width: 0.4 ms
Lead Channel Pacing Threshold Pulse Width: 0.8 ms
Lead Channel Sensing Intrinsic Amplitude: 1.8 mV
Lead Channel Sensing Intrinsic Amplitude: 14.8 mV
Lead Channel Setting Pacing Amplitude: 1.5 V
Lead Channel Setting Pacing Amplitude: 2 V
Lead Channel Setting Pacing Pulse Width: 0.8 ms
Lead Channel Setting Sensing Sensitivity: 0.9 mV
MDC IDC MSMT LEADCHNL LV PACING THRESHOLD AMPLITUDE: 0.875 V
MDC IDC MSMT LEADCHNL RV PACING THRESHOLD AMPLITUDE: 0.5 V
MDC IDC SET LEADCHNL RV PACING AMPLITUDE: 2.5 V
MDC IDC SET LEADCHNL RV PACING PULSEWIDTH: 0.4 ms
MDC IDC SET ZONE DETECTION INTERVAL: 350 ms
MDC IDC STAT BRADY AP VS PERCENT: 0.2 %
MDC IDC STAT BRADY AS VP PERCENT: 31.6 %
Zone Setting Detection Interval: 400 ms

## 2014-10-28 LAB — BASIC METABOLIC PANEL
BUN: 35 mg/dL — ABNORMAL HIGH (ref 6–23)
CO2: 31 mEq/L (ref 19–32)
CREATININE: 1.92 mg/dL — AB (ref 0.40–1.50)
Calcium: 9.1 mg/dL (ref 8.4–10.5)
Chloride: 104 mEq/L (ref 96–112)
GFR: 35.48 mL/min — ABNORMAL LOW (ref >60.00)
Glucose, Bld: 122 mg/dL — ABNORMAL HIGH (ref 70–99)
POTASSIUM: 4.3 meq/L (ref 3.5–5.1)
Sodium: 139 mEq/L (ref 135–145)

## 2014-10-28 MED ORDER — FUROSEMIDE 20 MG PO TABS: 20.0000 mg | ORAL_TABLET | Freq: Every day | ORAL | Status: DC

## 2014-10-28 NOTE — Progress Notes (Signed)
Patient Name: Phillip Kelley Date of Encounter: 10/28/2014  Primary Care Provider:  [name], MD Primary Cardiologist:  [name]. [name], MD   Chief Complaint  81 year old male with a history of chronic diastolic CHF who presents with progressive lower extremity edema.  Past Medical History   Past Medical History  Diagnosis Date  . Cataract   . Hypertension   . Chronic diastolic CHF (congestive heart failure)     a. 09/2014 Echo: EF 65-70%, sev LVH, Gr 1 DD, mod dil LA.  Marland Kitchen Symptomatic bradycardia     a. 01/2012 s/p Medtronic BiV PPM  . Anemia   . History of blood transfusion 2001    following hip surgery.  . Arthritis   . Chronic lower back pain   . Leg edema   . History of DVT (deep vein thrombosis)   . NICM (nonischemic cardiomyopathy)     a. 09/2014 Echo: EF 65-70%.  . Gait instability   . ETOH abuse    Past Surgical History  Procedure Laterality Date  . Anterior fusion cervical spine  2001    for numbness in right arm, left with anhydrosis  . Total hip arthroplasty  2001    right; fell  . Carotid endarterectomy  ~ 2008    left  . Carpal tunnel release      bilaterally  . Insert / replace / remove pacemaker  01/25/12    Medtronic BiV PPM  . Tonsillectomy and adenoidectomy  ~ 1933  . Joint replacement    . Cataract extraction w/ intraocular lens implant  ~2011    left  . Ganglion cyst excision      ? left  . Bi-ventricular pacemaker insertion N/A 01/25/2012    Procedure: BI-VENTRICULAR PACEMAKER INSERTION (CRT-P);  Surgeon: Marinus Maw, MD;  Location: [facility] CATH LAB;  Service: Cardiovascular;  Laterality: N/A;    Allergies  Allergies  Allergen Reactions  . Oxycodone Other (See Comments)    Pt was VERY confused and didn't know where he was at  . Penicillins Rash  . Sulfa Antibiotics Rash  . Losartan Other (See Comments)    Hyperkalemia    HPI  81 year old male with the above complex problem list. He has a history of chronic diastolic CHF, some  degree of chronic lower extremity edema, EtOH abuse, and paroxysmal atrial fibrillation. He was last seen in clinic by Dr. [name] in February. In March, he began to experience dyspnea and lower extremity edema and his Lasix was increased to 40 mg daily 3 days. He did have some improvement but has since had recurrence of her lower extremity edema as well as dyspnea with any amount of activity. He is generally sedentary and spends much of his day in a recliner. He does wear compression hose but a day or 2 ago, he had to take them off because the swelling in his legs had increased a point that it felt as though they were being strangled by the compression hose. He has not been having PND, orthopnea, dizziness, syncope, or early satiety. His weight is only up 1 pound since his last visit. His wife reports that he drank heavily over the weekend. He usually has a glass of wine and a highball with dinner. Apparently over the weekend he drank wine and beer for 3 days straight.  Home Medications  Prior to Admission medications   Medication Sig Start Date End Date Taking? Authorizing Provider  acetaminophen (TYLENOL) 500 MG tablet Take 500 mg by  mouth every 6 (six) hours as needed (pain).    Yes Historical Provider, MD  amiodarone (PACERONE) 200 MG tablet Take 100 mg by mouth daily. Patient taking by mouth one half tablet (100 mg) daily 08/19/14  Yes Historical Provider, MD  aspirin EC 81 MG tablet Take 1 tablet (81 mg total) by mouth daily. 07/17/14  Yes Beatrice Lecher, PA-C  Calcium Carbonate-Vitamin D (CALCIUM-VITAMIN D) 500-200 MG-UNIT per tablet Take 1 tablet by mouth daily.    Yes Historical Provider, MD  furosemide (LASIX) 20 MG tablet Take 20 mg by mouth daily.   Yes Historical Provider, MD  hydrALAZINE (APRESOLINE) 25 MG tablet Take 25 mg by mouth 2 (two) times daily.   Yes Historical Provider, MD  Multiple Vitamin (MULTIVITAMIN) tablet Take 1 tablet by mouth daily.   Yes Historical Provider, MD    mupirocin ointment (BACTROBAN) 2 % Apply 1 application topically daily.  09/04/13  Yes Historical Provider, MD  Tamsulosin HCl (FLOMAX) 0.4 MG CAPS Take 0.4 mg by mouth 2 (two) times daily.    Yes Historical Provider, MD    Review of Systems  As above, he has been experiencing dyspnea and lower extremity edema. He denies PND, orthopnea, dizziness, syncope, chest pain, or early satiety. He has not had any palpitations. All other systems reviewed and are otherwise negative except as noted above.  Physical Exam  VS:  BP 122/64 mmHg  Pulse 70  Ht  (1.676 m)  Wt 148 lb (67.132 kg)  BMI 23.90 kg/m2 , BMI Body mass index is 23.9 kg/(m^2). GEN: Well nourished, well developed, in no acute distress. HEENT: normal. Neck: Supple, no JVD, carotid bruits, or masses. Cardiac: RRR, rubs, or gallops. 2/6 systolic murmur at the upper sternal border. No clubbing, cyanosis. 3+ bilateral lower extremity edema to the knees.  Radials/DP/PT 2+ and equal bilaterally.  Respiratory:  Respirations regular and unlabored, clear to auscultation bilaterally. GI: Soft, nontender, nondistended, BS + x 4. MS: no deformity or atrophy. Skin: warm and dry, no rash. Bruising noted to lower extremities with dressings bilaterally. Neuro:  Strength and sensation are intact. Psych: Normal affect.  Accessory Clinical Findings  ECG - A-V paced, 70, no acute ST or T changes.  His device was interrogated and did not show any atrial fibrillation over the weekend. He did have a 10 beat run of nonsustained ventricular tachycardia at some point in March.  Assessment & Plan  1.  Acute on chronic diastolic congestive heart failure: Patient presents with a several week history of progressive lower extremity edema and dyspnea. He had previously had his Lasix dose adjusted with improvement however edema has since returned. His wife notes that he drank heavily over the weekend and thinks that the increase in fluid intake may be  contributing. He is careful about his salt intake. He recently had an echocardiogram showing normal LV function. I will increase his Lasix to 40 mg daily for the next week and then drop back down to 20 mg daily. I recommended continued daily weights. I've offered to follow up with him in clinic next week however they would prefer to weigh him at home and call if he continues to have problems. He does have a CNA and also home health nurse that comes out to evaluate. He currently has follow-up scheduled with Dr. [name] later this month.  2. Paroxysmal atrial fib relation: He is AV paced today and has not had any recent A. fib. He has been counseled on  the importance of alcohol cessation. He remains on amiodarone and is tolerating this. He is not on anticoagulation secondary to prior history of falls and unsteady gait.  3. Hypertension: Stable.  4. Alcohol abuse: Complete cessation advised. He is not interested in quitting and seems to understand that excessive alcohol likely contributed to his A. fib burden.  5. Disposition: Increase Lasix over the next week. Patient, family, and home health nursing will continue to assess his status and contact us if he does not improve or worsens. He is followed with Dr. [name] at the end of the month.    Nicolasa Ducking, NP 10/28/2014, 2:58 PM

## 2014-10-28 NOTE — Patient Instructions (Addendum)
Medication Instructions:  Increase lasix ( 20 mg ) twice a day for one week. After one week Lasix ( 20 mg ) daily.  New prescription sent into pharamacy today per patient's request  Labwork: bmet  Testing/Procedures: None  Follow-Up: Will call us if patient is not getting any better to make appointment Patient has appointment to see Dr. [name] on May 12 @ 2:15  Any Other Special Instructions Will Be Listed Below (If Applicable).  Getting Device checked today.

## 2014-10-28 NOTE — Progress Notes (Signed)
CRT-P device check in clinic. Normal device function. Thresholds, sensing, impedance consistent with previous measurements. Histograms appropriate for patient and level of activity. 1 NSVT episode, 10 beats. Patient bi-ventricularly pacing >99% of the time. Device programmed with appropriate safety margins. Device heart failure diagnostics are within normal limits and stable over time. Estimated longevity 4.5 years. ROV on 11/27/2014 with GT.

## 2014-11-27 ENCOUNTER — Encounter: Payer: Self-pay | Admitting: Internal Medicine

## 2014-11-27 ENCOUNTER — Ambulatory Visit (INDEPENDENT_AMBULATORY_CARE_PROVIDER_SITE_OTHER): Payer: Medicare Other | Admitting: Internal Medicine

## 2014-11-27 VITALS — BP 126/64 | HR 70 | Ht 62.0 in

## 2014-11-27 DIAGNOSIS — I480 Paroxysmal atrial fibrillation: Secondary | ICD-10-CM

## 2014-11-27 DIAGNOSIS — R2689 Other abnormalities of gait and mobility: Secondary | ICD-10-CM

## 2014-11-27 DIAGNOSIS — I5022 Chronic systolic (congestive) heart failure: Secondary | ICD-10-CM | POA: Diagnosis not present

## 2014-11-27 DIAGNOSIS — Z950 Presence of cardiac pacemaker: Secondary | ICD-10-CM

## 2014-11-27 DIAGNOSIS — F1010 Alcohol abuse, uncomplicated: Secondary | ICD-10-CM

## 2014-11-27 DIAGNOSIS — I5032 Chronic diastolic (congestive) heart failure: Secondary | ICD-10-CM

## 2014-11-27 DIAGNOSIS — I10 Essential (primary) hypertension: Secondary | ICD-10-CM

## 2014-11-27 DIAGNOSIS — R29818 Other symptoms and signs involving the nervous system: Secondary | ICD-10-CM

## 2014-11-27 LAB — CUP PACEART INCLINIC DEVICE CHECK
Battery Remaining Longevity: 52 mo
Battery Voltage: 3 V
Brady Statistic AP VP Percent: 70.25 %
Brady Statistic AP VS Percent: 0.04 %
Brady Statistic AS VP Percent: 29.52 %
Brady Statistic AS VS Percent: 0.19 %
Brady Statistic RA Percent Paced: 70.29 %
Brady Statistic RA Percent Paced: 70.29 %
Brady Statistic RV Percent Paced: 99.77 %
Brady Statistic RV Percent Paced: 99.77 %
Date Time Interrogation Session: 20160512171619
Date Time Interrogation Session: 20160512150407
Lead Channel Impedance Value: 456 Ohm
Lead Channel Impedance Value: 627 Ohm
Lead Channel Impedance Value: 361 Ohm
Lead Channel Impedance Value: 418 Ohm
Lead Channel Impedance Value: 475 Ohm
Lead Channel Impedance Value: 494 Ohm
Lead Channel Impedance Value: 570 Ohm
Lead Channel Pacing Threshold Amplitude: 0.5 V
Lead Channel Pacing Threshold Amplitude: 0.5 V
Lead Channel Pacing Threshold Amplitude: 0.75 V
Lead Channel Pacing Threshold Amplitude: 0.75 V
Lead Channel Pacing Threshold Pulse Width: 0.4 ms
Lead Channel Pacing Threshold Pulse Width: 0.4 ms
Lead Channel Pacing Threshold Pulse Width: 0.8 ms
Lead Channel Pacing Threshold Pulse Width: 0.4 ms
Lead Channel Sensing Intrinsic Amplitude: 1 mV
Lead Channel Setting Pacing Amplitude: 1.25 V
Lead Channel Setting Pacing Amplitude: 2 V
Lead Channel Setting Pacing Amplitude: 2.5 V
Lead Channel Setting Pacing Amplitude: 2 V
Lead Channel Setting Pacing Pulse Width: 0.8 ms
Lead Channel Setting Sensing Sensitivity: 0.9 mV
Lead Channel Setting Sensing Sensitivity: 0.9 mV
MDC IDC MSMT BATTERY REMAINING LONGEVITY: 52 mo
MDC IDC MSMT BATTERY VOLTAGE: 3 V
MDC IDC MSMT LEADCHNL LV IMPEDANCE VALUE: 285 Ohm
MDC IDC MSMT LEADCHNL LV IMPEDANCE VALUE: 361 Ohm
MDC IDC MSMT LEADCHNL LV IMPEDANCE VALUE: 494 Ohm
MDC IDC MSMT LEADCHNL LV IMPEDANCE VALUE: 570 Ohm
MDC IDC MSMT LEADCHNL LV IMPEDANCE VALUE: 285 Ohm
MDC IDC MSMT LEADCHNL LV IMPEDANCE VALUE: 627 Ohm
MDC IDC MSMT LEADCHNL LV PACING THRESHOLD AMPLITUDE: 0.75 V
MDC IDC MSMT LEADCHNL LV PACING THRESHOLD PULSEWIDTH: 0.8 ms
MDC IDC MSMT LEADCHNL RA IMPEDANCE VALUE: 342 Ohm
MDC IDC MSMT LEADCHNL RA IMPEDANCE VALUE: 342 Ohm
MDC IDC MSMT LEADCHNL RA IMPEDANCE VALUE: 456 Ohm
MDC IDC MSMT LEADCHNL RA PACING THRESHOLD AMPLITUDE: 0.75 V
MDC IDC MSMT LEADCHNL RA SENSING INTR AMPL: 1 mV
MDC IDC MSMT LEADCHNL RV IMPEDANCE VALUE: 418 Ohm
MDC IDC MSMT LEADCHNL RV IMPEDANCE VALUE: 475 Ohm
MDC IDC MSMT LEADCHNL RV PACING THRESHOLD PULSEWIDTH: 0.4 ms
MDC IDC SET LEADCHNL LV PACING AMPLITUDE: 1.25 V
MDC IDC SET LEADCHNL LV PACING PULSEWIDTH: 0.8 ms
MDC IDC SET LEADCHNL RV PACING AMPLITUDE: 2.5 V
MDC IDC SET LEADCHNL RV PACING PULSEWIDTH: 0.4 ms
MDC IDC SET LEADCHNL RV PACING PULSEWIDTH: 0.4 ms
MDC IDC SET ZONE DETECTION INTERVAL: 350 ms
MDC IDC STAT BRADY AP VP PERCENT: 70.25 %
MDC IDC STAT BRADY AP VS PERCENT: 0.04 %
MDC IDC STAT BRADY AS VP PERCENT: 29.52 %
MDC IDC STAT BRADY AS VS PERCENT: 0.19 %
Zone Setting Detection Interval: 350 ms
Zone Setting Detection Interval: 400 ms
Zone Setting Detection Interval: 400 ms

## 2014-11-27 NOTE — Assessment & Plan Note (Signed)
His medtronic BiV PM is working normally. Will recheck in several months. 

## 2014-11-27 NOTE — Assessment & Plan Note (Signed)
He appears to be maintaining NSR. He is no longer a candidate for systemic anti-coagulation due to his fall/bleeding risk. He will continue amiodarone.

## 2014-11-27 NOTE — Assessment & Plan Note (Signed)
His blood pressure is normal. Will continue his current meds. We discussed the importance of a low sodium diet and reduction in ETOH.

## 2014-11-27 NOTE — Assessment & Plan Note (Signed)
He continues to drink ETOH in excess. I suspect this is part of his weakness and peripheral edema.

## 2014-11-27 NOTE — Progress Notes (Signed)
HPI Phillip Kelley returns today for followup. He is a very pleasant 81 year old man with a nonischemic cardiomyopathy, chronic systolic heart failure, history of alcohol abuse, severe problems with gait and balance, and hypertension. When I saw the patient last several months ago, he had persistent and chronic weakness and fatigue which persists. He has rare palpitations. No syncope. He uses a walker but more and more is in a wheel chair. His peripheral edema is also better though he does not deny sodium excess. The patient has stopped Xarelto after a life threatening fall/bleeding episode.  The patient has a h/o arterial embolism to his leg thought secondary to his atrial fib. He is very weak and is pending an evaluation with a neurologist.  Allergies  Allergen Reactions  . Oxycodone Other (See Comments)    Pt was VERY confused and didn't know where he was at  . Penicillins Rash  . Sulfa Antibiotics Rash  . Losartan Other (See Comments)    Hyperkalemia      Current Outpatient Prescriptions  Medication Sig Dispense Refill  . amiodarone (PACERONE) 200 MG tablet Take 100 mg by mouth daily. Patient taking by mouth one half tablet (100 mg) daily  3  . aspirin EC 81 MG tablet Take 1 tablet (81 mg total) by mouth daily.    . Calcium Carbonate-Vitamin D (CALCIUM-VITAMIN D) 500-200 MG-UNIT per tablet Take 1 tablet by mouth daily.     . furosemide (LASIX) 20 MG tablet Take 1 tablet (20 mg total) by mouth daily. 30 tablet 6  . hydrALAZINE (APRESOLINE) 25 MG tablet Take 25 mg by mouth 2 (two) times daily.    Marland Kitchen levothyroxine (SYNTHROID, LEVOTHROID) 50 MCG tablet Take 1 tablet by mouth daily.  0  . Multiple Vitamin (MULTIVITAMIN) tablet Take 1 tablet by mouth daily.    . mupirocin ointment (BACTROBAN) 2 % Apply 1 application topically daily as needed (itching).     . Tamsulosin HCl (FLOMAX) 0.4 MG CAPS Take 0.4 mg by mouth 2 (two) times daily.      No current facility-administered medications for this  visit.     Past Medical History  Diagnosis Date  . Cataract   . Hypertension   . Chronic diastolic CHF (congestive heart failure)     a. 09/2014 Echo: EF 65-70%, sev LVH, Gr 1 DD, mod dil LA.  Marland Kitchen Symptomatic bradycardia     a. 01/2012 s/p Medtronic BiV PPM  . Anemia   . History of blood transfusion 2001    following hip surgery.  . Arthritis   . Chronic lower back pain   . Leg edema   . History of DVT (deep vein thrombosis)   . NICM (nonischemic cardiomyopathy)     a. 09/2014 Echo: EF 65-70%.  . Gait instability   . ETOH abuse   . PAF (paroxysmal atrial fibrillation)     a. No OAC 2/2 unsteady gait/falls.    ROS:   All systems reviewed and negative except as noted in the HPI.   Past Surgical History  Procedure Laterality Date  . Anterior fusion cervical spine  2001    for numbness in right arm, left with anhydrosis  . Total hip arthroplasty  2001    right; fell  . Carotid endarterectomy  ~ 2008    left  . Carpal tunnel release      bilaterally  . Insert / replace / remove pacemaker  01/25/12    Medtronic BiV PPM  . Tonsillectomy and adenoidectomy  ~  1933  . Joint replacement    . Cataract extraction w/ intraocular lens implant  ~2011    left  . Ganglion cyst excision      ? left  . Bi-ventricular pacemaker insertion N/A 01/25/2012    Procedure: BI-VENTRICULAR PACEMAKER INSERTION (CRT-P);  Surgeon: Marinus Maw, MD;  Location: [facility] CATH LAB;  Service: Cardiovascular;  Laterality: N/A;     Family History  Problem Relation Age of Onset  . Congestive Heart Failure Mother      History   Social History  . Marital Status: Married    Spouse Name: N/A  . Number of Children: N/A  . Years of Education: N/A   Occupational History  . Not on file.   Social History Main Topics  . Smoking status: Former Smoker -- 1.00 packs/day for 35 years    Types: Cigarettes    Quit date: 08/21/1977  . Smokeless tobacco: Never Used  . Alcohol Use: 12.6 oz/week    14 Glasses  of wine, 7 Shots of liquor per week     Comment: As much as possible  . Drug Use: Not on file  . Sexual Activity: Not Currently   Other Topics Concern  . Not on file   Social History Narrative     BP 126/64 mmHg  Pulse 70  Ht 5\' 2"  (1.575 m)  Wt   Physical Exam:  Chronically ill appearing elderly man, NAD HEENT: Unremarkable Neck:  6 cm JVD, no thyromegally Back:  No CVA tenderness Lungs:  Clear with no wheezes, rales, or rhonchi. Well healed PPM incision HEART:  Regular rate rhythm, no murmurs, no rubs, no clicks Abd:  soft, positive bowel sounds, no organomegally, no rebound, no guarding, scabbing, weaping erythmatous lesion Ext:  2 plus pulses, 1+ peripheral edema, no cyanosis, no clubbing Skin:  No rashes no nodules   DEVICE  Normal device function.  See PaceArt for details.   Assess/Plan:

## 2014-11-27 NOTE — Patient Instructions (Addendum)
Medication Instructions:  Your physician recommends that you continue on your current medications as directed. Please refer to the Current Medication list given to you today.   Labwork: NONE  Testing/Procedures: NONE  Follow-Up: Your physician wants you to follow-up in: 6 months with Dr. [name]. You will receive a reminder letter in the mail two months in advance. If you don't receive a letter, please call our office to schedule the follow-up appointment.  Remote monitoring is used to monitor your Pacemaker or ICD from home. This monitoring reduces the number of office visits required to check your device to one time per year. It allows [place] to keep an eye on the functioning of your device to ensure it is working properly. You are scheduled for a device check from home on 02/26/2015. You may send your transmission at any time that day. If you have a wireless device, the transmission will be sent automatically. After your physician reviews your transmission, you will receive a postcard with your next transmission date.     Any Other Special Instructions Will Be Listed Below (If Applicable).

## 2014-11-27 NOTE — Assessment & Plan Note (Signed)
His EF has normalized by echo. He is limited by leg weakness though I suspect he would have at least class 2 symptoms if he could ambulate.

## 2014-11-27 NOTE — Assessment & Plan Note (Signed)
His balance, gait and weakness precede his amiodarone use.

## 2014-12-05 ENCOUNTER — Emergency Department (HOSPITAL_COMMUNITY): Payer: Medicare Other

## 2014-12-05 ENCOUNTER — Emergency Department (HOSPITAL_COMMUNITY)
Admission: EM | Admit: 2014-12-05 | Discharge: 2014-12-05 | Disposition: A | Discharge status: Home / Self Care | Payer: Medicare Other | Attending: Emergency Medicine | Admitting: Emergency Medicine

## 2014-12-05 ENCOUNTER — Encounter (HOSPITAL_COMMUNITY): Payer: Self-pay | Admitting: *Deleted

## 2014-12-05 DIAGNOSIS — Z880 Allergy status to penicillin: Secondary | ICD-10-CM | POA: Diagnosis not present

## 2014-12-05 DIAGNOSIS — Z79899 Other long term (current) drug therapy: Secondary | ICD-10-CM | POA: Diagnosis not present

## 2014-12-05 DIAGNOSIS — R5383 Other fatigue: Secondary | ICD-10-CM | POA: Insufficient documentation

## 2014-12-05 DIAGNOSIS — R609 Edema, unspecified: Secondary | ICD-10-CM | POA: Diagnosis not present

## 2014-12-05 DIAGNOSIS — Z792 Long term (current) use of antibiotics: Secondary | ICD-10-CM | POA: Insufficient documentation

## 2014-12-05 DIAGNOSIS — I158 Other secondary hypertension: Secondary | ICD-10-CM

## 2014-12-05 DIAGNOSIS — Z86718 Personal history of other venous thrombosis and embolism: Secondary | ICD-10-CM | POA: Diagnosis not present

## 2014-12-05 DIAGNOSIS — Z87891 Personal history of nicotine dependence: Secondary | ICD-10-CM | POA: Diagnosis not present

## 2014-12-05 DIAGNOSIS — G8929 Other chronic pain: Secondary | ICD-10-CM | POA: Insufficient documentation

## 2014-12-05 DIAGNOSIS — M1990 Unspecified osteoarthritis, unspecified site: Secondary | ICD-10-CM | POA: Insufficient documentation

## 2014-12-05 DIAGNOSIS — R531 Weakness: Secondary | ICD-10-CM

## 2014-12-05 DIAGNOSIS — Z7982 Long term (current) use of aspirin: Secondary | ICD-10-CM | POA: Diagnosis not present

## 2014-12-05 DIAGNOSIS — H9202 Otalgia, left ear: Secondary | ICD-10-CM | POA: Insufficient documentation

## 2014-12-05 DIAGNOSIS — I5032 Chronic diastolic (congestive) heart failure: Secondary | ICD-10-CM | POA: Insufficient documentation

## 2014-12-05 DIAGNOSIS — Z862 Personal history of diseases of the blood and blood-forming organs and certain disorders involving the immune mechanism: Secondary | ICD-10-CM | POA: Diagnosis not present

## 2014-12-05 LAB — CBC
HCT: 34.2 % — ABNORMAL LOW (ref 39.0–52.0)
HEMOGLOBIN: 11.2 g/dL — AB (ref 13.0–17.0)
MCH: 29.9 pg (ref 26.0–34.0)
MCHC: 32.7 g/dL (ref 30.0–36.0)
MCV: 91.2 fL (ref 78.0–100.0)
PLATELETS: 171 10*3/uL (ref 150–400)
RBC: 3.75 MIL/uL — AB (ref 4.22–5.81)
RDW: 17.2 % — ABNORMAL HIGH (ref 11.5–15.5)
WBC: 8.9 10*3/uL (ref 4.0–10.5)

## 2014-12-05 LAB — BASIC METABOLIC PANEL
ANION GAP: 12 (ref 5–15)
BUN: 31 mg/dL — ABNORMAL HIGH (ref 6–20)
CO2: 24 mmol/L (ref 22–32)
Calcium: 9.5 mg/dL (ref 8.9–10.3)
Chloride: 104 mmol/L (ref 101–111)
Creatinine, Ser: 1.67 mg/dL — ABNORMAL HIGH (ref 0.61–1.24)
GFR calc non Af Amer: 36 mL/min — ABNORMAL LOW (ref >60)
GFR, EST AFRICAN AMERICAN: 41 mL/min — AB (ref >60)
Glucose, Bld: 88 mg/dL (ref 65–99)
POTASSIUM: 4.5 mmol/L (ref 3.5–5.1)
SODIUM: 140 mmol/L (ref 135–145)

## 2014-12-05 LAB — URINALYSIS, ROUTINE W REFLEX MICROSCOPIC
BILIRUBIN URINE: NEGATIVE
Glucose, UA: NEGATIVE mg/dL
Hgb urine dipstick: NEGATIVE
Ketones, ur: NEGATIVE mg/dL
Leukocytes, UA: NEGATIVE
NITRITE: NEGATIVE
Protein, ur: 100 mg/dL — AB
SPECIFIC GRAVITY, URINE: 1.013 (ref 1.005–1.030)
UROBILINOGEN UA: 0.2 mg/dL (ref 0.0–1.0)
pH: 6.5 (ref 5.0–8.0)

## 2014-12-05 LAB — HEPATIC FUNCTION PANEL
ALK PHOS: 70 U/L (ref 38–126)
ALT: 13 U/L — ABNORMAL LOW (ref 17–63)
AST: 19 U/L (ref 15–41)
Albumin: 3.5 g/dL (ref 3.5–5.0)
BILIRUBIN DIRECT: 0.2 mg/dL (ref 0.1–0.5)
BILIRUBIN TOTAL: 0.7 mg/dL (ref 0.3–1.2)
Indirect Bilirubin: 0.5 mg/dL (ref 0.3–0.9)
TOTAL PROTEIN: 6.4 g/dL — AB (ref 6.5–8.1)

## 2014-12-05 LAB — I-STAT TROPONIN, ED
TROPONIN I, POC: 0.02 ng/mL (ref 0.00–0.08)
Troponin i, poc: 0.02 ng/mL (ref 0.00–0.08)

## 2014-12-05 LAB — URINE MICROSCOPIC-ADD ON

## 2014-12-05 LAB — I-STAT CG4 LACTIC ACID, ED: LACTIC ACID, VENOUS: 1.08 mmol/L (ref 0.5–2.0)

## 2014-12-05 LAB — BRAIN NATRIURETIC PEPTIDE: B Natriuretic Peptide: 697 pg/mL — ABNORMAL HIGH (ref 0.0–100.0)

## 2014-12-05 MED ORDER — FUROSEMIDE 10 MG/ML IJ SOLN
40.0000 mg | Freq: Once | INTRAMUSCULAR | Status: AC
  Administered 2014-12-05: 40 mg via INTRAVENOUS
  Filled 2014-12-05: qty 4

## 2014-12-05 NOTE — ED Notes (Signed)
Per family, pt did not have appetite today and appeared weak, checked bp and it was elevated. Pt denies any pain other than left ear pain. Reports chronic sob. Airway intact at triage.

## 2014-12-05 NOTE — ED Provider Notes (Signed)
CSN: [id number]     Arrival date & time 12/05/14  1401 History   First MD Initiated Contact with Patient 12/05/14 1624     Chief Complaint  Patient presents with  . Weakness  . Fatigue  . Hypertension     (Consider location/radiation/quality/duration/timing/severity/associated sxs/prior Treatment) Patient is a 81 y.o. male presenting with weakness. The history is provided by the patient and the spouse.  Weakness This is a new problem. The current episode started today. The problem occurs constantly. The problem has been resolved. Associated symptoms include fatigue (per wife) and weakness. Pertinent negatives include no abdominal pain, change in bowel habit, chest pain, chills, congestion, coughing, diaphoresis, fever, headaches, nausea, neck pain, numbness, rash, sore throat, urinary symptoms, vertigo, visual change or vomiting. Nothing aggravates the symptoms. He has tried nothing for the symptoms. The treatment provided no relief.    Past Medical History  Diagnosis Date  . Cataract   . Hypertension   . Chronic diastolic CHF (congestive heart failure)     a. 09/2014 Echo: EF 65-70%, sev LVH, Gr 1 DD, mod dil LA.  Marland Kitchen Symptomatic bradycardia     a. 01/2012 s/p Medtronic BiV PPM  . Anemia   . History of blood transfusion 2001    following hip surgery.  . Arthritis   . Chronic lower back pain   . Leg edema   . History of DVT (deep vein thrombosis)   . NICM (nonischemic cardiomyopathy)     a. 09/2014 Echo: EF 65-70%.  . Gait instability   . ETOH abuse   . PAF (paroxysmal atrial fibrillation)     a. No OAC 2/2 unsteady gait/falls.   Past Surgical History  Procedure Laterality Date  . Anterior fusion cervical spine  2001    for numbness in right arm, left with anhydrosis  . Total hip arthroplasty  2001    right; fell  . Carotid endarterectomy  ~ 2008    left  . Carpal tunnel release      bilaterally  . Insert / replace / remove pacemaker  01/25/12    Medtronic BiV PPM  .  Tonsillectomy and adenoidectomy  ~ 1933  . Joint replacement    . Cataract extraction w/ intraocular lens implant  ~2011    left  . Ganglion cyst excision      ? left  . Bi-ventricular pacemaker insertion N/A 01/25/2012    Procedure: BI-VENTRICULAR PACEMAKER INSERTION (CRT-P);  Surgeon: Marinus Maw, MD;  Location: [facility] CATH LAB;  Service: Cardiovascular;  Laterality: N/A;   Family History  Problem Relation Age of Onset  . Congestive Heart Failure Mother    History  Substance Use Topics  . Smoking status: Former Smoker -- 1.00 packs/day for 35 years    Types: Cigarettes    Quit date: 08/21/1977  . Smokeless tobacco: Never Used  . Alcohol Use: 12.6 oz/week    14 Glasses of wine, 7 Shots of liquor per week     Comment: As much as possible    Review of Systems  Constitutional: Positive for fatigue (per wife). Negative for fever, chills, diaphoresis and appetite change.  HENT: Positive for ear pain. Negative for congestion, rhinorrhea and sore throat.   Eyes: Negative for photophobia and visual disturbance.  Respiratory: Negative for cough, chest tightness, shortness of breath and wheezing.   Cardiovascular: Positive for leg swelling. Negative for chest pain and palpitations.  Gastrointestinal: Negative for nausea, vomiting, abdominal pain, diarrhea, abdominal distention and change in bowel  habit.  Genitourinary: Negative for dysuria, frequency, flank pain and difficulty urinating.  Musculoskeletal: Negative for neck pain and neck stiffness.  Skin: Negative for color change, pallor and rash.  Neurological: Positive for weakness. Negative for dizziness, vertigo, syncope, facial asymmetry, speech difficulty, light-headedness, numbness and headaches.  All other systems reviewed and are negative.     Allergies  Oxycodone; Penicillins; Sulfa antibiotics; and Losartan  Home Medications   Prior to Admission medications   Medication Sig Start Date End Date Taking? Authorizing  Provider  amiodarone (PACERONE) 200 MG tablet Take 100 mg by mouth daily. Patient taking by mouth one half tablet (100 mg) daily 08/19/14  Yes Historical Provider, MD  aspirin EC 81 MG tablet Take 1 tablet (81 mg total) by mouth daily. 07/17/14  Yes Beatrice Lecher, PA-C  Calcium Carbonate-Vitamin D (CALCIUM-VITAMIN D) 500-200 MG-UNIT per tablet Take 1 tablet by mouth daily.    Yes Historical Provider, MD  furosemide (LASIX) 20 MG tablet Take 1 tablet (20 mg total) by mouth daily. 10/28/14  Yes Ok Anis, NP  hydrALAZINE (APRESOLINE) 25 MG tablet Take 25 mg by mouth 2 (two) times daily.   Yes Historical Provider, MD  levothyroxine (SYNTHROID, LEVOTHROID) 50 MCG tablet Take 1 tablet by mouth daily. 11/19/14  Yes Historical Provider, MD  Multiple Vitamin (MULTIVITAMIN) tablet Take 1 tablet by mouth daily.   Yes Historical Provider, MD  mupirocin ointment (BACTROBAN) 2 % Apply 1 application topically daily as needed (itching).  09/04/13  Yes Historical Provider, MD  Tamsulosin HCl (FLOMAX) 0.4 MG CAPS Take 0.4 mg by mouth 2 (two) times daily.    Yes Historical Provider, MD   BP 168/76 mmHg  Pulse 70  Temp(Src) 98.3 F (36.8 C) (Oral)  Resp 27  SpO2 95% Physical Exam  Constitutional: He is oriented to person, place, and time. He appears well-developed and well-nourished. No distress.  HENT:  Head: Normocephalic and atraumatic.  Right Ear: External ear normal. No drainage, swelling or tenderness. No mastoid tenderness. No middle ear effusion.  Left Ear: External ear normal. No drainage, swelling or tenderness. No mastoid tenderness.  No middle ear effusion.  Mouth/Throat: Oropharynx is clear and moist.  L ear with distorted anatomy, no TM present 2/2 multiple surgeries to ear related to war time injury.  No erythema, swelling, movement pain, or drainage  Eyes: Conjunctivae and EOM are normal. Pupils are equal, round, and reactive to light.  Neck: Normal range of motion. Neck supple.   Cardiovascular: Normal rate, regular rhythm, normal heart sounds and intact distal pulses.  Exam reveals no gallop and no friction rub.   No murmur heard. Pulmonary/Chest: Effort normal and breath sounds normal. No respiratory distress. He has no wheezes. He has no rales.  Abdominal: Soft. Bowel sounds are normal. He exhibits no distension. There is no tenderness. There is no rebound and no guarding.  Musculoskeletal: Normal range of motion. He exhibits edema (1+ b/l LE edema). He exhibits no tenderness.  Neurological: He is alert and oriented to person, place, and time. He has normal strength. No cranial nerve deficit or sensory deficit. He exhibits normal muscle tone. Coordination and gait normal. GCS eye subscore is 4. GCS verbal subscore is 5. GCS motor subscore is 6.  Skin: Skin is warm and dry. No rash noted. He is not diaphoretic. No erythema. No pallor.  Nursing note and vitals reviewed.   ED Course  Procedures (including critical care time) Labs Review Labs Reviewed  CBC - Abnormal;  Notable for the following:    RBC 3.75 (*)    Hemoglobin 11.2 (*)    HCT 34.2 (*)    RDW 17.2 (*)    All other components within normal limits  BASIC METABOLIC PANEL - Abnormal; Notable for the following:    BUN 31 (*)    Creatinine, Ser 1.67 (*)    GFR calc non Af Amer 36 (*)    GFR calc Af Amer 41 (*)    All other components within normal limits  BRAIN NATRIURETIC PEPTIDE - Abnormal; Notable for the following:    B Natriuretic Peptide 697.0 (*)    All other components within normal limits  URINALYSIS, ROUTINE W REFLEX MICROSCOPIC - Abnormal; Notable for the following:    Protein, ur 100 (*)    All other components within normal limits  HEPATIC FUNCTION PANEL - Abnormal; Notable for the following:    Total Protein 6.4 (*)    ALT 13 (*)    All other components within normal limits  URINE MICROSCOPIC-ADD ON - Abnormal; Notable for the following:    Casts HYALINE CASTS (*)    All other  components within normal limits  I-STAT TROPOININ, ED  I-STAT TROPOININ, ED  I-STAT CG4 LACTIC ACID, ED    Imaging Review No results found.   EKG Interpretation   Date/Time:  Friday Dec 05 2014 14:22:55 EDT Ventricular Rate:  73 PR Interval:  170 QRS Duration: 126 QT Interval:  438 QTC Calculation: 482 R Axis:   -87 Text Interpretation:  Atrial-paced rhythm Left axis deviation Non-specific  intra-ventricular conduction block Possible Anterolateral infarct , age  undetermined Abnormal ECG ATRIAL PACED RHYTHM Non-specific  intra-ventricular conduction delay Abnormal ekg Confirmed by Gerhard Munch  MD [phone]) on 12/05/2014 7:20:02 PM      MDM   Final diagnoses:  Generalized weakness  Other secondary hypertension    81 yo M with PMH of HTN, Afib not anticoagulated due to fall risk, CHF, NICM, EtOh abuse, presenting with hypertension, fatigue, generalized weakness.  CNA who cares for pt in afternoon reports that he appeared sleepy and fatigued today and was not hungry for lunch which is unusual for him.  She checked his BP and it was 175/100.  Called PCP who referred him to ED.  Pt reports L ear pain over past week with "black" drainage. No fevers, neck pain, HA, sore throat.  Denies chest pain. Has dyspnea at baseline, reports no acute changes in this.  No abdominal pain, N/V/D, or urinary sx.  Wife reports that his lower extremities have been more swollen than usual this week.  Has been taking lasix as directed.    On presentation, pt alert, AFVSS, in NAD.  BP 175/110 on arrival. Neuro exam with no focal deficits.  Exam of L ear with distorted anatomy, no TM present- pt reports multiple surgeries to ear related to war time injury.  No erythema, swelling, movement pain, or drainage. CV and lung exam WNL.  Abdomen benign.  1+ b/l LE edema.  No other acute findings. Will assess CT head, troponin, CXR, BNP to r/o HTN emergency or CHF exacerbation.  Check electrolytes.  U/A to r/o UTI.   EKG shows paced rhythm, no acute ischemic changes.  Exam of ear not consistent with otitis externa or mastoiditis.  Workup unrevealing, Cr at baseline.  Imaging WNL.  No evidence of pulmonary edema.  BNP is elevated to 660, no priors for comparison.  Pt reports feeling well and requesting  discharge- is at his baseline per wife.  Will give dose of IV Lasix for HTN and increased LE edema, and advised pt to increase Lasix to 40 mg for next 2 days, then return to normal dose and f/u closely with PCP.  ED return precautions given.  No other concerns.  Discussed with attending Dr. Jeraldine Loots.    Jodean Lima, MD 12/08/14 2351  Gerhard Munch, MD 12/17/14 [phone]

## 2014-12-05 NOTE — Discharge Instructions (Signed)
Take 40 mg (2 tablets) of your lasix once daily for the next 2 days.  After that, go back to your usual dose.  Follow up with your primary physician in 3 days.  Return to the ED if you have any other concerns.

## 2015-01-08 NOTE — Addendum Note (Signed)
Addended by: Armen Pickup T on: 01/08/2015 04:15 PM   Modules accepted: Orders

## 2015-02-02 ENCOUNTER — Telehealth: Payer: Self-pay | Admitting: Internal Medicine

## 2015-02-02 NOTE — Telephone Encounter (Signed)
New message      Pt has CHF.  Wife called EMS because bp was low and he was having trouble breathing.  EMS said he has crackles in his lungs, 80/50bp, and pt is still SOB and lft eye is glazed over.  EMS told pt to go to hospital but he refuse.  Wife want to talk to a nurse.

## 2015-02-02 NOTE — Telephone Encounter (Signed)
Instructed patient's wife, again, to encourage patient to go to the hospital. She st he is still adamant about not going and simply refuses. He refuses to talk on the phone. Instructed patient's wife of S/S to look for (acrocyanosis, worsening SOB) and instructed her to make sure he has plenty of pillows to sleep. Wife is angry patient will not go. Instructed her to follow-up with our office soon.

## 2015-02-02 NOTE — Telephone Encounter (Signed)
Patient's son called to report patient's condition. Earlier today, patient's in-home CNA called his son because his BP was 80/50. Patient's son, who is a Engineer, civil (consulting), called EMS and both the son and EMS agreed patient should be taken to the hospital. Patient was hypotensive, holding onto fluid (crackles present on auscultation), visibly SOB, and his L eye was blood shot and almost unresponsive to light. Patient adamantly refused to be taken to the hospital and signed AMA papers from EMS. Patient's son called [facility] under EMS instruction. Encouraged patient's son to try to get patient to agree to go to the hospital, as he more than likely needs IV diuretics. Patient's son st he does not want to go since Dr. [name] cannot tell him to go - he is not in the office. Informed patient's son that as a last effort, Dr. Lubertha Basque Device Tech will call to try to encourage patient to go to the hospital.

## 2015-02-03 ENCOUNTER — Telehealth: Payer: Self-pay | Admitting: Internal Medicine

## 2015-02-03 NOTE — Telephone Encounter (Signed)
New message     Have a question about his lasix.  He would not give me any more info

## 2015-02-03 NOTE — Telephone Encounter (Signed)
Spoke with patient and today his BP is better at 140/78.  Spoke with aide that is there at the house Phillip Kelley and she says his BP was better today and only has swelling in left leg and right eye was red.  He is not in any paine and does not appear SOB when talking to him.  Patient states he has been told in the past by Dr [name] it's ok to take extra fluid pill if needed for swelling or SOB.  He was SOB yesterday but his BP was 80/50.  I let him know he has to be careful taking extra fluid pills with his BP low.  I have instructed him to take an extra dose today as his BP will support  and that I would follow up with Dr [name] tomorrow and call him with Dr Bruna Potter recommendations   He verbalized understanding and agrees with plan

## 2015-02-04 NOTE — Telephone Encounter (Signed)
Discussed with Dr [name] and he says okay to take an extra fluid pill if his BP is above 110.  I have called and spoken with the wife and let her know his recommendations.  She says he took an extra dose yesterday and is better today.  They will continue to follow this plan for his SOB and call if further problems

## 2015-02-26 ENCOUNTER — Ambulatory Visit (INDEPENDENT_AMBULATORY_CARE_PROVIDER_SITE_OTHER): Payer: Medicare Other | Admitting: *Deleted

## 2015-02-26 ENCOUNTER — Telehealth: Payer: Self-pay | Admitting: Cardiology

## 2015-02-26 DIAGNOSIS — I429 Cardiomyopathy, unspecified: Secondary | ICD-10-CM

## 2015-02-26 DIAGNOSIS — I5022 Chronic systolic (congestive) heart failure: Secondary | ICD-10-CM | POA: Diagnosis not present

## 2015-02-26 DIAGNOSIS — I428 Other cardiomyopathies: Secondary | ICD-10-CM

## 2015-02-26 NOTE — Progress Notes (Signed)
Remote pacemaker transmission.   

## 2015-02-26 NOTE — Telephone Encounter (Signed)
Spoke with pt and reminded pt of remote transmission that is due today. Pt verbalized understanding.   

## 2015-02-27 LAB — CUP PACEART REMOTE DEVICE CHECK
Battery Voltage: 3 V
Brady Statistic AS VS Percent: 0.21 %
Brady Statistic RA Percent Paced: 74 %
Lead Channel Impedance Value: 361 Ohm
Lead Channel Impedance Value: 475 Ohm
Lead Channel Impedance Value: 589 Ohm
Lead Channel Pacing Threshold Amplitude: 0.5 V
Lead Channel Pacing Threshold Pulse Width: 0.4 ms
Lead Channel Pacing Threshold Pulse Width: 0.8 ms
Lead Channel Sensing Intrinsic Amplitude: 1.5 mV
Lead Channel Sensing Intrinsic Amplitude: 21 mV
Lead Channel Sensing Intrinsic Amplitude: 21 mV
Lead Channel Setting Pacing Amplitude: 2 V
Lead Channel Setting Pacing Amplitude: 2.5 V
Lead Channel Setting Pacing Pulse Width: 0.4 ms
Lead Channel Setting Pacing Pulse Width: 0.8 ms
MDC IDC MSMT BATTERY REMAINING LONGEVITY: 54 mo
MDC IDC MSMT LEADCHNL LV IMPEDANCE VALUE: 304 Ohm
MDC IDC MSMT LEADCHNL LV IMPEDANCE VALUE: 399 Ohm
MDC IDC MSMT LEADCHNL LV IMPEDANCE VALUE: 456 Ohm
MDC IDC MSMT LEADCHNL LV IMPEDANCE VALUE: 551 Ohm
MDC IDC MSMT LEADCHNL LV PACING THRESHOLD AMPLITUDE: 0.875 V
MDC IDC MSMT LEADCHNL RA PACING THRESHOLD AMPLITUDE: 0.875 V
MDC IDC MSMT LEADCHNL RA PACING THRESHOLD PULSEWIDTH: 0.4 ms
MDC IDC MSMT LEADCHNL RA SENSING INTR AMPL: 1.5 mV
MDC IDC MSMT LEADCHNL RV IMPEDANCE VALUE: 494 Ohm
MDC IDC MSMT LEADCHNL RV IMPEDANCE VALUE: 551 Ohm
MDC IDC SESS DTM: 20160812002425
MDC IDC SET LEADCHNL LV PACING AMPLITUDE: 1.5 V
MDC IDC SET LEADCHNL RV SENSING SENSITIVITY: 0.9 mV
MDC IDC SET ZONE DETECTION INTERVAL: 400 ms
MDC IDC STAT BRADY AP VP PERCENT: 73.91 %
MDC IDC STAT BRADY AP VS PERCENT: 0.09 %
MDC IDC STAT BRADY AS VP PERCENT: 25.79 %
MDC IDC STAT BRADY RV PERCENT PACED: 99.69 %
Zone Setting Detection Interval: 350 ms

## 2015-03-19 ENCOUNTER — Encounter: Payer: Self-pay | Admitting: Cardiology

## 2015-03-30 ENCOUNTER — Encounter: Payer: Self-pay | Admitting: Internal Medicine

## 2015-06-12 ENCOUNTER — Other Ambulatory Visit: Payer: Self-pay | Admitting: Nurse Practitioner

## 2015-06-17 ENCOUNTER — Inpatient Hospital Stay (HOSPITAL_COMMUNITY)
Admission: EM | Admit: 2015-06-17 | Discharge: 2015-06-24 | DRG: 683 | Disposition: A | Discharge status: Skilled Nursing Facility | Payer: Medicare Other | Attending: Internal Medicine | Admitting: Internal Medicine

## 2015-06-17 ENCOUNTER — Encounter (HOSPITAL_COMMUNITY): Payer: Self-pay | Admitting: Cardiology

## 2015-06-17 DIAGNOSIS — Z9842 Cataract extraction status, left eye: Secondary | ICD-10-CM | POA: Diagnosis not present

## 2015-06-17 DIAGNOSIS — I482 Chronic atrial fibrillation: Secondary | ICD-10-CM | POA: Diagnosis not present

## 2015-06-17 DIAGNOSIS — R296 Repeated falls: Secondary | ICD-10-CM | POA: Diagnosis present

## 2015-06-17 DIAGNOSIS — I480 Paroxysmal atrial fibrillation: Secondary | ICD-10-CM | POA: Diagnosis present

## 2015-06-17 DIAGNOSIS — L853 Xerosis cutis: Secondary | ICD-10-CM | POA: Diagnosis present

## 2015-06-17 DIAGNOSIS — Z961 Presence of intraocular lens: Secondary | ICD-10-CM | POA: Diagnosis present

## 2015-06-17 DIAGNOSIS — L8990 Pressure ulcer of unspecified site, unspecified stage: Secondary | ICD-10-CM | POA: Insufficient documentation

## 2015-06-17 DIAGNOSIS — I82409 Acute embolism and thrombosis of unspecified deep veins of unspecified lower extremity: Secondary | ICD-10-CM | POA: Diagnosis not present

## 2015-06-17 DIAGNOSIS — N170 Acute kidney failure with tubular necrosis: Principal | ICD-10-CM | POA: Diagnosis present

## 2015-06-17 DIAGNOSIS — N183 Chronic kidney disease, stage 3 unspecified: Secondary | ICD-10-CM | POA: Diagnosis present

## 2015-06-17 DIAGNOSIS — L03119 Cellulitis of unspecified part of limb: Secondary | ICD-10-CM

## 2015-06-17 DIAGNOSIS — R269 Unspecified abnormalities of gait and mobility: Secondary | ICD-10-CM

## 2015-06-17 DIAGNOSIS — G8929 Other chronic pain: Secondary | ICD-10-CM | POA: Diagnosis present

## 2015-06-17 DIAGNOSIS — Z881 Allergy status to other antibiotic agents status: Secondary | ICD-10-CM

## 2015-06-17 DIAGNOSIS — D649 Anemia, unspecified: Secondary | ICD-10-CM | POA: Diagnosis present

## 2015-06-17 DIAGNOSIS — Z880 Allergy status to penicillin: Secondary | ICD-10-CM | POA: Diagnosis not present

## 2015-06-17 DIAGNOSIS — I130 Hypertensive heart and chronic kidney disease with heart failure and stage 1 through stage 4 chronic kidney disease, or unspecified chronic kidney disease: Secondary | ICD-10-CM | POA: Diagnosis present

## 2015-06-17 DIAGNOSIS — I428 Other cardiomyopathies: Secondary | ICD-10-CM | POA: Diagnosis present

## 2015-06-17 DIAGNOSIS — Z86718 Personal history of other venous thrombosis and embolism: Secondary | ICD-10-CM

## 2015-06-17 DIAGNOSIS — M79601 Pain in right arm: Secondary | ICD-10-CM | POA: Diagnosis present

## 2015-06-17 DIAGNOSIS — F0390 Unspecified dementia without behavioral disturbance: Secondary | ICD-10-CM | POA: Diagnosis present

## 2015-06-17 DIAGNOSIS — L299 Pruritus, unspecified: Secondary | ICD-10-CM | POA: Diagnosis present

## 2015-06-17 DIAGNOSIS — J029 Acute pharyngitis, unspecified: Secondary | ICD-10-CM | POA: Diagnosis not present

## 2015-06-17 DIAGNOSIS — R609 Edema, unspecified: Secondary | ICD-10-CM | POA: Diagnosis not present

## 2015-06-17 DIAGNOSIS — Z882 Allergy status to sulfonamides status: Secondary | ICD-10-CM | POA: Diagnosis not present

## 2015-06-17 DIAGNOSIS — N400 Enlarged prostate without lower urinary tract symptoms: Secondary | ICD-10-CM | POA: Diagnosis present

## 2015-06-17 DIAGNOSIS — Z96641 Presence of right artificial hip joint: Secondary | ICD-10-CM | POA: Diagnosis present

## 2015-06-17 DIAGNOSIS — Z8249 Family history of ischemic heart disease and other diseases of the circulatory system: Secondary | ICD-10-CM

## 2015-06-17 DIAGNOSIS — Z87891 Personal history of nicotine dependence: Secondary | ICD-10-CM

## 2015-06-17 DIAGNOSIS — I5042 Chronic combined systolic (congestive) and diastolic (congestive) heart failure: Secondary | ICD-10-CM | POA: Diagnosis present

## 2015-06-17 DIAGNOSIS — N179 Acute kidney failure, unspecified: Secondary | ICD-10-CM | POA: Diagnosis not present

## 2015-06-17 DIAGNOSIS — Z7982 Long term (current) use of aspirin: Secondary | ICD-10-CM

## 2015-06-17 DIAGNOSIS — F1010 Alcohol abuse, uncomplicated: Secondary | ICD-10-CM | POA: Diagnosis present

## 2015-06-17 DIAGNOSIS — Z888 Allergy status to other drugs, medicaments and biological substances status: Secondary | ICD-10-CM | POA: Diagnosis not present

## 2015-06-17 DIAGNOSIS — I4891 Unspecified atrial fibrillation: Secondary | ICD-10-CM | POA: Diagnosis not present

## 2015-06-17 DIAGNOSIS — I509 Heart failure, unspecified: Secondary | ICD-10-CM | POA: Diagnosis not present

## 2015-06-17 DIAGNOSIS — L258 Unspecified contact dermatitis due to other agents: Secondary | ICD-10-CM | POA: Diagnosis present

## 2015-06-17 DIAGNOSIS — K5909 Other constipation: Secondary | ICD-10-CM | POA: Diagnosis present

## 2015-06-17 DIAGNOSIS — Z886 Allergy status to analgesic agent status: Secondary | ICD-10-CM | POA: Diagnosis not present

## 2015-06-17 DIAGNOSIS — I5022 Chronic systolic (congestive) heart failure: Secondary | ICD-10-CM | POA: Diagnosis not present

## 2015-06-17 DIAGNOSIS — R0902 Hypoxemia: Secondary | ICD-10-CM

## 2015-06-17 DIAGNOSIS — Z950 Presence of cardiac pacemaker: Secondary | ICD-10-CM | POA: Diagnosis not present

## 2015-06-17 DIAGNOSIS — M545 Low back pain: Secondary | ICD-10-CM | POA: Diagnosis present

## 2015-06-17 DIAGNOSIS — E860 Dehydration: Secondary | ICD-10-CM | POA: Diagnosis not present

## 2015-06-17 DIAGNOSIS — L89629 Pressure ulcer of left heel, unspecified stage: Secondary | ICD-10-CM | POA: Diagnosis not present

## 2015-06-17 DIAGNOSIS — R21 Rash and other nonspecific skin eruption: Secondary | ICD-10-CM

## 2015-06-17 DIAGNOSIS — I82622 Acute embolism and thrombosis of deep veins of left upper extremity: Secondary | ICD-10-CM | POA: Diagnosis present

## 2015-06-17 DIAGNOSIS — L89619 Pressure ulcer of right heel, unspecified stage: Secondary | ICD-10-CM | POA: Diagnosis not present

## 2015-06-17 DIAGNOSIS — I481 Persistent atrial fibrillation: Secondary | ICD-10-CM | POA: Diagnosis not present

## 2015-06-17 DIAGNOSIS — Z885 Allergy status to narcotic agent status: Secondary | ICD-10-CM

## 2015-06-17 DIAGNOSIS — L0390 Cellulitis, unspecified: Secondary | ICD-10-CM | POA: Diagnosis present

## 2015-06-17 DIAGNOSIS — M1990 Unspecified osteoarthritis, unspecified site: Secondary | ICD-10-CM | POA: Diagnosis present

## 2015-06-17 DIAGNOSIS — I10 Essential (primary) hypertension: Secondary | ICD-10-CM | POA: Diagnosis present

## 2015-06-17 DIAGNOSIS — R0600 Dyspnea, unspecified: Secondary | ICD-10-CM

## 2015-06-17 DIAGNOSIS — K5900 Constipation, unspecified: Secondary | ICD-10-CM | POA: Diagnosis present

## 2015-06-17 LAB — CBC WITH DIFFERENTIAL/PLATELET
BASOS PCT: 1 %
Basophils Absolute: 0.1 10*3/uL (ref 0.0–0.1)
EOS PCT: 9 %
Eosinophils Absolute: 0.9 10*3/uL — ABNORMAL HIGH (ref 0.0–0.7)
HEMATOCRIT: 29.6 % — AB (ref 39.0–52.0)
Hemoglobin: 9.8 g/dL — ABNORMAL LOW (ref 13.0–17.0)
Lymphocytes Relative: 10 %
Lymphs Abs: 1 10*3/uL (ref 0.7–4.0)
MCH: 31.7 pg (ref 26.0–34.0)
MCHC: 33.1 g/dL (ref 30.0–36.0)
MCV: 95.8 fL (ref 78.0–100.0)
MONO ABS: 1.3 10*3/uL — AB (ref 0.1–1.0)
MONOS PCT: 13 %
Neutro Abs: 6.8 10*3/uL (ref 1.7–7.7)
Neutrophils Relative %: 67 %
PLATELETS: 124 10*3/uL — AB (ref 150–400)
RBC: 3.09 MIL/uL — ABNORMAL LOW (ref 4.22–5.81)
RDW: 16.4 % — AB (ref 11.5–15.5)
WBC: 10.1 10*3/uL (ref 4.0–10.5)

## 2015-06-17 LAB — BASIC METABOLIC PANEL
ANION GAP: 8 (ref 5–15)
BUN: 37 mg/dL — ABNORMAL HIGH (ref 6–20)
CALCIUM: 9.4 mg/dL (ref 8.9–10.3)
CO2: 26 mmol/L (ref 22–32)
CREATININE: 2.57 mg/dL — AB (ref 0.61–1.24)
Chloride: 109 mmol/L (ref 101–111)
GFR, EST AFRICAN AMERICAN: 24 mL/min — AB (ref >60)
GFR, EST NON AFRICAN AMERICAN: 21 mL/min — AB (ref >60)
Glucose, Bld: 83 mg/dL (ref 65–99)
Potassium: 4.1 mmol/L (ref 3.5–5.1)
Sodium: 143 mmol/L (ref 135–145)

## 2015-06-17 LAB — PROTIME-INR
INR: 1.37 (ref 0.00–1.49)
Prothrombin Time: 16.9 seconds — ABNORMAL HIGH (ref 11.6–15.2)

## 2015-06-17 LAB — TSH: TSH: 2.364 u[IU]/mL (ref 0.350–4.500)

## 2015-06-17 LAB — APTT: APTT: 30 s (ref 24–37)

## 2015-06-17 LAB — C-REACTIVE PROTEIN: CRP: 1.2 mg/dL — ABNORMAL HIGH (ref <1.0)

## 2015-06-17 MED ORDER — SODIUM CHLORIDE 0.9 % IV SOLN
INTRAVENOUS | Status: AC
  Administered 2015-06-17: 19:00:00 via INTRAVENOUS

## 2015-06-17 MED ORDER — MAGNESIUM CITRATE PO SOLN: 1.0000 | Freq: Once | ORAL | Status: DC | PRN

## 2015-06-17 MED ORDER — VITAMIN B-1 100 MG PO TABS
100.0000 mg | ORAL_TABLET | Freq: Every day | ORAL | Status: DC
  Administered 2015-06-18 – 2015-06-24 (×7): 100 mg via ORAL
  Filled 2015-06-17 (×7): qty 1

## 2015-06-17 MED ORDER — ACETAMINOPHEN 650 MG RE SUPP: 650.0000 mg | Freq: Four times a day (QID) | RECTAL | Status: DC | PRN

## 2015-06-17 MED ORDER — ADULT MULTIVITAMIN W/MINERALS CH
1.0000 | ORAL_TABLET | Freq: Every day | ORAL | Status: DC
  Administered 2015-06-18 – 2015-06-24 (×7): 1 via ORAL
  Filled 2015-06-17 (×7): qty 1

## 2015-06-17 MED ORDER — LORAZEPAM 1 MG PO TABS
1.0000 mg | ORAL_TABLET | Freq: Four times a day (QID) | ORAL | Status: AC | PRN
  Administered 2015-06-20: 1 mg via ORAL
  Filled 2015-06-17: qty 1

## 2015-06-17 MED ORDER — CLINDAMYCIN HCL 300 MG PO CAPS
300.0000 mg | ORAL_CAPSULE | Freq: Four times a day (QID) | ORAL | Status: AC
  Administered 2015-06-17 – 2015-06-22 (×20): 300 mg via ORAL
  Filled 2015-06-17 (×20): qty 1

## 2015-06-17 MED ORDER — FLUCONAZOLE 100 MG PO TABS
50.0000 mg | ORAL_TABLET | ORAL | Status: DC
  Administered 2015-06-17 – 2015-06-18 (×2): 50 mg via ORAL
  Filled 2015-06-17 (×2): qty 1

## 2015-06-17 MED ORDER — LORAZEPAM 2 MG/ML IJ SOLN: 1.0000 mg | Freq: Four times a day (QID) | INTRAMUSCULAR | Status: AC | PRN

## 2015-06-17 MED ORDER — LORAZEPAM 2 MG/ML IJ SOLN
0.5000 mg | Freq: Once | INTRAMUSCULAR | Status: AC
  Administered 2015-06-17: 0.5 mg via INTRAVENOUS
  Filled 2015-06-17: qty 1

## 2015-06-17 MED ORDER — ACETAMINOPHEN 325 MG PO TABS: 650.0000 mg | ORAL_TABLET | Freq: Four times a day (QID) | ORAL | Status: DC | PRN

## 2015-06-17 MED ORDER — FOLIC ACID 1 MG PO TABS
1.0000 mg | ORAL_TABLET | Freq: Every day | ORAL | Status: DC
  Administered 2015-06-18 – 2015-06-24 (×7): 1 mg via ORAL
  Filled 2015-06-17 (×7): qty 1

## 2015-06-17 MED ORDER — HYDROMORPHONE HCL 1 MG/ML IJ SOLN: 0.5000 mg | INTRAMUSCULAR | Status: DC | PRN

## 2015-06-17 MED ORDER — AMIODARONE HCL 200 MG PO TABS
100.0000 mg | ORAL_TABLET | Freq: Every day | ORAL | Status: DC
  Administered 2015-06-18 – 2015-06-24 (×7): 100 mg via ORAL
  Filled 2015-06-17 (×7): qty 1

## 2015-06-17 MED ORDER — LEVOTHYROXINE SODIUM 50 MCG PO TABS
50.0000 ug | ORAL_TABLET | Freq: Every day | ORAL | Status: DC
  Administered 2015-06-18 – 2015-06-24 (×7): 50 ug via ORAL
  Filled 2015-06-17 (×7): qty 1

## 2015-06-17 MED ORDER — HYDRALAZINE HCL 25 MG PO TABS
25.0000 mg | ORAL_TABLET | Freq: Two times a day (BID) | ORAL | Status: DC
  Administered 2015-06-17 – 2015-06-23 (×13): 25 mg via ORAL
  Filled 2015-06-17 (×14): qty 1

## 2015-06-17 MED ORDER — TAMSULOSIN HCL 0.4 MG PO CAPS
0.4000 mg | ORAL_CAPSULE | Freq: Two times a day (BID) | ORAL | Status: DC
  Administered 2015-06-17 – 2015-06-24 (×14): 0.4 mg via ORAL
  Filled 2015-06-17 (×14): qty 1

## 2015-06-17 MED ORDER — FLUCONAZOLE 100 MG PO TABS: 150.0000 mg | ORAL_TABLET | Freq: Every day | ORAL | Status: DC

## 2015-06-17 MED ORDER — HYDROXYZINE HCL 25 MG PO TABS: 25.0000 mg | ORAL_TABLET | Freq: Three times a day (TID) | ORAL | Status: DC | PRN

## 2015-06-17 MED ORDER — ONDANSETRON HCL 4 MG/2ML IJ SOLN: 4.0000 mg | Freq: Four times a day (QID) | INTRAMUSCULAR | Status: DC | PRN

## 2015-06-17 MED ORDER — THIAMINE HCL 100 MG/ML IJ SOLN: 100.0000 mg | Freq: Every day | INTRAMUSCULAR | Status: DC

## 2015-06-17 MED ORDER — CLINDAMYCIN PHOSPHATE 600 MG/50ML IV SOLN
600.0000 mg | Freq: Once | INTRAVENOUS | Status: AC
  Administered 2015-06-17: 600 mg via INTRAVENOUS
  Filled 2015-06-17: qty 50

## 2015-06-17 MED ORDER — DOCUSATE SODIUM 100 MG PO CAPS
100.0000 mg | ORAL_CAPSULE | Freq: Two times a day (BID) | ORAL | Status: DC
  Administered 2015-06-17 – 2015-06-24 (×14): 100 mg via ORAL
  Filled 2015-06-17 (×14): qty 1

## 2015-06-17 MED ORDER — ONDANSETRON HCL 4 MG PO TABS
4.0000 mg | ORAL_TABLET | Freq: Four times a day (QID) | ORAL | Status: DC | PRN
Start: 2015-06-17 — End: 2015-06-24

## 2015-06-17 MED ORDER — BISACODYL 10 MG RE SUPP: 10.0000 mg | Freq: Every day | RECTAL | Status: DC | PRN

## 2015-06-17 MED ORDER — ASPIRIN EC 81 MG PO TBEC
81.0000 mg | DELAYED_RELEASE_TABLET | Freq: Every day | ORAL | Status: DC
  Administered 2015-06-18 – 2015-06-24 (×7): 81 mg via ORAL
  Filled 2015-06-17 (×7): qty 1

## 2015-06-17 NOTE — ED Notes (Signed)
[facility]:  [phone] Cell:  [phone]

## 2015-06-17 NOTE — H&P (Signed)
[facility] History and Physical  DEMONTRAE BEFORT Kelley:[id number] DOB: [birth year] DOA: 06/17/2015  Referring physician: Murray Hodgkins PCP: [name], MD   Chief Complaint: bilateral arm swelling/erythema/itching  HPI: Phillip Kelley is a 81 y.o. male past medical history that includes CHF, A. fib, status post PPM, chronic kidney disease stage III, history on DVT formally on Xarelto, gait ataxia with frequent falls presents to the emergency department with chief complaint of bilateral upper extremity cellulitis with itching. Initial evaluation reveals acute on chronic kidney disease, bilateral upper extremity swelling/erythema concerning for cellulitis.  Patient reports some chronic dryness and rash to his upper extremities but states this is worsened over the last week. He was evaluated by his PCP and provided with doxycycline with no improvement. Today he was advised to come to the emergency department for further evaluation. He denies any fever chills headache dizziness nausea vomiting. He denies any dysuria hematuria frequency or urgency. He does report worsening itching of his upper extremities but denies pain to the area. He does endorse worsening edema. He also complains of chronic constipation his last bowel movement was yesterday was normal color and consistency.  Workup in the emergency department [facility] basic metabolic panel significant for creatinine of 2.57, CBC with hemoglobin of 9.8, platelets 124. He is afebrile hemodynamically stable and not hypoxic.  He is provided with 600 mg of clindamycin and 0.5 mg of Ativan intravenously.   Review of Systems:  10 point review of systems complete and all systems are negative except as indicated in the history of present illness Past Medical History  Diagnosis Date  . Cataract   . Hypertension   . Chronic diastolic CHF (congestive heart failure) (HCC)     a. 09/2014 Echo: EF 65-70%, sev LVH, Gr 1 DD, mod dil LA.  Marland Kitchen Symptomatic  bradycardia     a. 01/2012 s/p Medtronic BiV PPM  . Anemia   . History of blood transfusion 2001    following hip surgery.  . Arthritis   . Chronic lower back pain   . Leg edema   . History of DVT (deep vein thrombosis)   . NICM (nonischemic cardiomyopathy) (HCC)     a. 09/2014 Echo: EF 65-70%.  . Gait instability   . ETOH abuse   . PAF (paroxysmal atrial fibrillation) (HCC)     a. No OAC 2/2 unsteady gait/falls.   Past Surgical History  Procedure Laterality Date  . Anterior fusion cervical spine  2001    for numbness in right arm, left with anhydrosis  . Total hip arthroplasty  2001    right; fell  . Carotid endarterectomy  ~ 2008    left  . Carpal tunnel release      bilaterally  . Insert / replace / remove pacemaker  01/25/12    Medtronic BiV PPM  . Tonsillectomy and adenoidectomy  ~ 1933  . Joint replacement    . Cataract extraction w/ intraocular lens implant  ~2011    left  . Ganglion cyst excision      ? left  . Bi-ventricular pacemaker insertion N/A 01/25/2012    Procedure: BI-VENTRICULAR PACEMAKER INSERTION (CRT-P);  Surgeon: Marinus Maw, MD;  Location: [facility] CATH LAB;  Service: Cardiovascular;  Laterality: N/A;   Social History:  reports that he quit smoking about 37 years ago. His smoking use included Cigarettes. He has a 35 pack-year smoking history. He has never used smokeless tobacco. He reports that he drinks about 12.6 oz of alcohol per  week. His drug history is not on file. Visit home with his wife he is is a walker has frequent falls Allergies  Allergen Reactions  . Oxycodone Other (See Comments)    Pt was VERY confused and didn't know where he was at  . Penicillins Rash  . Sulfa Antibiotics Rash  . Losartan Other (See Comments)    Hyperkalemia     Family History  Problem Relation Age of Onset  . Congestive Heart Failure Mother      Prior to Admission medications   Medication Sig Start Date End Date Taking? Authorizing Provider  amiodarone  (PACERONE) 200 MG tablet Take 100 mg by mouth daily. Patient taking by mouth one half tablet (100 mg) daily 08/19/14   Historical Provider, MD  aspirin EC 81 MG tablet Take 1 tablet (81 mg total) by mouth daily. 07/17/14   Beatrice Lecher, PA-C  Calcium Carbonate-Vitamin D (CALCIUM-VITAMIN D) 500-200 MG-UNIT per tablet Take 1 tablet by mouth daily.     Historical Provider, MD  furosemide (LASIX) 20 MG tablet TAKE 1 TABLET (20 MG TOTAL) BY MOUTH DAILY. 06/15/15   Marinus Maw, MD  hydrALAZINE (APRESOLINE) 25 MG tablet Take 25 mg by mouth 2 (two) times daily.    Historical Provider, MD  levothyroxine (SYNTHROID, LEVOTHROID) 50 MCG tablet Take 1 tablet by mouth daily. 11/19/14   Historical Provider, MD  Multiple Vitamin (MULTIVITAMIN) tablet Take 1 tablet by mouth daily.    Historical Provider, MD  mupirocin ointment (BACTROBAN) 2 % Apply 1 application topically daily as needed (itching).  09/04/13   Historical Provider, MD  Tamsulosin HCl (FLOMAX) 0.4 MG CAPS Take 0.4 mg by mouth 2 (two) times daily.     Historical Provider, MD   Physical Exam: Filed Vitals:   06/17/15 1134 06/17/15 1327 06/17/15 1444  BP: 146/83  167/62  Pulse: 69  71  Temp: 97.4 F (36.3 C) 97.2 F (36.2 C)   TempSrc: Oral Rectal   Resp: 18  16  Weight: 67.132 kg (148 lb)    SpO2: 96%  98%    Wt Readings from Last 3 Encounters:  06/17/15 67.132 kg (148 lb)  10/28/14 67.132 kg (148 lb)  08/19/14 66.769 kg (147 lb 3.2 oz)    General:  Appears calm and comfortable, scratching arms Eyes: PERRL, normal lids, irises & conjunctiva ENT: grossly normal hearing, his membranes of his mouth are pink slightly dry Neck: no LAD, masses or thyromegaly Cardiovascular: Irregularly irregular no m/r/g. No LE edema. Telemetry: SR, no arrhythmias  Respiratory: CTA bilaterally, no w/r/r. Normal respiratory effort. Abdomen: soft, ntnd + bowel sounds no guarding or rebounding Skin: Bilateral upper extremities with dark erythema and edema to  just above the elbows resembles venous stasis changes also seen in lower extremities. Also scaly dry. Rash to low back extending to mid thoracic area also noted less dryness on this area. Areas nontender to palpation Musculoskeletal: grossly normal tone BUE/BLE Psychiatric: grossly normal mood and affect, speech fluent and appropriate Neurologic: grossly non-focal. speech clear facial symmetry           Labs on Admission:  Basic Metabolic Panel:  Recent Labs Lab 06/17/15 1315  NA 143  K 4.1  CL 109  CO2 26  GLUCOSE 83  BUN 37*  CREATININE 2.57*  CALCIUM 9.4   Liver Function Tests: No results for input(s): AST, ALT, ALKPHOS, BILITOT, PROT, ALBUMIN in the last 168 hours. No results for input(s): LIPASE, AMYLASE in the last 168  hours. No results for input(s): AMMONIA in the last 168 hours. CBC:  Recent Labs Lab 06/17/15 1315  WBC 10.1  NEUTROABS 6.8  HGB 9.8*  HCT 29.6*  MCV 95.8  PLT 124*   Cardiac Enzymes: No results for input(s): CKTOTAL, CKMB, CKMBINDEX, TROPONINI in the last 168 hours.  BNP (last 3 results)  Recent Labs  12/05/14 1437  BNP 697.0*    ProBNP (last 3 results) No results for input(s): PROBNP in the last 8760 hours.   Creatinine clearance cannot be calculated (Unknown ideal weight.)  CBG: No results for input(s): GLUCAP in the last 168 hours.  Radiological Exams on Admission: No results found.  EKG:   Assessment/Plan Principal Problem:   Acute renal failure superimposed on stage 3 chronic kidney disease (HCC) Active Problems:   Gait abnormality   Constipation   Chronic systolic heart failure (HCC)   Pacemaker-BiV Medtronic   Atrial fibrillation (HCC)   Alcohol abuse   Normocytic anemia   ETOH abuse   Hypertension   Cellulitis   Itching  #1. Acute renal failure superimposed on stage III chronic kidney disease. May be related to decreased oral intake in the setting of diuresis  -Chart review indicates baseline creatinine  range 1.7-1.8. Current creatinine 2.5. -Will admit and gently hydrate -Hold nephrotoxins -Monitor urine output -No improvement consider renal ultrasound  #2. Bilateral upper extremity cellulitis/itching. Has chronic rash and dryness but worsening over the last 3 days. He is afebrile no leukocytosis nontoxic appearing -Admit and continue clindamycin initiated in the emergency department -Atarax for itching -Consider wound consult  #3. A. fib. Last pacemaker check in May. Currently rate controlled. Not on anticoagulation due to frequent falls -Monitor on telemetry -Continue home medications  #4. Chronic combination heart failure. Last echo 2014 showed an EF of 55% grade 1 diastolic dysfunction. Followed by Dr. [name]. -Currently compensated -We'll hold Lasix as noted above -Very gentle IV fluids -Monitor intake and output obtain at daily weight -Continue amiodarone  #5. Hypertension. Controlled. Home medications include Lasix and hydralazine. -Hold Lasix as noted above -Continue hydralazine -Monitor  #6. EtOH abuse. Reports at least 2 cocktails 1 glass of wine daily -Denies any history of withdrawal -Will use CIWA -Monitor closely  #7. Normocytic anemia. -Current hemoglobin appears only slightly less than baseline -May be concentrated [facility] recheck tomorrow after gentle hydration -No obvious signs of bleeding -History of same -Home meds include vitamin with iron  #8. Constipation. Chronic -Bowel regimen. Last BM yesterday  #9. Gait abnormality. History of same reports frequent falls. Uses a walker -Physical therapy evaluation  Code Status: full DVT Prophylaxis: Family Communication: none present Disposition Plan: home  Time spent: 70 minutes  [facility]

## 2015-06-17 NOTE — ED Provider Notes (Signed)
CSN: [id number]     Arrival date & time 06/17/15  1115 History   First MD Initiated Contact with Patient 06/17/15 1258     Chief Complaint  Patient presents with  . Cellulitis     (Consider location/radiation/quality/duration/timing/severity/associated sxs/prior Treatment) HPI Comments: Patient is an 81 year old male with a past medical history of CHF, atrial fibrillation, CKD, and hypertension who presents with bilateral arm pain that started 2 days ago. The pain is throbbing and severe without radiation. Palpation makes the pain worse. No alleviating factors. Patient reports a chronic dry rash on bilateral arms that acutely worsened 2 days ago. He reports associated pain and swelling to the area. Patient was started on doxycycline by his PCP 2 days ago and the arm pain has worsened. No other symptoms.    Past Medical History  Diagnosis Date  . Cataract   . Hypertension   . Chronic diastolic CHF (congestive heart failure) (HCC)     a. 09/2014 Echo: EF 65-70%, sev LVH, Gr 1 DD, mod dil LA.  Marland Kitchen Symptomatic bradycardia     a. 01/2012 s/p Medtronic BiV PPM  . Anemia   . History of blood transfusion 2001    following hip surgery.  . Arthritis   . Chronic lower back pain   . Leg edema   . History of DVT (deep vein thrombosis)   . NICM (nonischemic cardiomyopathy) (HCC)     a. 09/2014 Echo: EF 65-70%.  . Gait instability   . ETOH abuse   . PAF (paroxysmal atrial fibrillation) (HCC)     a. No OAC 2/2 unsteady gait/falls.   Past Surgical History  Procedure Laterality Date  . Anterior fusion cervical spine  2001    for numbness in right arm, left with anhydrosis  . Total hip arthroplasty  2001    right; fell  . Carotid endarterectomy  ~ 2008    left  . Carpal tunnel release      bilaterally  . Insert / replace / remove pacemaker  01/25/12    Medtronic BiV PPM  . Tonsillectomy and adenoidectomy  ~ 1933  . Joint replacement    . Cataract extraction w/ intraocular lens implant  ~2011     left  . Ganglion cyst excision      ? left  . Bi-ventricular pacemaker insertion N/A 01/25/2012    Procedure: BI-VENTRICULAR PACEMAKER INSERTION (CRT-P);  Surgeon: Marinus Maw, MD;  Location: [facility] CATH LAB;  Service: Cardiovascular;  Laterality: N/A;   Family History  Problem Relation Age of Onset  . Congestive Heart Failure Mother    Social History  Substance Use Topics  . Smoking status: Former Smoker -- 1.00 packs/day for 35 years    Types: Cigarettes    Quit date: 08/21/1977  . Smokeless tobacco: Never Used  . Alcohol Use: 12.6 oz/week    14 Glasses of wine, 7 Shots of liquor per week     Comment: As much as possible    Review of Systems  Skin: Positive for rash and wound.  All other systems reviewed and are negative.     Allergies  Oxycodone; Penicillins; Sulfa antibiotics; and Losartan  Home Medications   Prior to Admission medications   Medication Sig Start Date End Date Taking? Authorizing Provider  amiodarone (PACERONE) 200 MG tablet Take 100 mg by mouth daily. Patient taking by mouth one half tablet (100 mg) daily 08/19/14   Historical Provider, MD  aspirin EC 81 MG tablet Take 1  tablet (81 mg total) by mouth daily. 07/17/14   Beatrice Lecher, PA-C  Calcium Carbonate-Vitamin D (CALCIUM-VITAMIN D) 500-200 MG-UNIT per tablet Take 1 tablet by mouth daily.     Historical Provider, MD  furosemide (LASIX) 20 MG tablet TAKE 1 TABLET (20 MG TOTAL) BY MOUTH DAILY. 06/15/15   Marinus Maw, MD  hydrALAZINE (APRESOLINE) 25 MG tablet Take 25 mg by mouth 2 (two) times daily.    Historical Provider, MD  levothyroxine (SYNTHROID, LEVOTHROID) 50 MCG tablet Take 1 tablet by mouth daily. 11/19/14   Historical Provider, MD  Multiple Vitamin (MULTIVITAMIN) tablet Take 1 tablet by mouth daily.    Historical Provider, MD  mupirocin ointment (BACTROBAN) 2 % Apply 1 application topically daily as needed (itching).  09/04/13   Historical Provider, MD  Tamsulosin HCl (FLOMAX) 0.4 MG CAPS  Take 0.4 mg by mouth 2 (two) times daily.     Historical Provider, MD   BP 146/83 mmHg  Pulse 69  Temp(Src) 97.4 F (36.3 C) (Oral)  Resp 18  Wt 67.132 kg  SpO2 96% Physical Exam  Constitutional: He is oriented to person, place, and time. He appears well-developed and well-nourished. No distress.  HENT:  Head: Normocephalic and atraumatic.  Eyes: Conjunctivae and EOM are normal.  Neck: Normal range of motion.  Cardiovascular: Normal rate and regular rhythm.  Exam reveals no gallop and no friction rub.   No murmur heard. Pulmonary/Chest: Effort normal and breath sounds normal. He has no wheezes. He has no rales. He exhibits no tenderness.  Abdominal: Soft. He exhibits no distension. There is no tenderness. There is no rebound.  Musculoskeletal: Normal range of motion.  Neurological: He is alert and oriented to person, place, and time. Coordination normal.  Speech is goal-oriented. Moves limbs without ataxia.   Skin: Skin is warm and dry. Rash noted. There is erythema.  Well demarcated erythema of bilateral arms that stops just distal to the patient's shoulders. The skin is dry and flaking and edematous (left>right). The arms are tender to palpation. There are similar skin changes of the patient's low back that extend up to the thoracic back area.   Psychiatric: He has a normal mood and affect. His behavior is normal.  Nursing note and vitals reviewed.   ED Course  Procedures (including critical care time) Labs Review Labs Reviewed  CBC WITH DIFFERENTIAL/PLATELET - Abnormal; Notable for the following:    RBC 3.09 (*)    Hemoglobin 9.8 (*)    HCT 29.6 (*)    RDW 16.4 (*)    Platelets 124 (*)    Monocytes Absolute 1.3 (*)    Eosinophils Absolute 0.9 (*)    All other components within normal limits  BASIC METABOLIC PANEL - Abnormal; Notable for the following:    BUN 37 (*)    Creatinine, Ser 2.57 (*)    GFR calc non Af Amer 21 (*)    GFR calc Af Amer 24 (*)    All other  components within normal limits    Imaging Review No results found. I have personally reviewed and evaluated these images and lab results as part of my medical decision-making.   EKG Interpretation None      MDM   Final diagnoses:  Cellulitis of upper extremity, unspecified laterality    1:07 PM Labs pending. Patient failed outpatient treatment of cellulitis. Vitals stable and patient afebrile. Patient sent here for IV antibiotics.    Emilia Beck, PA-C 06/17/15 1519  Raeford Razor,  MD 06/20/15 2057

## 2015-06-17 NOTE — ED Notes (Signed)
Pt given turkey sandwich and gingerale. 

## 2015-06-17 NOTE — ED Notes (Signed)
Meal tray ordered 

## 2015-06-17 NOTE — ED Notes (Signed)
Reports he was sent over here for treatment of cellulitis to bilateral forearms for a couple of days. Pt with blood noted to the area.

## 2015-06-17 NOTE — ED Notes (Signed)
Admitting MD at bedside.

## 2015-06-18 DIAGNOSIS — R269 Unspecified abnormalities of gait and mobility: Secondary | ICD-10-CM

## 2015-06-18 DIAGNOSIS — I5022 Chronic systolic (congestive) heart failure: Secondary | ICD-10-CM

## 2015-06-18 DIAGNOSIS — R21 Rash and other nonspecific skin eruption: Secondary | ICD-10-CM

## 2015-06-18 DIAGNOSIS — I4891 Unspecified atrial fibrillation: Secondary | ICD-10-CM

## 2015-06-18 LAB — BASIC METABOLIC PANEL
ANION GAP: 8 (ref 5–15)
BUN: 38 mg/dL — ABNORMAL HIGH (ref 6–20)
CHLORIDE: 111 mmol/L (ref 101–111)
CO2: 24 mmol/L (ref 22–32)
Calcium: 8.7 mg/dL — ABNORMAL LOW (ref 8.9–10.3)
Creatinine, Ser: 2.42 mg/dL — ABNORMAL HIGH (ref 0.61–1.24)
GFR, EST AFRICAN AMERICAN: 26 mL/min — AB (ref >60)
GFR, EST NON AFRICAN AMERICAN: 23 mL/min — AB (ref >60)
Glucose, Bld: 74 mg/dL (ref 65–99)
POTASSIUM: 4 mmol/L (ref 3.5–5.1)
SODIUM: 143 mmol/L (ref 135–145)

## 2015-06-18 LAB — CBC
HCT: 25.3 % — ABNORMAL LOW (ref 39.0–52.0)
HEMOGLOBIN: 8.3 g/dL — AB (ref 13.0–17.0)
MCH: 31.1 pg (ref 26.0–34.0)
MCHC: 32.8 g/dL (ref 30.0–36.0)
MCV: 94.8 fL (ref 78.0–100.0)
PLATELETS: 106 10*3/uL — AB (ref 150–400)
RBC: 2.67 MIL/uL — AB (ref 4.22–5.81)
RDW: 16.3 % — ABNORMAL HIGH (ref 11.5–15.5)
WBC: 8.1 10*3/uL (ref 4.0–10.5)

## 2015-06-18 LAB — SEDIMENTATION RATE: SED RATE: 5 mm/h (ref 0–16)

## 2015-06-18 LAB — HIV ANTIBODY (ROUTINE TESTING W REFLEX): HIV SCREEN 4TH GENERATION: NONREACTIVE

## 2015-06-18 LAB — RPR: RPR: NONREACTIVE

## 2015-06-18 MED ORDER — GUAIFENESIN ER 600 MG PO TB12
600.0000 mg | ORAL_TABLET | Freq: Two times a day (BID) | ORAL | Status: DC
  Administered 2015-06-18 – 2015-06-24 (×13): 600 mg via ORAL
  Filled 2015-06-18 (×13): qty 1

## 2015-06-18 MED ORDER — SODIUM CHLORIDE 0.9 % IV BOLUS (SEPSIS)
500.0000 mL | Freq: Once | INTRAVENOUS | Status: AC
  Administered 2015-06-18: 500 mL via INTRAVENOUS

## 2015-06-18 MED ORDER — TRIAMCINOLONE 0.1 % CREAM:EUCERIN CREAM 1:1
TOPICAL_CREAM | Freq: Two times a day (BID) | CUTANEOUS | Status: DC
  Administered 2015-06-18: 13:00:00 via TOPICAL
  Administered 2015-06-18: 1 via TOPICAL
  Administered 2015-06-19 – 2015-06-22 (×7): via TOPICAL
  Administered 2015-06-22: 1 via TOPICAL
  Administered 2015-06-23 – 2015-06-24 (×3): via TOPICAL
  Filled 2015-06-18 (×2): qty 1

## 2015-06-18 NOTE — Progress Notes (Signed)
[facility] Progress Note   Phillip Kelley  ENM:[id number]  DOB: 1927/04/11  DOA: 06/17/2015 PCP: [name], MD  Brief narrative: Phillip Kelley is a 81 y.o. 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 He was started on a course of doxycycline by his PCP however there was no improvement noted and he was therefore asked to come to the hospital. In the ER he was noted to have acute on chronic renal failure and he was therefore referred for admission.   Subjective: Mild pain in both arms but mostly has itching in his arms and back. No complaints of shortness of breath chest pain or tachycardia.    Assessment/Plan: Principal Problem:   Acute renal failure superimposed on stage 3 chronic kidney disease -Suspected to be volume depleted from being on furosemide has been discontinued -IV fluids given overnight but urine output very poor -Obtain urine sodium, urine creatinine and urine osmolality  Active Problems: Rash on arms and legs - was recently taking Doxycycline  - cont oral steroids, Vistaril, clilndamycin and Diflucan- contacting dermatology for opinion    Gait abnormality/ frequent falls - PT eval - will need SNF    Chronic diastolic heart failure  - repeating ECHO- dehydrated- holding Lasix    Pacemaker-BiV Medtronic    Atrial fibrillation  - cont Amiodarone and Aspirin- not candidate for anticoagulation due to falls    Alcohol abuse - follow for withdrawal    Hypertension - cont Hydralazine  BPH - cont Flomax  Code Status:     Code Status Orders        Start     Ordered   06/17/15 1657  Full code   Continuous     06/17/15 1656    Advance Directive Documentation    Most Recent Value   Type of Advance Directive  Healthcare Power of Attorney Mariane Baumgarten (daughter)]   Pre-existing out of facility DNR order (yellow form or pink MOST form)     "MOST" Form in Place?       Family Communication:   Disposition Plan: SNF search DVT prophylaxis: SCDs Consultants: Procedures:   Antibiotics: Anti-infectives    Start     Dose/Rate Route Frequency Ordered Stop   06/17/15 2100  fluconazole (DIFLUCAN) tablet 150 mg  Status:  Discontinued     150 mg Oral Daily 06/17/15 2048 06/17/15 2056   06/17/15 2100  fluconazole (DIFLUCAN) tablet 50 mg     50 mg Oral Every 24 hours 06/17/15 2057 06/24/15 2059   06/17/15 2000  clindamycin (CLEOCIN) capsule 300 mg     300 mg Oral 4 times per day 06/17/15 1656 06/22/15 1759   06/17/15 1430  clindamycin (CLEOCIN) IVPB 600 mg     600 mg 100 mL/hr over 30 Minutes Intravenous  Once 06/17/15 1428 06/17/15 1608      Objective: Filed Weights   06/17/15 1134 06/17/15 1630 06/18/15 0541  Weight: 67.132 kg (148 lb) 67.132 kg (148 lb) 67.9 kg (149 lb 11.1 oz)    Intake/Output Summary (Last 24 hours) at 06/18/15 1323 Last data filed at 06/18/15 0626  Gross per 24 hour  Intake 1133.75 ml  Output    150 ml  Net 983.75 ml     Vitals  Filed Vitals:   06/17/15 1630 06/17/15 2142 06/18/15 0015 06/18/15 0541  BP: 117/91 155/61 142/55 146/72  Pulse: 70 70 70 70  Temp:  97.5 F (36.4 C) 97.7 F (36.5 C) 98.2 F (36.8 C)  TempSrc:  Oral Oral Oral  Resp: Weight: 67.132 kg (148 lb)   67.9 kg (149 lb 11.1 oz)  SpO2: 96% 100% 98% 96%    Exam:  General:  Pt is alert, not in acute distress  HEENT: No icterus, No thrush, oral mucosa moist  Cardiovascular: regular rate and rhythm, S1/S2 No murmur  Respiratory: clear to auscultation bilaterally   Abdomen: Soft, +Bowel sounds, non tender, non distended, no guarding  MSK: No LE edema, cyanosis or clubbing  Skin: rash with bruising in some areas on arms and  back with flaky dry skin- scattered areas of broken skin and bleeding           Data Reviewed: Basic Metabolic Panel:  Recent Labs Lab 06/17/15 1315 06/18/15 0547  NA 143 143  K 4.1 4.0  CL 109 111  CO2 26 24  GLUCOSE 83 74  BUN 37* 38*  CREATININE 2.57* 2.42*  CALCIUM 9.4 8.7*   Liver Function Tests: No results for input(s): AST, ALT, ALKPHOS, BILITOT, PROT, ALBUMIN in the last 168 hours. No results for input(s): LIPASE, AMYLASE in the last 168 hours. No results for input(s): AMMONIA in the last 168 hours. CBC:  Recent Labs Lab 06/17/15 1315 06/18/15 0547  WBC 10.1 8.1  NEUTROABS 6.8  --   HGB 9.8* 8.3*  HCT 29.6* 25.3*  MCV 95.8 94.8  PLT 124* 106*   Cardiac Enzymes: No results for input(s): CKTOTAL, CKMB, CKMBINDEX, TROPONINI in the last 168 hours. BNP (last 3 results)  Recent Labs  12/05/14 1437  BNP 697.0*    ProBNP (last 3 results) No results for input(s): PROBNP in the last 8760 hours.  CBG: No results for input(s): GLUCAP in the last 168 hours.  No results found for this or any previous visit (from the past 240 hour(s)).   Studies: No results found.  Scheduled Meds:  Scheduled Meds: . amiodarone  100 mg Oral Daily  . aspirin EC  81 mg Oral Daily  . clindamycin  300 mg Oral 4 times per day  . docusate sodium  100 mg Oral BID  . fluconazole  50 mg Oral Q24H  . folic acid  1 mg Oral Daily  . hydrALAZINE  25 mg Oral BID  . levothyroxine  50 mcg Oral QAC breakfast  . multivitamin with minerals  1 tablet Oral Daily  . tamsulosin  0.4 mg Oral BID  . thiamine  100 mg Oral Daily  . triamcinolone 0.1 % cream : eucerin   Topical BID   Continuous Infusions:   Time spent on care of this patient: 35 min   Torin Whisner, MD 06/18/2015, 1:23 PM  LOS: 1 day   [facility] Office  [phone] Pager - Text Page per [email or web address] If 7PM-7AM, please contact night-coverage [email or web address]

## 2015-06-18 NOTE — Evaluation (Signed)
Physical Therapy Evaluation Patient Details Name: Phillip Kelley MRN: [id number] DOB: 1926/10/24 Today's Date: 06/18/2015   History of Present Illness  Pt adm with acute on chronic renal failure and bilateral arm and back rash. PMH - CHF, HTN, etoh abuse, pacer, afib  Clinical Impression  Pt admitted with above diagnosis and presents to PT with functional limitations due to deficits listed below (See PT problem list). Pt needs skilled PT to maximize independence and safety to allow discharge to ST-SNF. Do not feel pt can manage adequately at home at this level. Pt estimates falls ~1x per week. When asked how he gets up from fall he reports he presses life alert button and emergency responders come and get him off the floor.      Follow Up Recommendations SNF    Equipment Recommendations  None recommended by PT    Recommendations for Other Services OT consult     Precautions / Restrictions Precautions Precautions: Fall Restrictions Weight Bearing Restrictions: No      Mobility  Bed Mobility Overal bed mobility: Needs Assistance Bed Mobility: Sidelying to Sit   Sidelying to sit: Min assist;HOB elevated       General bed mobility comments: Assist to bring shoulders up  Transfers Overall transfer level: Needs assistance Equipment used: 4-wheeled walker Transfers: Sit to/from Stand Sit to Stand: Min assist         General transfer comment: Assist to bring hips up and for balance  Ambulation/Gait Ambulation/Gait assistance: Min assist;Mod assist Ambulation Distance (Feet): 10 Feet Assistive device: 4-wheeled walker Gait Pattern/deviations: Step-to pattern;Decreased step length - right;Decreased step length - left;Shuffle;Trunk flexed Gait velocity: decr Gait velocity interpretation: Below normal speed for age/gender General Gait Details: Assist for balance. Verbal cues to stand more erect. Pt with flexed knees throughout  Stairs            Wheelchair  Mobility    Modified Rankin (Stroke Patients Only)       Balance Overall balance assessment: Needs assistance Sitting-balance support: No upper extremity supported;Feet supported Sitting balance-Leahy Scale: Fair     Standing balance support: Bilateral upper extremity supported Standing balance-Leahy Scale: Poor Standing balance comment: walker and min A for static standing.                             Pertinent Vitals/Pain Pain Assessment: No/denies pain    Home Living Family/patient expects to be discharged to:: Private residence Living Arrangements: Spouse/significant other Available Help at Discharge: Family;Personal care attendant Type of Home: House Home Access: Stairs to enter Entrance Stairs-Rails: Doctor, general practice of Steps: several Home Layout: One level Home Equipment: Environmental consultant - 2 wheels;Tub bench;Toilet riser;Grab bars - toilet;Grab bars - tub/shower;Cane - single point;Walker - 4 wheels Additional Comments: Pt falls frequently and uses lifeline to summon emergency responders to get him off the floor after a fall.    Prior Function Level of Independence: Needs assistance   Gait / Transfers Assistance Needed: Amb with walker but falls frequently (~1x per week)           Hand Dominance        Extremity/Trunk Assessment   Upper Extremity Assessment: RUE deficits/detail;LUE deficits/detail RUE Deficits / Details: Severe shoulder ROM limitations with only ~30 degrees of active flexion     LUE Deficits / Details: Severe shoulder ROM limitations with only ~30 degrees of active flexion   Lower Extremity Assessment: RLE deficits/detail;LLE deficits/detail RLE Deficits /  Details: Hips and ankles grossly 2+/ 5, knee grossly 3- to 3/5 LLE Deficits / Details: Hips and ankles grossly 2+/ 5, knee grossly 3- to 3/5     Communication      Cognition Arousal/Alertness: Awake/alert Behavior During Therapy: WFL for tasks  assessed/performed Overall Cognitive Status: No family/caregiver present to determine baseline cognitive functioning                      General Comments      Exercises        Assessment/Plan    PT Assessment Patient needs continued PT services  PT Diagnosis Difficulty walking;Generalized weakness   PT Problem List Decreased strength;Decreased range of motion;Decreased activity tolerance;Decreased balance;Decreased mobility;Decreased knowledge of use of DME  PT Treatment Interventions DME instruction;Gait training;Functional mobility training;Therapeutic activities;Therapeutic exercise;Balance training;Patient/family education   PT Goals (Current goals can be found in the Care Plan section) Acute Rehab PT Goals Patient Stated Goal: return home PT Goal Formulation: With patient Time For Goal Achievement: 07/02/15 Potential to Achieve Goals: Fair    Frequency Min 3X/week   Barriers to discharge        Co-evaluation               End of Session Equipment Utilized During Treatment: Gait belt Activity Tolerance: Patient limited by fatigue Patient left: in chair;with call bell/phone within reach;with chair alarm set Nurse Communication: Mobility status         Time: 1191-4782 PT Time Calculation (min) (ACUTE ONLY): 29 min   Charges:   PT Evaluation $Initial PT Evaluation Tier I: 1 Procedure PT Treatments $Gait Training: 8-22 mins   PT G Codes:        Anyela Napierkowski 06/28/15, 2:17 PM [facility] PT [phone]

## 2015-06-18 NOTE — Progress Notes (Signed)
CSW received referral for possible SNF placement at time of discharge. CSW met with patient at bedside regarding PT recommendation of SNF placement at time of discharge. Patient expressed understanding of PT recommendation but is not agreeable to SNF placement. Patient stated he wanted to stay at home continuing with home health PT. [place] alerted RNCM, Neoma Laming, for home health PT.  CSW signing off.  Phillip Kelley LCSWA [phone]

## 2015-06-18 NOTE — Care Management Note (Addendum)
Case Management Note  Patient Details  Name: BENJIMIN STROUGH MRN: [id number] Date of Birth: 11/15/1926  Subjective/Objective:    Date: 06/18/15 Spoke with patient at the bedside.  Introduced self as Sports coach and explained role in discharge planning and how to be reached.  Verified patient lives in town, alone with spouse. Has DME rolling walker and shower chair.  Expressed potential need for no other DME.  Verified patient anticipates to go home with family, refusing snf,  at time of discharge and will have full-time supervision by family at this time to best of their knowledge.  Patient denied needing help with their medication.  Patient  is driven by wife to MD appointments.  Verified patient has PCP Cyndia Bent. Patient is active with [facility], PT, aide , will add Child psychotherapist.  Referral made to [facility] with [facility] to resume.    Plan: CM will continue to follow for discharge planning and [facility] resources.                 Action/Plan:   Expected Discharge Date:                  Expected Discharge Plan:  Home w Home Health Services  In-House Referral:     Discharge planning Services  CM Consult  Post Acute Care Choice:  Resumption of Svcs/PTA Provider Choice offered to:  Patient  DME Arranged:    DME Agency:     HH Arranged:  RN, PT, Nurse's Aide, OT, Social Work [employer] Agency:     Status of Service:     [employer] Given:    Date Medicare IM Given:    Medicare IM give by:    Date Additional Medicare IM Given:    Additional Medicare Important Message give by:     If discussed at Long Length of Stay Meetings, dates discussed:    Additional Comments:  Leone Haven, RN 06/18/2015, 4:30 PM

## 2015-06-19 ENCOUNTER — Inpatient Hospital Stay (HOSPITAL_COMMUNITY): Payer: Medicare Other

## 2015-06-19 DIAGNOSIS — I509 Heart failure, unspecified: Secondary | ICD-10-CM

## 2015-06-19 DIAGNOSIS — I10 Essential (primary) hypertension: Secondary | ICD-10-CM

## 2015-06-19 LAB — URINALYSIS, ROUTINE W REFLEX MICROSCOPIC
Bilirubin Urine: NEGATIVE
GLUCOSE, UA: NEGATIVE mg/dL
Hgb urine dipstick: NEGATIVE
Ketones, ur: NEGATIVE mg/dL
LEUKOCYTES UA: NEGATIVE
Nitrite: NEGATIVE
PH: 7.5 (ref 5.0–8.0)
Protein, ur: 30 mg/dL — AB
SPECIFIC GRAVITY, URINE: 1.016 (ref 1.005–1.030)

## 2015-06-19 LAB — OSMOLALITY, URINE: Osmolality, Ur: 532 mOsm/kg (ref 300–900)

## 2015-06-19 LAB — BASIC METABOLIC PANEL
Anion gap: 9 (ref 5–15)
BUN: 34 mg/dL — ABNORMAL HIGH (ref 6–20)
CALCIUM: 8.9 mg/dL (ref 8.9–10.3)
CHLORIDE: 111 mmol/L (ref 101–111)
CO2: 23 mmol/L (ref 22–32)
CREATININE: 2.12 mg/dL — AB (ref 0.61–1.24)
GFR calc non Af Amer: 27 mL/min — ABNORMAL LOW (ref >60)
GFR, EST AFRICAN AMERICAN: 31 mL/min — AB (ref >60)
GLUCOSE: 91 mg/dL (ref 65–99)
Potassium: 4.8 mmol/L (ref 3.5–5.1)
SODIUM: 143 mmol/L (ref 135–145)

## 2015-06-19 LAB — CBC
HCT: 26.9 % — ABNORMAL LOW (ref 39.0–52.0)
HEMOGLOBIN: 8.9 g/dL — AB (ref 13.0–17.0)
MCH: 31.1 pg (ref 26.0–34.0)
MCHC: 33.1 g/dL (ref 30.0–36.0)
MCV: 94.1 fL (ref 78.0–100.0)
PLATELETS: 109 10*3/uL — AB (ref 150–400)
RBC: 2.86 MIL/uL — AB (ref 4.22–5.81)
RDW: 16 % — ABNORMAL HIGH (ref 11.5–15.5)
WBC: 10.4 10*3/uL (ref 4.0–10.5)

## 2015-06-19 LAB — URINE MICROSCOPIC-ADD ON

## 2015-06-19 LAB — SODIUM, URINE, RANDOM: Sodium, Ur: 124 mmol/L

## 2015-06-19 LAB — CREATININE, URINE, RANDOM: CREATININE, URINE: 81.03 mg/dL

## 2015-06-19 MED ORDER — POLYETHYLENE GLYCOL 3350 17 G PO PACK
17.0000 g | PACK | Freq: Every day | ORAL | Status: DC | PRN
  Administered 2015-06-19 – 2015-06-23 (×2): 17 g via ORAL
  Filled 2015-06-19 (×2): qty 1

## 2015-06-19 MED ORDER — PREDNISONE 50 MG PO TABS
50.0000 mg | ORAL_TABLET | Freq: Every day | ORAL | Status: DC
  Administered 2015-06-19 – 2015-06-22 (×4): 50 mg via ORAL
  Filled 2015-06-19 (×4): qty 1

## 2015-06-19 NOTE — [place] FL2 (Signed)
[place] MEDICAID FL2 LEVEL OF CARE SCREENING TOOL     IDENTIFICATION  Patient Name: Phillip Kelley Birthdate: 09-23-1926 Sex: male Admission Date (Current Location): 06/17/2015  [place] and [place] Number: [facility] and Address:  [facility] [place], [address] [place], [place] [address]      Provider Number: [id number]  Attending Physician Name and Address:  Calvert Cantor, MD  Relative Name and Phone Number:  Tillmon Kisling, Wife, [phone]    Current Level of Care: Hospital Recommended Level of Care: Skilled Nursing Facility Prior Approval Number:    Date Approved/Denied:   PASRR Number: [id number]  Discharge Plan: SNF    Current Diagnoses: Patient Active Problem List   Diagnosis Date Noted  . Rash and nonspecific skin eruption 06/18/2015  . Cellulitis 06/17/2015  . Itching 06/17/2015  . Acute renal failure superimposed on stage 3 chronic kidney disease (HCC) 06/17/2015  . PAF (paroxysmal atrial fibrillation) (HCC)   . ETOH abuse   . Chronic diastolic CHF (congestive heart failure) (HCC)   . Hypertension   . Protein-calorie malnutrition, severe (HCC) 07/09/2014  . Fall   . Bleeding 07/08/2014  . Alcohol abuse 07/08/2014  . CKD (chronic kidney disease) stage 3, GFR 30-59 ml/min 07/08/2014  . Normocytic anemia 07/08/2014  . Chronic diastolic heart failure (HCC)   . Atrial fibrillation (HCC) 05/07/2014  . Other primary cardiomyopathies 03/12/2012  . Pacemaker-BiV Medtronic 01/26/2012  . Chronic systolic heart failure (HCC) 12/21/2011  . Occlusion and stenosis of carotid artery without mention of cerebral infarction 10/21/2011  . Gait abnormality 03/31/2011  . Unspecified vitamin D deficiency 03/31/2011  . Constipation 03/31/2011  . Low back pain 03/31/2011  . Balance problems 03/31/2011    Orientation ACTIVITIES/SOCIAL BLADDER RESPIRATION    Self, Time, Situation, Place  Family supportive Continent Normal   BEHAVIORAL SYMPTOMS/MOOD NEUROLOGICAL BOWEL NUTRITION STATUS      Continent  (See DC Summary)  PHYSICIAN VISITS COMMUNICATION OF NEEDS Height & Weight Skin    Verbally   150 lbs. Normal          AMBULATORY STATUS RESPIRATION    Assist extensive Normal      Personal Care Assistance Level of Assistance  Bathing, Feeding, Dressing Bathing Assistance: Maximum assistance Feeding assistance: Maximum assistance Dressing Assistance: Maximum assistance      Functional Limitations Info  Sight Sight Info: Impaired           SPECIAL CARE FACTORS FREQUENCY  PT (By licensed PT)     PT Frequency: 5x/week             Additional Factors Info  Code Status, Allergies Code Status Info: Full Allergies Info: Oxycodone, Penicillins, Sulfa Antibiotics, Losartan           Current Medications (06/19/2015):  This is the current hospital active medication list Current Facility-Administered Medications  Medication Dose Route Frequency Provider Last Rate Last Dose  . acetaminophen (TYLENOL) tablet 650 mg  650 mg Oral Q6H PRN Gwenyth Bender, NP       Or  . acetaminophen (TYLENOL) suppository 650 mg  650 mg Rectal Q6H PRN Gwenyth Bender, NP      . amiodarone (PACERONE) tablet 100 mg  100 mg Oral Daily Lesle Chris Black, NP   100 mg at 06/19/15 0912  . aspirin EC tablet 81 mg  81 mg Oral Daily Gwenyth Bender, NP   81 mg at 06/19/15 0913  . bisacodyl (DULCOLAX) suppository 10  mg  10 mg Rectal Daily PRN Gwenyth Bender, NP      . clindamycin (CLEOCIN) capsule 300 mg  300 mg Oral 4 times per day Gwenyth Bender, NP   300 mg at 06/19/15 1134  . docusate sodium (COLACE) capsule 100 mg  100 mg Oral BID Gwenyth Bender, NP   100 mg at 06/19/15 8453  . folic acid (FOLVITE) tablet 1 mg  1 mg Oral Daily Gwenyth Bender, NP   1 mg at 06/19/15 0913  . guaiFENesin (MUCINEX) 12 hr tablet 600 mg  600 mg Oral BID Calvert Cantor, MD   600 mg at 06/19/15 0912  . hydrALAZINE (APRESOLINE) tablet 25 mg  25 mg Oral BID Gwenyth Bender, NP   25 mg at 06/19/15 0912  . HYDROmorphone (DILAUDID) injection 0.5 mg  0.5 mg Intravenous Q2H PRN Lesle Chris Black, NP      . hydrOXYzine (ATARAX/VISTARIL) tablet 25 mg  25 mg Oral TID PRN Gwenyth Bender, NP      . levothyroxine (SYNTHROID, LEVOTHROID) tablet 50 mcg  50 mcg Oral QAC breakfast Gwenyth Bender, NP   50 mcg at 06/19/15 0912  . LORazepam (ATIVAN) tablet 1 mg  1 mg Oral Q6H PRN Gwenyth Bender, NP       Or  . LORazepam (ATIVAN) injection 1 mg  1 mg Intravenous Q6H PRN Gwenyth Bender, NP      . magnesium citrate solution 1 Bottle  1 Bottle Oral Once PRN Gwenyth Bender, NP      . multivitamin with minerals tablet 1 tablet  1 tablet Oral Daily Gwenyth Bender, NP   1 tablet at 06/19/15 0912  . ondansetron (ZOFRAN) tablet 4 mg  4 mg Oral Q6H PRN Gwenyth Bender, NP       Or  . ondansetron [facility]) injection 4 mg  4 mg Intravenous Q6H PRN Lesle Chris Black, NP      . predniSONE (DELTASONE) tablet 50 mg  50 mg Oral Q breakfast Calvert Cantor, MD      . tamsulosin (FLOMAX) capsule 0.4 mg  0.4 mg Oral BID Gwenyth Bender, NP   0.4 mg at 06/19/15 0912  . thiamine (VITAMIN B-1) tablet 100 mg  100 mg Oral Daily Gwenyth Bender, NP   100 mg at 06/19/15 6468  . triamcinolone 0.1 % cream : eucerin cream, 1:1   Topical BID Calvert Cantor, MD         Discharge Medications: Please see discharge summary for a list of discharge medications.  Relevant Imaging Results:  Relevant Lab Results:  Recent Labs    Additional Information    Renne Crigler Rayyan, LCSW    Calvert Cantor, MD

## 2015-06-19 NOTE — Progress Notes (Signed)
[facility] Progress Note   [name]  TML:[id number]  DOB: Mar 24, 1927  DOA: 06/17/2015 PCP: [name], MD  Brief narrative: Phillip Kelley is a 81 y.o. 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 He was started on a course of doxycycline by his PCP. He took 2 days of this and called the office on 11/20 to tell his PCP that the arms were now swollen. He was then recommended to come to the hospital. In the ER he was noted to have acute on chronic renal failure and he was therefore referred for admission.   Subjective: Itching in arms is improving. No new complaints. Agreeable to go to SNF for rehab.   Assessment/Plan: Principal Problem:   Acute renal failure superimposed on stage 3 chronic kidney disease -Suspected to be volume depleted from being on furosemide has been discontinued -IV fluids given short term -Obtained urine sodium, urine creatinine and urine osmolality- renal failure appears to be ATN - Cr improving- follow  Active Problems: Rash on arms and legs - received 2 days of Doxycycline but rash worsened and swelling started - cont oral steroids, Vistaril, clilndamycin - d/c Diflucan - contacted dermatology- Dr Yetta Barre feels this is a dermatitis, possibly a contact dermatitis- recommending a 10-14 day Prednisone taper, cont Eucerin/ Triamcinolone and and antibiotic to treat bacterial superinfection - appears to be steadily improving- patient admits to using various different cream on his skin and may have easily developed a contact dermatitis    Gait abnormality/ frequent falls - PT eval - will need SNF    Chronic diastolic heart failure  - repeating ECHO- dehydrated- holding Lasix   Pacemaker-BiV Medtronic    Atrial fibrillation  - cont Amiodarone and Aspirin- not candidate for anticoagulation due to falls    Alcohol abuse - no signs of withdrawal    Hypertension - cont Hydralazine  BPH - cont Flomax  Code Status:     Code Status Orders        Start     Ordered   06/17/15 1657  Full code   Continuous     06/17/15 1656    Advance Directive Documentation        Most Recent Value   Type of Advance Directive  Healthcare Power of Attorney Mariane Baumgarten (daughter)]   Pre-existing out of facility DNR order (yellow form or pink MOST form)     "MOST" Form in Place?       Family Communication:   Disposition Plan: SNF search DVT prophylaxis: SCDs Consultants: Procedures:   Antibiotics: Anti-infectives    Start     Dose/Rate Route Frequency Ordered Stop   06/17/15 2100  fluconazole (DIFLUCAN) tablet 150 mg  Status:  Discontinued     150 mg Oral Daily 06/17/15 2048 06/17/15 2056   06/17/15 2100  fluconazole (DIFLUCAN) tablet 50 mg  Status:  Discontinued     50 mg Oral Every 24 hours 06/17/15 2057 06/19/15 1251   06/17/15 2000  clindamycin (CLEOCIN) capsule 300 mg     300 mg Oral 4 times per day 06/17/15 1656 06/22/15 1759   06/17/15 1430  clindamycin (CLEOCIN) IVPB 600 mg     600 mg 100 mL/hr over 30 Minutes Intravenous  Once 06/17/15 1428  06/17/15 1608      Objective: Filed Weights   06/17/15 1630 06/18/15 0541 06/19/15 0603  Weight: 67.132 kg (148 lb) 67.9 kg (149 lb 11.1 oz) 68.1 kg (150 lb 2.1 oz)    Intake/Output Summary (Last 24 hours) at 06/19/15 1257 Last data filed at 06/19/15 1610  Gross per 24 hour  Intake    620 ml  Output    825 ml  Net   -205 ml     Vitals Filed Vitals:   06/18/15 0541 06/18/15 1508 06/18/15 2118 06/19/15 0603  BP: 146/72 137/58 172/112 138/80  Pulse: 70 76 79 69  Temp: 98.2 F (36.8 C) 97.3 F (36.3 C) 98.1 F (36.7 C) 97.4 F (36.3 C)  TempSrc: Oral Oral Oral Oral  Resp: Weight:  67.9 kg (149 lb 11.1 oz)   68.1 kg (150 lb 2.1 oz)  SpO2: 96% 97% 93% 97%    Exam:  General:  Pt is alert, not in acute distress  HEENT: No icterus, No thrush, oral mucosa moist  Cardiovascular: regular rate and rhythm, S1/S2 No murmur  Respiratory: clear to auscultation bilaterally   Abdomen: Soft, +Bowel sounds, non tender, non distended, no guarding  MSK: No LE edema, cyanosis or clubbing  Skin: rash with bruising in some areas on arms and back with flaky dry skin- scattered areas of broken skin and bleeding           Data Reviewed: Basic Metabolic Panel:  Recent Labs Lab 06/17/15 1315 06/18/15 0547 06/19/15 0835  NA 143 143 143  K 4.1 4.0 4.8  CL 109 111 111  CO2 GLUCOSE 83 74 91  BUN 37* 38* 34*  CREATININE 2.57* 2.42* 2.12*  CALCIUM 9.4 8.7* 8.9   Liver Function Tests: No results for input(s): AST, ALT, ALKPHOS, BILITOT, PROT, ALBUMIN in the last 168 hours. No results for input(s): LIPASE, AMYLASE in the last 168 hours. No results for input(s): AMMONIA in the last 168 hours. CBC:  Recent Labs Lab 06/17/15 1315 06/18/15 0547 06/19/15 0835  WBC 10.1 8.1 10.4  NEUTROABS 6.8  --   --   HGB 9.8* 8.3* 8.9*  HCT 29.6* 25.3* 26.9*  MCV 95.8 94.8 94.1  PLT 124* 106* 109*   Cardiac Enzymes: No results for input(s): CKTOTAL, CKMB, CKMBINDEX, TROPONINI in the last 168 hours. BNP (last 3 results)  Recent Labs  12/05/14 1437  BNP 697.0*    ProBNP (last 3 results) No results for input(s): PROBNP in the last 8760 hours.  CBG: No results for input(s): GLUCAP in the last 168 hours.  No results found for this or any previous visit (from the past 240 hour(s)).   Studies: No results found.  Scheduled Meds:  Scheduled Meds: . amiodarone  100 mg Oral Daily  . aspirin EC  81 mg Oral Daily  . clindamycin  300 mg Oral 4 times per day  . docusate sodium  100 mg Oral BID  . folic acid  1 mg Oral Daily  . guaiFENesin  600 mg Oral BID  .  hydrALAZINE  25 mg Oral BID  . levothyroxine  50 mcg Oral QAC breakfast  . multivitamin with minerals  1 tablet Oral Daily  . predniSONE  50 mg Oral Q breakfast  . tamsulosin  0.4 mg Oral BID  . thiamine  100 mg Oral Daily  . triamcinolone 0.1 % cream : eucerin   Topical BID   Continuous Infusions:  Time spent on care of this patient: 35 min   Tiler Brandis, MD 06/19/2015, 12:57 PM  LOS: 2 days   [facility] Office  [phone] Pager - Text Page per [email or web address] If 7PM-7AM, please contact night-coverage [email or web address]

## 2015-06-19 NOTE — Progress Notes (Signed)
Per Dr Butler Denmark, ok to D/C fungal stain and cytology orders.

## 2015-06-19 NOTE — Progress Notes (Signed)
Called portable to order SCD's.

## 2015-06-19 NOTE — Progress Notes (Signed)
Physical Therapy Treatment Patient Details Name: Phillip Kelley MRN: [id number] DOB: 10/10/1926 Today's Date: 06/19/2015    History of Present Illness Pt adm with acute on chronic renal failure and bilateral arm and back rash. PMH - CHF, HTN, etoh abuse, pacer, afib    PT Comments    Pt continues to demonstrate very limited mobility and requires physical assist for all mobility. When asked how he gets up at home pt states his wife, son, or aide help him. Since pt refusing SNF recommend resuming HHPT. Expect pt will continue to experience frequent falls at home.  Follow Up Recommendations  Home health PT;Supervision/Assistance - 24 hour (Pt refusing SNF.)     Equipment Recommendations  None recommended by PT    Recommendations for Other Services OT consult     Precautions / Restrictions Precautions Precautions: Fall Restrictions Weight Bearing Restrictions: No    Mobility  Bed Mobility                  Transfers Overall transfer level: Needs assistance Equipment used: 4-wheeled walker Transfers: Sit to/from Stand Sit to Stand: Mod assist         General transfer comment: Assist to bring hips up and for balance  Ambulation/Gait Ambulation/Gait assistance: Min assist;+2 safety/equipment Ambulation Distance (Feet): 50 Feet Assistive device: 4-wheeled walker Gait Pattern/deviations: Step-to pattern;Decreased step length - right;Decreased step length - left;Shuffle;Trunk flexed Gait velocity: decr Gait velocity interpretation: Below normal speed for age/gender General Gait Details: Assist for balance and support. Verbal cues to stand more erect. Pt with flexed knees throughout. Second person followed closely with recliner   Stairs            Wheelchair Mobility    Modified Rankin (Stroke Patients Only)       Balance   Sitting-balance support: No upper extremity supported;Feet supported Sitting balance-Leahy Scale: Fair     Standing balance  support: Bilateral upper extremity supported Standing balance-Leahy Scale: Poor Standing balance comment: rollator and min A for static sitting                    Cognition Arousal/Alertness: Awake/alert Behavior During Therapy: WFL for tasks assessed/performed Overall Cognitive Status: No family/caregiver present to determine baseline cognitive functioning Area of Impairment: Orientation;Memory;Problem solving Orientation Level:  (Pt )   Memory: Decreased short-term memory       Problem Solving: Slow processing;Requires verbal cues;Requires tactile cues General Comments: On entering room pt in recliner but thought he was sitting on a BSC. Pt also asking where his lunch bag was.     Exercises      General Comments        Pertinent Vitals/Pain Pain Assessment: No/denies pain    Home Living                      Prior Function            PT Goals (current goals can now be found in the care plan section) Acute Rehab PT Goals Patient Stated Goal: return home PT Goal Formulation: With patient Time For Goal Achievement: 07/02/15 Potential to Achieve Goals: Fair Progress towards PT goals: Progressing toward goals    Frequency  Min 3X/week    PT Plan Discharge plan needs to be updated    Co-evaluation             End of Session Equipment Utilized During Treatment: Gait belt Activity Tolerance: Patient limited by fatigue Patient left:  in chair;with call bell/phone within reach;with chair alarm set     Time: [phone] PT Time Calculation (min) (ACUTE ONLY): 14 min  Charges:  $Gait Training: 8-22 mins                    G Codes:      Carlynn Leduc July 14, 2015, 1:36 PM [employer] PT [phone]

## 2015-06-19 NOTE — Progress Notes (Signed)
Echocardiogram 2D Echocardiogram has been performed.  Dorothey Baseman 06/19/2015, 1:56 PM

## 2015-06-19 NOTE — Clinical Social Work Note (Signed)
Clinical Social Work Assessment  Patient Details  Name: Phillip Kelley MRN: [id number] Date of Birth: 12-09-1926  Date of referral:  06/19/15               Reason for consult:  Facility Placement                Permission sought to share information with:  Facility Medical sales representative, Family Supports Permission granted to share information::  Yes, Verbal Permission Granted  Name::     Phillip Kelley  Agency::  [facility] SNF's  Relationship::  Wife  Contact Information:  [phone]   Housing/Transportation Living arrangements for the past 2 months:  Single Family Home Source of Information:  Patient Patient Interpreter Needed:  None Criminal Activity/Legal Involvement Pertinent to Current Situation/Hospitalization:  No - Comment as needed Significant Relationships:  Spouse Lives with:  Spouse Do you feel safe going back to the place where you live?  No Need for family participation in patient care:  Yes (Comment)  Care giving concerns:  CSW received consult to speak to patient again regarding SNF placement upon discharge. Patient reported being still unsure of whether he wants to go to SNF but did give me permission to send out an FL2. Patient stated he did not want to go to SNF unless he absolutely has to. CSW to continue to follow patient for discharge needs.    Social Worker assessment / plan:  CSW spoke with patient concerning possibility of rehab at [facility] before returning home.  Employment status:  Retired Health and safety inspector:  Medicare PT Recommendations:  Skilled Nursing Facility Information / Referral to community resources:  Skilled Nursing Facility  Patient/Family's Response to care:  Patient recognizes need for rehab before returning home and might be agreeable to SNF. CSW left voicemail for patient's wife to contact CSW to further evaluate possibility of patient going to SNF.  Patient/Family's Understanding of and Emotional Response to Diagnosis, Current  Treatment, and Prognosis:  Patient is realistic regarding therapy needs. No questions/concerns about plan or treatment at this time.    Emotional Assessment Appearance:  Appears stated age Attitude/Demeanor/Rapport:    Affect (typically observed):  Appropriate, Stoic Orientation:  Oriented to Self, Oriented to Place, Oriented to  Time, Oriented to Situation Alcohol / Substance use:  Not Applicable Psych involvement (Current and /or in the community):  No (Comment)  Discharge Needs  Concerns to be addressed:  Care Coordination Readmission within the last 30 days:  No Current discharge risk:  None Barriers to Discharge:  Continued Medical Work up   [employer], LCSW 06/19/2015, 2:47 PM

## 2015-06-20 DIAGNOSIS — I5042 Chronic combined systolic (congestive) and diastolic (congestive) heart failure: Secondary | ICD-10-CM

## 2015-06-20 DIAGNOSIS — Z950 Presence of cardiac pacemaker: Secondary | ICD-10-CM

## 2015-06-20 DIAGNOSIS — I480 Paroxysmal atrial fibrillation: Secondary | ICD-10-CM

## 2015-06-20 DIAGNOSIS — N183 Chronic kidney disease, stage 3 (moderate): Secondary | ICD-10-CM

## 2015-06-20 DIAGNOSIS — N179 Acute kidney failure, unspecified: Secondary | ICD-10-CM

## 2015-06-20 LAB — BASIC METABOLIC PANEL
ANION GAP: 9 (ref 5–15)
BUN: 35 mg/dL — ABNORMAL HIGH (ref 6–20)
CHLORIDE: 108 mmol/L (ref 101–111)
CO2: 24 mmol/L (ref 22–32)
Calcium: 8.6 mg/dL — ABNORMAL LOW (ref 8.9–10.3)
Creatinine, Ser: 2.02 mg/dL — ABNORMAL HIGH (ref 0.61–1.24)
GFR calc non Af Amer: 28 mL/min — ABNORMAL LOW (ref >60)
GFR, EST AFRICAN AMERICAN: 33 mL/min — AB (ref >60)
Glucose, Bld: 121 mg/dL — ABNORMAL HIGH (ref 65–99)
Potassium: 4.5 mmol/L (ref 3.5–5.1)
Sodium: 141 mmol/L (ref 135–145)

## 2015-06-20 LAB — BRAIN NATRIURETIC PEPTIDE: B Natriuretic Peptide: 2406.3 pg/mL — ABNORMAL HIGH (ref 0.0–100.0)

## 2015-06-20 NOTE — Progress Notes (Signed)
Spoke with RN who confirmed that pt will be for SNF at d/c, per his daughter/wife.  Daughter contacted to provide bed offers, await return call.  Pollyann Savoy, LCSW ([place] [employer]) [phone]

## 2015-06-20 NOTE — Progress Notes (Signed)
[facility] Progress Note   GABRIAN DUDDY  HLK:[id number]  DOB: 02/05/1927  DOA: 06/17/2015 PCP: [name], MD  Brief narrative: GAIL BOTSCH is a 81 y.o. 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 He was started on a course of doxycycline by his PCP. He took 2 days of this and called the office on 11/20 to tell his PCP that the arms were now swollen. He was then recommended to come to the hospital. In the ER he was noted to have acute on chronic renal failure and he was therefore referred for admission.   Subjective: Itching in arms continues to improved. Had difficultly sleeping last night.    Assessment/Plan: Principal Problem:   Acute renal failure superimposed on stage 3 chronic kidney disease -Suspected to be volume depleted from being on furosemide has been discontinued -IV fluids given short term -Obtained urine sodium, urine creatinine and urine osmolality- renal failure appears to be ATN - Cr improving- follow  Active Problems: Rash on arms and legs - received 2 days of Doxycycline but rash worsened and swelling started - cont oral steroids, Vistaril, clilndamycin - d/c Diflucan - contacted dermatology- Dr Yetta Barre feels this is a dermatitis, possibly a contact dermatitis- recommending a 10-14 day Prednisone taper, cont Eucerin/ Triamcinolone and and antibiotic to treat bacterial superinfection - appears to be steadily improving- patient admits to using various different cream on his skin and may have easily developed a contact dermatitis -family tells me today that this rash is a recurrent issue that his dermatologist gives him a Prednisone taper every time it flares up- his dermatologist is  not aware of the etiology for the rash    Gait abnormality/ frequent falls - PT eval - will need SNF    Chronic diastolic heart failure  - repeating ECHO reveals EF of 45-50% and grade 2 Diastolic dysfunction - he has focal hypokinesis as well - as he appeared volume depleted on admission, Lasix has been held    Pacemaker-BiV Medtronic    Atrial fibrillation  - cont Amiodarone and Aspirin- not candidate for anticoagulation due to falls    Alcohol abuse - no signs of withdrawal    Hypertension - cont Hydralazine  BPH - cont Flomax  Code Status:     Code Status Orders        Start     Ordered   06/17/15 1657  Full code   Continuous     06/17/15 1656    Advance Directive Documentation        Most Recent Value   Type of Advance Directive  Healthcare Power of Attorney Mariane Baumgarten (daughter)]   Pre-existing out of facility DNR order (yellow form or pink MOST form)     "MOST" Form in Place?       Family Communication:  lengthy conversation with wife and daughter today Disposition Plan: SNF search DVT prophylaxis: SCDs Consultants: Procedures: 2 D ECHO  Left ventricle: The cavity size was normal. Wall thickness was normal. Systolic function was mildly reduced. The estimated ejection fraction was in the range of 45% to 50%. Hypokinesis of the basal-midinferolateral and inferior myocardium. Features are consistent with a pseudonormal left ventricular filling pattern, with concomitant abnormal relaxation and increased filling pressure (  grade 2 diastolic dysfunction). - Ventricular septum: Septal motion showed paradox. These changes are consistent with right ventricular pacing. - Aortic valve: There was trivial regurgitation. - Mitral valve: Calcified annulus. There was mild regurgitation. - Left atrium: The atrium was severely dilated. - Atrial septum: No defect or patent foramen ovale was identified. - Pulmonary arteries: Systolic pressure was  mildly increased. PA peak pressure: 41 mm Hg (S).  Antibiotics: Anti-infectives    Start     Dose/Rate Route Frequency Ordered Stop   06/17/15 2100  fluconazole (DIFLUCAN) tablet 150 mg  Status:  Discontinued     150 mg Oral Daily 06/17/15 2048 06/17/15 2056   06/17/15 2100  fluconazole (DIFLUCAN) tablet 50 mg  Status:  Discontinued     50 mg Oral Every 24 hours 06/17/15 2057 06/19/15 1251   06/17/15 2000  clindamycin (CLEOCIN) capsule 300 mg     300 mg Oral 4 times per day 06/17/15 1656 06/22/15 1759   06/17/15 1430  clindamycin (CLEOCIN) IVPB 600 mg     600 mg 100 mL/hr over 30 Minutes Intravenous  Once 06/17/15 1428 06/17/15 1608      Objective: Filed Weights   06/18/15 0541 06/19/15 0603 06/20/15 0505  Weight: 67.9 kg (149 lb 11.1 oz) 68.1 kg (150 lb 2.1 oz) 68.4 kg (150 lb 12.7 oz)    Intake/Output Summary (Last 24 hours) at 06/20/15 1528 Last data filed at 06/20/15 1317  Gross per 24 hour  Intake    120 ml  Output      0 ml  Net    120 ml     Vitals Filed Vitals:   06/19/15 2122 06/20/15 0505 06/20/15 0830 06/20/15 1317  BP: 173/74 141/76 168/89 157/78  Pulse: 75 70 70 56  Temp: 98.2 F (36.8 C) 98.1 F (36.7 C)  98.5 F (36.9 C)  TempSrc: Oral Oral  Axillary  Resp: Weight:  68.4 kg (150 lb 12.7 oz)    SpO2: 95% 95%  98%    Exam:  General:  Pt is alert, not in acute distress  HEENT: No icterus, No thrush, oral mucosa moist  Cardiovascular: regular rate and rhythm, S1/S2 No murmur  Respiratory: clear to auscultation bilaterally   Abdomen: Soft, +Bowel sounds, non tender, non distended, no guarding  MSK: No LE edema, cyanosis or clubbing  Skin: rash with bruising in some areas on arms and back with flaky dry skin- scattered areas of broken skin and bleeding are healing           Data Reviewed: Basic Metabolic Panel:  Recent Labs Lab 06/17/15 1315 06/18/15 0547 06/19/15 0835 06/20/15 0828  NA 143 143 143 141  K 4.1 4.0  4.8 4.5  CL 109 111 111 108  CO2 GLUCOSE 83 74 91 121*  BUN 37* 38* 34* 35*  CREATININE 2.57* 2.42* 2.12* 2.02*  CALCIUM 9.4 8.7* 8.9 8.6*   Liver Function Tests: No results for input(s): AST, ALT, ALKPHOS, BILITOT, PROT, ALBUMIN in the last 168 hours. No results for input(s): LIPASE, AMYLASE in the last 168 hours. No results for input(s): AMMONIA in the last 168 hours. CBC:  Recent Labs Lab 06/17/15 1315 06/18/15 0547 06/19/15 0835  WBC 10.1 8.1 10.4  NEUTROABS 6.8  --   --   HGB 9.8* 8.3* 8.9*  HCT 29.6* 25.3* 26.9*  MCV 95.8 94.8 94.1  PLT 124* 106* 109*   Cardiac Enzymes: No results for input(s):  CKTOTAL, CKMB, CKMBINDEX, TROPONINI in the last 168 hours. BNP (last 3 results)  Recent Labs  12/05/14 1437  BNP 697.0*    ProBNP (last 3 results) No results for input(s): PROBNP in the last 8760 hours.  CBG: No results for input(s): GLUCAP in the last 168 hours.  No results found for this or any previous visit (from the past 240 hour(s)).   Studies: No results found.  Scheduled Meds:  Scheduled Meds: . amiodarone  100 mg Oral Daily  . aspirin EC  81 mg Oral Daily  . clindamycin  300 mg Oral 4 times per day  . docusate sodium  100 mg Oral BID  . folic acid  1 mg Oral Daily  . guaiFENesin  600 mg Oral BID  . hydrALAZINE  25 mg Oral BID  . levothyroxine  50 mcg Oral QAC breakfast  . multivitamin with minerals  1 tablet Oral Daily  . predniSONE  50 mg Oral Q breakfast  . tamsulosin  0.4 mg Oral BID  . thiamine  100 mg Oral Daily  . triamcinolone 0.1 % cream : eucerin   Topical BID   Continuous Infusions:   Time spent on care of this patient: 35 min   Dalan Cowger, MD 06/20/2015, 3:28 PM  LOS: 3 days   [facility] Office  [phone] Pager - Text Page per [email or web address] If 7PM-7AM, please contact night-coverage [email or web address]

## 2015-06-20 NOTE — Consult Note (Addendum)
Name: Phillip Kelley is a 81 y.o. male Admit date: 06/17/2015 Referring Physician:  Butler Denmark, MD Primary Physician:  Antony Haste, M.D. Primary Cardiologist:  Lewayne Bunting, EP  Reason for Consultation:  Concern about decreased EF and when to resume diuretic therapy  ASSESSMENT: 1. Chronic combined systolic and diastolic heart failure with ejection fraction ranging between 40 and 65% over the past 4 years (60% 2012, 40-45% 2013, 65% 2014, and 45-50% currently). The current echo EF of 45% is similar to 2013. A lot of this could be explained by interobserver variability or superimposed ischemia which in his current clinical condition does not warrant evaluation.  No obvious evidence of volume overload by my exam today.   2. Acute kidney failure, slowly resolving /with improving kidney function. Etiology is unclear. Possibly related to dehydration in the setting of decreased oral intake.  3. History of paroxysmal atrial fibrillation. No 12-lead available this admission. AV sequential pacing noted on telemetry. Therefore rhythm suppression with amiodarone has been successful.  4. Essential hypertension with elevated systolic pressures  5. Severe anemia  PLAN:  1. No further concern or evaluation is needed for the change in ejection fraction noted between the current and 2014 study.  2. Do not resume diuretic therapy until there is evidence of euvolemia / mild volume excess to allow kidney function to recover as fully as possible. Kidney function is near baseline now which has been in a range of creatinine between 1.7 and 1.9 over the past 12 months. I would not discharge from the hospital without diuretic therapy unless the patient's oral intake is significantly diminished.  3. Check BNP   HPI: 81 year old gentleman whom we are asked to see because of concern about LVEF decrease. There is also concern about whether/went to resume diuretic therapy in this setting of acute kidney injury  that is slowly resolving.   The patient is unable to give a history. His daughter is in the room with him. She states that if he is not on diuretic therapy he usually gets significant swelling in his lower extremities and shortness of breath. The patient is currently lying relatively flat and data suggests O2 saturations above 96% on room air and mouth breathing.     PMH:   Past Medical History  Diagnosis Date  . Cataract   . Hypertension   . Chronic diastolic CHF (congestive heart failure) (HCC)     a. 09/2014 Echo: EF 65-70%, sev LVH, Gr 1 DD, mod dil LA.  Marland Kitchen Symptomatic bradycardia     a. 01/2012 s/p Medtronic BiV PPM  . Anemia   . History of blood transfusion 2001    following hip surgery.  . Arthritis   . Chronic lower back pain   . Leg edema   . History of DVT (deep vein thrombosis)   . NICM (nonischemic cardiomyopathy) (HCC)     a. 09/2014 Echo: EF 65-70%.  . Gait instability   . ETOH abuse   . PAF (paroxysmal atrial fibrillation) (HCC)     a. No OAC 2/2 unsteady gait/falls.    PSH:   Past Surgical History  Procedure Laterality Date  . Anterior fusion cervical spine  2001    for numbness in right arm, left with anhydrosis  . Total hip arthroplasty  2001    right; fell  . Carotid endarterectomy  ~ 2008    left  . Carpal tunnel release      bilaterally  . Insert / replace / remove pacemaker  01/25/12    Medtronic BiV PPM  . Tonsillectomy and adenoidectomy  ~ 1933  . Joint replacement    . Cataract extraction w/ intraocular lens implant  ~2011    left  . Ganglion cyst excision      ? left  . Bi-ventricular pacemaker insertion N/A 01/25/2012    Procedure: BI-VENTRICULAR PACEMAKER INSERTION (CRT-P);  Surgeon: Marinus Maw, MD;  Location[facility] CATH LAB;  Service: Cardiovascular;  Laterality: N/A;   Allergies:  Oxycodone; Penicillins; Sulfa antibiotics; and Losartan Prior to Admit Meds:   Prescriptions prior to admission  Medication Sig Dispense Refill Last Dose  .  amiodarone (PACERONE) 200 MG tablet Take 100 mg by mouth daily. Patient taking by mouth one half tablet (100 mg) daily  3 06/17/2015 at Unknown time  . Calcium Carbonate-Vitamin D (CALTRATE 600+D PO) Take 1 tablet by mouth 2 (two) times daily.   06/17/2015 at Unknown time  . doxycycline (ADOXA) 100 MG tablet Take 100 mg by mouth 2 (two) times daily.   06/17/2015 at Unknown time  . furosemide (LASIX) 20 MG tablet TAKE 1 TABLET (20 MG TOTAL) BY MOUTH DAILY. 30 tablet 0 06/17/2015 at Unknown time  . Ginkgo Biloba (GNP GINGKO BILOBA EXTRACT PO) Take 100 mg by mouth daily.   06/17/2015 at Unknown time  . hydrALAZINE (APRESOLINE) 25 MG tablet Take 25 mg by mouth 2 (two) times daily.   06/17/2015 at am  . hydrOXYzine (ATARAX/VISTARIL) 10 MG tablet Take 10 mg by mouth 3 (three) times daily.   06/17/2015 at Unknown time  . levothyroxine (SYNTHROID, LEVOTHROID) 50 MCG tablet Take 50 mcg by mouth daily.   0 06/17/2015 at Unknown time  . Melatonin 3 MG TABS Take 3 mg by mouth at bedtime.   06/16/2015 at Unknown time  . Multiple Vitamin (MULTIVITAMIN) tablet Take 1 tablet by mouth daily.   06/17/2015 at Unknown time  . Omega-3 Fatty Acids (FISH OIL) 1000 MG CAPS Take 1,000 mg by mouth 2 (two) times daily.   06/17/2015 at Unknown time  . Tamsulosin HCl (FLOMAX) 0.4 MG CAPS Take 0.4 mg by mouth 2 (two) times daily.    06/17/2015 at Unknown time   Fam HX:    Family History  Problem Relation Age of Onset  . Congestive Heart Failure Mother    Social HX:    Social History   Social History  . Marital Status: Married    Spouse Name: N/A  . Number of Children: N/A  . Years of Education: N/A   Occupational History  . Not on file.   Social History Main Topics  . Smoking status: Former Smoker -- 1.00 packs/day for 35 years    Types: Cigarettes    Quit date: 08/21/1977  . Smokeless tobacco: Never Used  . Alcohol Use: 12.6 oz/week    14 Glasses of wine, 7 Shots of liquor per week     Comment: As much as  possible  . Drug Use: Not on file  . Sexual Activity: Not Currently   Other Topics Concern  . Not on file   Social History Narrative     Review of Systems: Unable to obtain  Physical Exam: Blood pressure 157/78, pulse 56, temperature 98.5 F (36.9 C), temperature source Axillary, resp. rate 17, weight 150 lb 12.7 oz (68.4 kg), SpO2 98 %. Weight change: 10.6 oz (0.3 kg)  Neck veins are difficult to assess  The patient is difficult to arouse  Mucous membranes appear dry  Chest  is clear anteriorly  Heart rhythm is normal/regular. No obvious gallop or murmur is heard  Abdomen is soft  Extremities reveal no edema  Neurological exam is impaired by the patient's decreased alertness. He will arouse with physical stimuli.   Labs: Lab Results  Component Value Date   WBC 10.4 06/19/2015   HGB 8.9* 06/19/2015   HCT 26.9* 06/19/2015   MCV 94.1 06/19/2015   PLT 109* 06/19/2015    Recent Labs Lab 06/20/15 0828  NA 141  K 4.5  CL 108  CO2 24  BUN 35*  CREATININE 2.02*  CALCIUM 8.6*  GLUCOSE 121*   No results found for: PTT Lab Results  Component Value Date   INR 1.37 06/17/2015   INR 1.39 07/10/2014   INR 2.57* 07/08/2014   Lab Results  Component Value Date   CKTOTAL 91 11/29/2012   CKMBINDEX 5.6* 11/29/2012    No results found for: CHOL No results found for: HDL No results found for: LDLCALC No results found for: TRIG No results found for: CHOLHDL No results found for: LDLDIRECT    Radiology:  No results found.  EKG:  No EKG this admission. Monitor reveals AV sequential pacing   Lesleigh Noe 06/20/2015 4:36 PM

## 2015-06-20 NOTE — Care Management Important Message (Signed)
Important Message  Patient Details  Name: Phillip Kelley MRN: [id number] Date of Birth: 10/21/1926   Medicare Important Message Given:  Yes    [name] 06/20/2015, 12:54 PM

## 2015-06-20 NOTE — Progress Notes (Signed)
Bed offers given to pt's daughter over phone.  She will be visiting facilities this weekend in anticipation of a d/c on Monday.  CSW will continue to follow for bed preference/NH tx.  Pollyann Savoy LCSW (Saturday Coverage)  [phone]

## 2015-06-21 ENCOUNTER — Inpatient Hospital Stay (HOSPITAL_COMMUNITY): Payer: Medicare Other

## 2015-06-21 LAB — BASIC METABOLIC PANEL
ANION GAP: 6 (ref 5–15)
BUN: 35 mg/dL — ABNORMAL HIGH (ref 6–20)
CALCIUM: 8.7 mg/dL — AB (ref 8.9–10.3)
CO2: 25 mmol/L (ref 22–32)
Chloride: 111 mmol/L (ref 101–111)
Creatinine, Ser: 1.89 mg/dL — ABNORMAL HIGH (ref 0.61–1.24)
GFR, EST AFRICAN AMERICAN: 35 mL/min — AB (ref >60)
GFR, EST NON AFRICAN AMERICAN: 31 mL/min — AB (ref >60)
Glucose, Bld: 93 mg/dL (ref 65–99)
Potassium: 4.3 mmol/L (ref 3.5–5.1)
Sodium: 142 mmol/L (ref 135–145)

## 2015-06-21 MED ORDER — FUROSEMIDE 10 MG/ML IJ SOLN
20.0000 mg | Freq: Two times a day (BID) | INTRAMUSCULAR | Status: DC
  Administered 2015-06-21: 20 mg via INTRAVENOUS
  Filled 2015-06-21: qty 2

## 2015-06-21 MED ORDER — HYDRALAZINE HCL 20 MG/ML IJ SOLN
10.0000 mg | Freq: Four times a day (QID) | INTRAMUSCULAR | Status: DC | PRN
  Administered 2015-06-21 – 2015-06-22 (×2): 10 mg via INTRAVENOUS
  Filled 2015-06-21 (×2): qty 1

## 2015-06-21 MED ORDER — MENTHOL 3 MG MT LOZG
1.0000 | LOZENGE | OROMUCOSAL | Status: DC | PRN
  Administered 2015-06-21: 3 mg via ORAL
  Filled 2015-06-21: qty 9

## 2015-06-21 NOTE — Progress Notes (Signed)
[facility] Progress Note   Phillip Kelley  ZOX:[id number]  DOB: 03-16-1927  DOA: 06/17/2015 PCP: [name], MD  Brief narrative: Phillip Kelley is a 81 y.o. 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 He was started on a course of doxycycline by his PCP. He took 2 days of this and called the office on 11/20 to tell his PCP that the arms were now swollen. He was then recommended to come to the hospital. In the ER he was noted to have acute on chronic renal failure and he was therefore referred for admission.   Subjective: Complains of shortness of breath today. No cough. Eating and drinking well.  Assessment/Plan: Principal Problem:   Acute renal failure superimposed on stage 3 chronic kidney disease -Suspected to be volume depleted from being on furosemide has been discontinued -IV fluids given short term -Obtained urine sodium, urine creatinine and urine osmolality- renal failure appears to be ATN - Cr improving- follow  Active Problems:  Shortness of breath Systolic and diastolic heart failure  -  ECHO reveals EF of 45-50% and grade 2 Diastolic dysfunction - he has focal hypokinesis as well -Lasix has been on hold due to acute renal failure from ATN-chest x-ray today shows mild pulmonary edema - no obvious dyspnea noted while at rest-pulse ox is 92-93% on room air - will discuss resuming Lasix with renal   Rash on arms and legs - received 2 days of Doxycycline but rash worsened and swelling started - cont oral steroids, Vistaril, clilndamycin - d/c Diflucan - contacted dermatology- Dr Yetta Barre feels this is a dermatitis, possibly a contact dermatitis- recommending a 10-14 day Prednisone taper, cont  Eucerin/ Triamcinolone and and antibiotic to treat bacterial superinfection - appears to be steadily improving- patient admits to using various different cream on his skin and may have easily developed a contact dermatitis -family tells me today that this rash is a recurrent issue that his dermatologist gives him a Prednisone taper every time it flares up- his dermatologist is not aware of the etiology for the rash    Gait abnormality/ frequent falls - PT eval - will need SNF     Pacemaker-BiV Medtronic    Atrial fibrillation  - cont Amiodarone and Aspirin- not candidate for anticoagulation due to falls    Alcohol abuse - no signs of withdrawal    Hypertension - cont Hydralazine  BPH - cont Flomax  Code Status:     Code Status Orders        Start     Ordered   06/17/15 1657  Full code   Continuous     06/17/15 1656    Advance Directive Documentation        Most Recent Value   Type of Advance Directive  Healthcare Power of Attorney Mariane Baumgarten (daughter)]   Pre-existing out of facility DNR order (yellow form or pink MOST form)     "MOST" Form in Place?       Family Communication:  lengthy conversation with wife and daughter on 12/3 Disposition Plan: SNF search DVT prophylaxis: SCDs Consultants: Procedures: 2 D ECHO  Left ventricle: The cavity size was normal. Wall thickness was normal. Systolic function was mildly reduced. The estimated ejection fraction was  in the range of 45% to 50%. Hypokinesis of the basal-midinferolateral and inferior myocardium. Features are consistent with a pseudonormal left ventricular filling pattern, with concomitant abnormal relaxation and increased filling pressure (grade 2 diastolic dysfunction). - Ventricular septum: Septal motion showed paradox. These changes are consistent with right ventricular pacing. - Aortic valve: There was trivial regurgitation. - Mitral valve: Calcified annulus. There was mild  regurgitation. - Left atrium: The atrium was severely dilated. - Atrial septum: No defect or patent foramen ovale was identified. - Pulmonary arteries: Systolic pressure was mildly increased. PA peak pressure: 41 mm Hg (S).  Antibiotics: Anti-infectives    Start     Dose/Rate Route Frequency Ordered Stop   06/17/15 2100  fluconazole (DIFLUCAN) tablet 150 mg  Status:  Discontinued     150 mg Oral Daily 06/17/15 2048 06/17/15 2056   06/17/15 2100  fluconazole (DIFLUCAN) tablet 50 mg  Status:  Discontinued     50 mg Oral Every 24 hours 06/17/15 2057 06/19/15 1251   06/17/15 2000  clindamycin (CLEOCIN) capsule 300 mg     300 mg Oral 4 times per day 06/17/15 1656 06/22/15 1759   06/17/15 1430  clindamycin (CLEOCIN) IVPB 600 mg     600 mg 100 mL/hr over 30 Minutes Intravenous  Once 06/17/15 1428 06/17/15 1608      Objective: Filed Weights   06/19/15 0603 06/20/15 0505 06/21/15 0332  Weight: 68.1 kg (150 lb 2.1 oz) 68.4 kg (150 lb 12.7 oz) 66.452 kg (146 lb 8 oz)    Intake/Output Summary (Last 24 hours) at 06/21/15 1445 Last data filed at 06/21/15 1044  Gross per 24 hour  Intake    460 ml  Output   1175 ml  Net   -715 ml     Vitals Filed Vitals:   06/21/15 0332 06/21/15 0423 06/21/15 0501 06/21/15 0604  BP:  158/77 157/75 170/81  Pulse:  69 72 74  Temp:   97.7 F (36.5 C)   TempSrc:   Oral   Resp:  22 18   Weight: 66.452 kg (146 lb 8 oz)     SpO2:  95% 93% 92%    Exam:  General:  Pt is alert, not in acute distress  HEENT: No icterus, No thrush, oral mucosa moist  Cardiovascular: regular rate and rhythm, S1/S2 No murmur  Respiratory: clear to auscultation bilaterally   Abdomen: Soft, +Bowel sounds, non tender, non distended, no guarding  MSK: No LE edema, cyanosis or clubbing  Skin: rash with bruising in some areas on arms and back with flaky dry skin- scattered areas of broken skin and bleeding are healing           Data Reviewed: Basic Metabolic  Panel:  Recent Labs Lab 06/17/15 1315 06/18/15 0547 06/19/15 0835 06/20/15 0828 06/21/15 0615  NA 143 143 143 141 142  K 4.1 4.0 4.8 4.5 4.3  CL 109 111 111 108 111  CO2 26 24 23 24 25   GLUCOSE 83 74 91 121* 93  BUN 37* 38* 34* 35* 35*  CREATININE 2.57* 2.42* 2.12* 2.02* 1.89*  CALCIUM 9.4 8.7* 8.9 8.6* 8.7*   Liver Function Tests: No results for input(s): AST, ALT, ALKPHOS, BILITOT, PROT, ALBUMIN in the last 168 hours. No results for input(s): LIPASE, AMYLASE in the last 168 hours. No results for input(s): AMMONIA in the last 168 hours. CBC:  Recent Labs Lab 06/17/15 1315 06/18/15 0547 06/19/15 0835  WBC 10.1 8.1 10.4  NEUTROABS 6.8  --   --  HGB 9.8* 8.3* 8.9*  HCT 29.6* 25.3* 26.9*  MCV 95.8 94.8 94.1  PLT 124* 106* 109*   Cardiac Enzymes: No results for input(s): CKTOTAL, CKMB, CKMBINDEX, TROPONINI in the last 168 hours. BNP (last 3 results)  Recent Labs  12/05/14 1437 06/20/15 1915  BNP 697.0* 2406.3*    ProBNP (last 3 results) No results for input(s): PROBNP in the last 8760 hours.  CBG: No results for input(s): GLUCAP in the last 168 hours.  No results found for this or any previous visit (from the past 240 hour(s)).   Studies: Dg Chest Port 1 View  06/21/2015  CLINICAL DATA:  Dyspnea EXAM: PORTABLE CHEST 1 VIEW COMPARISON:  12/05/2014 chest radiograph. FINDINGS: Stable configuration of 3 lead left subclavian pacemaker. Stable cardiomediastinal silhouette with mild cardiomegaly. No pneumothorax. Possible small left pleural effusion. Low lung volumes. Mild pulmonary edema. Bibasilar lung opacities likely represent atelectasis. Severe degenerative changes in the bilateral glenohumeral joints. IMPRESSION: 1. Mild cardiomegaly and mild pulmonary edema, most in keeping with mild congestive heart failure. 2. Low lung volumes with bibasilar atelectasis. 3. Likely small left pleural effusion. Electronically Signed   By: Delbert Phenix M.D.   On: 06/21/2015 12:49     Scheduled Meds:  Scheduled Meds: . amiodarone  100 mg Oral Daily  . aspirin EC  81 mg Oral Daily  . clindamycin  300 mg Oral 4 times per day  . docusate sodium  100 mg Oral BID  . folic acid  1 mg Oral Daily  . guaiFENesin  600 mg Oral BID  . hydrALAZINE  25 mg Oral BID  . levothyroxine  50 mcg Oral QAC breakfast  . multivitamin with minerals  1 tablet Oral Daily  . predniSONE  50 mg Oral Q breakfast  . tamsulosin  0.4 mg Oral BID  . thiamine  100 mg Oral Daily  . triamcinolone 0.1 % cream : eucerin   Topical BID   Continuous Infusions:   Time spent on care of this patient: 35 min   Krikor Willet, MD 06/21/2015, 2:45 PM  LOS: 4 days   [facility] Office  [phone] Pager - Text Page per [email or web address] If 7PM-7AM, please contact night-coverage [email or web address]

## 2015-06-22 ENCOUNTER — Inpatient Hospital Stay (HOSPITAL_COMMUNITY): Payer: Medicare Other

## 2015-06-22 DIAGNOSIS — I481 Persistent atrial fibrillation: Secondary | ICD-10-CM

## 2015-06-22 LAB — CBC
HCT: 27.5 % — ABNORMAL LOW (ref 39.0–52.0)
Hemoglobin: 8.9 g/dL — ABNORMAL LOW (ref 13.0–17.0)
MCH: 30.6 pg (ref 26.0–34.0)
MCHC: 32.4 g/dL (ref 30.0–36.0)
MCV: 94.5 fL (ref 78.0–100.0)
PLATELETS: 142 10*3/uL — AB (ref 150–400)
RBC: 2.91 MIL/uL — AB (ref 4.22–5.81)
RDW: 16.4 % — ABNORMAL HIGH (ref 11.5–15.5)
WBC: 12.2 10*3/uL — AB (ref 4.0–10.5)

## 2015-06-22 LAB — BASIC METABOLIC PANEL
Anion gap: 6 (ref 5–15)
BUN: 38 mg/dL — ABNORMAL HIGH (ref 6–20)
CALCIUM: 8.6 mg/dL — AB (ref 8.9–10.3)
CO2: 27 mmol/L (ref 22–32)
CREATININE: 1.93 mg/dL — AB (ref 0.61–1.24)
Chloride: 109 mmol/L (ref 101–111)
GFR calc non Af Amer: 30 mL/min — ABNORMAL LOW (ref >60)
GFR, EST AFRICAN AMERICAN: 35 mL/min — AB (ref >60)
Glucose, Bld: 95 mg/dL (ref 65–99)
Potassium: 4.4 mmol/L (ref 3.5–5.1)
SODIUM: 142 mmol/L (ref 135–145)

## 2015-06-22 LAB — BRAIN NATRIURETIC PEPTIDE: B NATRIURETIC PEPTIDE 5: 1473.9 pg/mL — AB (ref 0.0–100.0)

## 2015-06-22 MED ORDER — OXYMETAZOLINE HCL 0.05 % NA SOLN
1.0000 | Freq: Two times a day (BID) | NASAL | Status: DC
  Administered 2015-06-22 – 2015-06-24 (×5): 1 via NASAL
  Filled 2015-06-22: qty 15

## 2015-06-22 MED ORDER — PREDNISONE 20 MG PO TABS
30.0000 mg | ORAL_TABLET | Freq: Every day | ORAL | Status: DC
  Administered 2015-06-23 – 2015-06-24 (×2): 30 mg via ORAL
  Filled 2015-06-22 (×2): qty 1

## 2015-06-22 NOTE — Progress Notes (Signed)
[facility] Progress Note   Phillip Kelley  LOV:[id number]  DOB: June 09, 1927  DOA: 06/17/2015 PCP: [name], MD  Brief narrative: BERNON SURTI is a 81 y.o. 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 He was started on a course of doxycycline by his PCP. He took 2 days of this and called the office on 11/20 to tell his PCP that the arms were now swollen. He was then recommended to come to the hospital. In the ER he was noted to have acute on chronic renal failure and he was therefore referred for admission.   Subjective: Still has sore throat but not severe. No cough- no dyspnea today. Left arm still swollen   Assessment/Plan: Principal Problem:   Acute renal failure superimposed on stage 3 chronic kidney disease -Suspected to be volume depleted from being on furosemide has been discontinued -IV fluids given short term -Obtained urine sodium, urine creatinine and urine osmolality- renal failure appears to be ATN - Cr improving but rose slightly today due to Lasix - follow  Active Problems:  Shortness of breath Systolic and diastolic heart failure  -  ECHO reveals EF of 45-50% and grade 2 Diastolic dysfunction - he has focal hypokinesis as well -Lasix has been on hold due to acute renal failure from ATN-chest x-ray yesterday showed mild pulmonary edema - no obvious dyspnea noted while at rest-pulse ox is 92-93% on room air - given lasix yesterday with improvement- will hold lasix today as Cr rising-   Rash on arms and legs - received 2 days of Doxycycline but rash worsened and swelling started - cont oral steroids, Vistaril, clilndamycin - d/c Diflucan - contacted dermatology- Dr Yetta Barre feels this is  a dermatitis, possibly a contact dermatitis- recommending a 10-14 day Prednisone taper, cont Eucerin/ Triamcinolone and and antibiotic to treat bacterial superinfection - appears to be steadily improving- patient admits to using various different cream on his skin and may have easily developed a contact dermatitis -family tells me today that this rash is a recurrent issue that his dermatologist gives him a Prednisone taper every time it flares up- his dermatologist is not aware of the etiology for the rash - rash has improved considerably-  Left arm swelling - arm still swollen- obtain venous duplex     Gait abnormality/ frequent falls - PT eval - will need SNF    Pacemaker-BiV Medtronic    Atrial fibrillation  - cont Amiodarone and Aspirin- not candidate for anticoagulation due to falls    Alcohol abuse - no signs of withdrawal    Hypertension - cont Hydralazine  BPH - cont Flomax  Code Status:     Code Status Orders        Start     Ordered   06/17/15 1657  Full code   Continuous     06/17/15 1656    Advance Directive Documentation        Most Recent Value   Type of Advance Directive  Healthcare Power of Attorney Mariane Baumgarten (daughter)]   Pre-existing out of facility DNR order (yellow form or pink MOST form)     "MOST" Form in Place?       Family Communication:  lengthy conversation with wife and daughter on  12/3 Disposition Plan: SNF search DVT prophylaxis: SCDs Consultants: Procedures: 2 D ECHO  Left ventricle: The cavity size was normal. Wall thickness was normal. Systolic function was mildly reduced. The estimated ejection fraction was in the range of 45% to 50%. Hypokinesis of the basal-midinferolateral and inferior myocardium. Features are consistent with a pseudonormal left ventricular filling pattern, with concomitant abnormal relaxation and increased filling pressure (grade 2 diastolic dysfunction). - Ventricular septum: Septal  motion showed paradox. These changes are consistent with right ventricular pacing. - Aortic valve: There was trivial regurgitation. - Mitral valve: Calcified annulus. There was mild regurgitation. - Left atrium: The atrium was severely dilated. - Atrial septum: No defect or patent foramen ovale was identified. - Pulmonary arteries: Systolic pressure was mildly increased. PA peak pressure: 41 mm Hg (S).  Antibiotics: Anti-infectives    Start     Dose/Rate Route Frequency Ordered Stop   06/17/15 2100  fluconazole (DIFLUCAN) tablet 150 mg  Status:  Discontinued     150 mg Oral Daily 06/17/15 2048 06/17/15 2056   06/17/15 2100  fluconazole (DIFLUCAN) tablet 50 mg  Status:  Discontinued     50 mg Oral Every 24 hours 06/17/15 2057 06/19/15 1251   06/17/15 2000  clindamycin (CLEOCIN) capsule 300 mg     300 mg Oral 4 times per day 06/17/15 1656 06/22/15 1304   06/17/15 1430  clindamycin (CLEOCIN) IVPB 600 mg     600 mg 100 mL/hr over 30 Minutes Intravenous  Once 06/17/15 1428 06/17/15 1608      Objective: Filed Weights   06/20/15 0505 06/21/15 0332 06/22/15 0539  Weight: 68.4 kg (150 lb 12.7 oz) 66.452 kg (146 lb 8 oz) 64.365 kg (141 lb 14.4 oz)    Intake/Output Summary (Last 24 hours) at 06/22/15 1529 Last data filed at 06/22/15 0957  Gross per 24 hour  Intake    880 ml  Output    100 ml  Net    780 ml     Vitals Filed Vitals:   06/22/15 0600 06/22/15 0645 06/22/15 0946 06/22/15 1200  BP: 172/81 136/65 121/52   Pulse:  74    Temp:      TempSrc:      Resp:      Height:     (1.651 m)  Weight:      SpO2:  94%      Exam:  General:  Pt is alert, not in acute distress  HEENT: No icterus, No thrush, oral mucosa moist  Cardiovascular: regular rate and rhythm, S1/S2 No murmur  Respiratory: clear to auscultation bilaterally   Abdomen: Soft, +Bowel sounds, non tender, non distended, no guarding  MSK: No LE edema, cyanosis or clubbing  Skin: rash with bruising  in some areas on arms and back with flaky dry skin- scattered areas of broken skin and bleeding are healing           Data Reviewed: Basic Metabolic Panel:  Recent Labs Lab 06/18/15 0547 06/19/15 0835 06/20/15 0828 06/21/15 0615 06/22/15 0610  NA 143 143 141 142 142  K 4.0 4.8 4.5 4.3 4.4  CL 111 111 108 111 109  CO2 GLUCOSE 74 91 121* 93 95  BUN 38* 34* 35* 35* 38*  CREATININE 2.42* 2.12* 2.02* 1.89* 1.93*  CALCIUM 8.7* 8.9 8.6* 8.7* 8.6*   Liver Function Tests: No results for input(s): AST, ALT, ALKPHOS, BILITOT, PROT, ALBUMIN in the last 168 hours. No results for input(s):  LIPASE, AMYLASE in the last 168 hours. No results for input(s): AMMONIA in the last 168 hours. CBC:  Recent Labs Lab 06/17/15 1315 06/18/15 0547 06/19/15 0835 06/22/15 1001  WBC 10.1 8.1 10.4 12.2*  NEUTROABS 6.8  --   --   --   HGB 9.8* 8.3* 8.9* 8.9*  HCT 29.6* 25.3* 26.9* 27.5*  MCV 95.8 94.8 94.1 94.5  PLT 124* 106* 109* 142*   Cardiac Enzymes: No results for input(s): CKTOTAL, CKMB, CKMBINDEX, TROPONINI in the last 168 hours. BNP (last 3 results)  Recent Labs  12/05/14 1437 06/20/15 1915 06/22/15 1001  BNP 697.0* 2406.3* 1473.9*    ProBNP (last 3 results) No results for input(s): PROBNP in the last 8760 hours.  CBG: No results for input(s): GLUCAP in the last 168 hours.  No results found for this or any previous visit (from the past 240 hour(s)).   Studies: Dg Chest Port 1 View  06/22/2015  CLINICAL DATA:  Hypoxia this morning.  No chest pain. EXAM: PORTABLE CHEST 1 VIEW COMPARISON:  06/21/2015 FINDINGS: Low lung volumes with bibasilar opacities, likely atelectasis. Small left pleural effusion is stable. Mild cardiomegaly. No overt edema currently. Left pacer remains in place, unchanged. IMPRESSION: Low lung volumes with bibasilar atelectasis. Stable small left effusion. Improving edema pattern with no current evidence for failure. Electronically Signed    By: Charlett Nose M.D.   On: 06/22/2015 10:04   Dg Chest Port 1 View  06/21/2015  CLINICAL DATA:  Dyspnea EXAM: PORTABLE CHEST 1 VIEW COMPARISON:  12/05/2014 chest radiograph. FINDINGS: Stable configuration of 3 lead left subclavian pacemaker. Stable cardiomediastinal silhouette with mild cardiomegaly. No pneumothorax. Possible small left pleural effusion. Low lung volumes. Mild pulmonary edema. Bibasilar lung opacities likely represent atelectasis. Severe degenerative changes in the bilateral glenohumeral joints. IMPRESSION: 1. Mild cardiomegaly and mild pulmonary edema, most in keeping with mild congestive heart failure. 2. Low lung volumes with bibasilar atelectasis. 3. Likely small left pleural effusion. Electronically Signed   By: Delbert Phenix M.D.   On: 06/21/2015 12:49    Scheduled Meds:  Scheduled Meds: . amiodarone  100 mg Oral Daily  . aspirin EC  81 mg Oral Daily  . docusate sodium  100 mg Oral BID  . folic acid  1 mg Oral Daily  . guaiFENesin  600 mg Oral BID  . hydrALAZINE  25 mg Oral BID  . levothyroxine  50 mcg Oral QAC breakfast  . multivitamin with minerals  1 tablet Oral Daily  . oxymetazoline  1 spray Each Nare BID  . [START ON 06/23/2015] predniSONE  30 mg Oral Q breakfast  . tamsulosin  0.4 mg Oral BID  . thiamine  100 mg Oral Daily  . triamcinolone 0.1 % cream : eucerin   Topical BID   Continuous Infusions:   Time spent on care of this patient: 35 min   Roshawna Colclasure, MD 06/22/2015, 3:29 PM  LOS: 5 days   [facility] Office  [phone] Pager - Text Page per [email or web address] If 7PM-7AM, please contact night-coverage [email or web address]

## 2015-06-22 NOTE — Care Management Note (Signed)
Case Management Note  Patient Details  Name: Phillip Kelley MRN: [id number] Date of Birth: 07/09/1927  Subjective/Objective:                  Patient admitted with A/C Renal Failure. Anticipate DC to SNF in 1-2 days. Following Cr, and swollen upper extremity to r/o DVT.    Action/Plan:  DC to SNF as facilitated by CSW when medically stable.   Expected Discharge Date:                  Expected Discharge Plan:  Home w Home Health Services  In-House Referral:     Discharge planning Services  CM Consult  Post Acute Care Choice:  Resumption of Svcs/PTA Provider Choice offered to:  Patient  DME Arranged:    DME Agency:     HH Arranged:  RN, PT, Nurse's Aide, Social Work [employer] Agency:     Status of Service:     [employer] Given:  Yes Date Medicare IM Given:    Medicare IM give by:    Date Additional Medicare IM Given:    Additional Medicare Important Message give by:     If discussed at Long Length of Stay Meetings, dates discussed:    Additional Comments:  Lawerance Sabal, RN 06/22/2015, 3:08 PM

## 2015-06-22 NOTE — Progress Notes (Signed)
Physical Therapy Treatment Patient Details Name: Phillip Kelley MRN: [id number] DOB: [birth year] Today's Date: 06-28-2015    History of Present Illness Pt adm with acute on chronic renal failure and bilateral arm and back rash. PMH - CHF, HTN, etoh abuse, pacer, afib    PT Comments    Pt continues to require extensive assist for mobility. Pt now agreeable to SNF.  Follow Up Recommendations  SNF     Equipment Recommendations  None recommended by PT    Recommendations for Other Services OT consult     Precautions / Restrictions Precautions Precautions: Fall Restrictions Weight Bearing Restrictions: No    Mobility  Bed Mobility Overal bed mobility: Needs Assistance Bed Mobility: Supine to Sit     Supine to sit: Mod assist     General bed mobility comments: Assist to bring shoulders up  Transfers Overall transfer level: Needs assistance Equipment used: 4-wheeled walker Transfers: Sit to/from Stand Sit to Stand: Mod assist;+2 physical assistance         General transfer comment: Assist to bring hips up and for balance  Ambulation/Gait Ambulation/Gait assistance: Mod assist;+2 safety/equipment Ambulation Distance (Feet): 30 Feet Assistive device: 4-wheeled walker Gait Pattern/deviations: Step-to pattern;Decreased step length - right;Decreased step length - left;Shuffle;Leaning posteriorly;Trunk flexed Gait velocity: decr Gait velocity interpretation: Below normal speed for age/gender General Gait Details: Assist for balance and support. Posterior lean.Verbal cues to stand more erect. Pt with flexed knees throughout. Second person followed closely with recliner   Stairs            Wheelchair Mobility    Modified Rankin (Stroke Patients Only)       Balance   Sitting-balance support: No upper extremity supported;Feet supported Sitting balance-Leahy Scale: Fair     Standing balance support: Bilateral upper extremity supported Standing  balance-Leahy Scale: Poor Standing balance comment: rollator and mod A for static                    Cognition Arousal/Alertness: Awake/alert Behavior During Therapy: WFL for tasks assessed/performed Overall Cognitive Status: No family/caregiver present to determine baseline cognitive functioning Area of Impairment: Orientation;Memory;Problem solving     Memory: Decreased short-term memory       Problem Solving: Slow processing;Requires verbal cues;Requires tactile cues      Exercises      General Comments        Pertinent Vitals/Pain Pain Assessment: No/denies pain    Home Living                      Prior Function            PT Goals (current goals can now be found in the care plan section) Acute Rehab PT Goals Patient Stated Goal: return home Progress towards PT goals: Progressing toward goals    Frequency  Min 2X/week    PT Plan Discharge plan needs to be updated;Frequency needs to be updated    Co-evaluation             End of Session Equipment Utilized During Treatment: Gait belt Activity Tolerance: Patient limited by fatigue Patient left: in chair;with call bell/phone within reach;with chair alarm set     Time: 1519-1536 PT Time Calculation (min) (ACUTE ONLY): 17 min  Charges:  $Gait Training: 8-22 mins                    G Codes:      Phillip Kelley 06-28-15, 4:05 PM [employer]  Phillip Kelley

## 2015-06-23 ENCOUNTER — Inpatient Hospital Stay (HOSPITAL_COMMUNITY): Payer: Medicare Other

## 2015-06-23 DIAGNOSIS — L8990 Pressure ulcer of unspecified site, unspecified stage: Secondary | ICD-10-CM | POA: Insufficient documentation

## 2015-06-23 DIAGNOSIS — R609 Edema, unspecified: Secondary | ICD-10-CM

## 2015-06-23 LAB — BASIC METABOLIC PANEL
ANION GAP: 7 (ref 5–15)
BUN: 37 mg/dL — ABNORMAL HIGH (ref 6–20)
CALCIUM: 8.7 mg/dL — AB (ref 8.9–10.3)
CO2: 27 mmol/L (ref 22–32)
CREATININE: 1.78 mg/dL — AB (ref 0.61–1.24)
Chloride: 109 mmol/L (ref 101–111)
GFR, EST AFRICAN AMERICAN: 38 mL/min — AB (ref >60)
GFR, EST NON AFRICAN AMERICAN: 33 mL/min — AB (ref >60)
Glucose, Bld: 84 mg/dL (ref 65–99)
Potassium: 4.6 mmol/L (ref 3.5–5.1)
SODIUM: 143 mmol/L (ref 135–145)

## 2015-06-23 LAB — CBC
HCT: 27.5 % — ABNORMAL LOW (ref 39.0–52.0)
HEMOGLOBIN: 8.7 g/dL — AB (ref 13.0–17.0)
MCH: 30.1 pg (ref 26.0–34.0)
MCHC: 31.6 g/dL (ref 30.0–36.0)
MCV: 95.2 fL (ref 78.0–100.0)
PLATELETS: 131 10*3/uL — AB (ref 150–400)
RBC: 2.89 MIL/uL — AB (ref 4.22–5.81)
RDW: 16.4 % — ABNORMAL HIGH (ref 11.5–15.5)
WBC: 12.2 10*3/uL — ABNORMAL HIGH (ref 4.0–10.5)

## 2015-06-23 MED ORDER — APIXABAN 5 MG PO TABS: 5.0000 mg | ORAL_TABLET | Freq: Two times a day (BID) | ORAL | Status: DC

## 2015-06-23 MED ORDER — FUROSEMIDE 10 MG/ML IJ SOLN
40.0000 mg | Freq: Once | INTRAMUSCULAR | Status: AC
  Administered 2015-06-23: 40 mg via INTRAVENOUS
  Filled 2015-06-23: qty 4

## 2015-06-23 MED ORDER — APIXABAN 5 MG PO TABS
10.0000 mg | ORAL_TABLET | Freq: Two times a day (BID) | ORAL | Status: DC
  Administered 2015-06-23 – 2015-06-24 (×3): 10 mg via ORAL
  Filled 2015-06-23 (×3): qty 2

## 2015-06-23 MED ORDER — DEXTROSE 5 % IV SOLN
500.0000 mg | INTRAVENOUS | Status: DC
  Administered 2015-06-23: 500 mg via INTRAVENOUS
  Filled 2015-06-23: qty 500

## 2015-06-23 NOTE — Progress Notes (Signed)
ANTICOAGULATION CONSULT NOTE - Initial Consult  Pharmacy Consult for Eliquis Indication: DVT  Allergies  Allergen Reactions  . Oxycodone Other (See Comments)    Pt was VERY confused and didn't know where he was at  . Penicillins Rash  . Sulfa Antibiotics Rash  . Losartan Other (See Comments)    Hyperkalemia     Patient Measurements: Height: 5\' 5"  (165.1 cm) Weight: 142 lb 8 oz (64.638 kg) IBW/kg (Calculated) : 61.5  Vital Signs: Temp: 98 F (36.7 C) (12/06 1028) Temp Source: Oral (12/06 1028) BP: 120/71 mmHg (12/06 1028) Pulse Rate: 68 (12/06 1028)  Labs:  Recent Labs  06/21/15 0615 06/22/15 0610 06/22/15 1001 06/23/15 0923  HGB  --   --  8.9* 8.7*  HCT  --   --  27.5* 27.5*  PLT  --   --  142* 131*  CREATININE 1.89* 1.93*  --  1.78*    Estimated Creatinine Clearance: 25.9 mL/min (by C-G formula based on Cr of 1.78).   Medical History: Past Medical History  Diagnosis Date  . Cataract   . Hypertension   . Chronic diastolic CHF (congestive heart failure) (HCC)     a. 09/2014 Echo: EF 65-70%, sev LVH, Gr 1 DD, mod dil LA.  Marland Kitchen Symptomatic bradycardia     a. 01/2012 s/p Medtronic BiV PPM  . Anemia   . History of blood transfusion 2001    following hip surgery.  . Arthritis   . Chronic lower back pain   . Leg edema   . History of DVT (deep vein thrombosis)   . NICM (nonischemic cardiomyopathy) (HCC)     a. 09/2014 Echo: EF 65-70%.  . Gait instability   . ETOH abuse   . PAF (paroxysmal atrial fibrillation) (HCC)     a. No OAC 2/2 unsteady gait/falls.    Assessment: 81 year old male beginning Eliquis for a RUE DVT History of falls   Goal of Therapy:  Appropriate dosing Monitor platelets by anticoagulation protocol: Yes   Plan:  Eliquis 10 mg po BID x 7 days then 5 mg po BID Follow up CBC  Thank you Okey Regal, PharmD [phone]  06/23/2015,1:15 PM

## 2015-06-23 NOTE — Progress Notes (Signed)
[facility] Progress Note   Phillip Kelley  WUJ:[id number]  DOB: 03-18-1927  DOA: 06/17/2015 PCP: [name], MD  Brief narrative: Phillip Kelley is a 81 y.o. 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 His arms have developed this rash in the past but now they are quite swollen as well.  He was started on a course of doxycycline by his PCP. He took 2 days of this and called the office on 11/20 to tell his PCP that the arms were even more swollen. He was then recommended to come to the hospital. In the ER he was noted to have acute on chronic renal failure and he was therefore referred for admission.   Subjective: Has a cough now. Sore throat is not as bad. Has just coughed up yellow green sputum. No fever. No chest pain    Assessment/Plan: Principal Problem:   Acute renal failure superimposed on stage 3 chronic kidney disease -Suspected to be volume depleted from being on furosemide  - also suspect fluid shift from swelling in arm (in relation to severe dermatitis) caused further intravascular volume depletion -IV fluids given short term -Obtained urine sodium, urine creatinine and urine osmolality- renal failure appears to be ATN - Cr improving but rose slightly due to Lasix - follow  Active Problems:  Shortness of breath Systolic and diastolic heart failure  - new finding on ECHO- EF of 45-50% and grade 2 Diastolic dysfunction - he has focal hypokinesis as well - difficult situation-Lasix has been on hold due to acute renal failure from ATN- he later complained of dyspnea and chest x-ray  showed mild pulm edema-- given lasix on 9/3 with improvement- lasix held on 9/4 due to rise in Cr - given another  dose today- follow Cr carefully - cardiology recommending to continue Lasix on d/c  Rash on arms and legs - see pictures below - received 2 days of Doxycycline but rash worsened and swelling started - last dose of Clinda given today for superimposed cellulitis - contacted dermatology- Dr Yetta Barre feels this is a dermatitis, possibly a contact dermatitis- recommending a 10-14 day Prednisone taper, cont Eucerin/ Triamcinolone and and antibiotic to treat bacterial superinfection - appears to be steadily improving- patient admits to using various different cream on his skin and may have easily developed a contact dermatitis -family tells me today that this rash is a recurrent issue that his dermatologist gives him a Prednisone taper every time it flares up- his dermatologist is not aware of the etiology for the rash - rash has improved considerably-  Left arm swelling - left arm still swollen although rash is resolving - obtained venous duplex today- found to have DVT- despite the fact that he is a fall risk, now need to start anticoagulation- started Eliquis  Cough/ yellow sputum - acute bronchitis?- Z-pak started today    Gait abnormality/ frequent falls - PT eval - will need SNF    Pacemaker-BiV Med[employer]    Atrial fibrillation  - cont Amiodarone- was not candidate for anticoagulation due to falls but Eliquis now started due to DVT-     Alcohol abuse - no signs of withdrawal    Hypertension - cont Hydralazine  BPH - cont Flomax  Dementia - clearly  has short term memory problems  Code Status:     Code Status Orders        Start     Ordered   06/17/15 1657  Full code   Continuous     06/17/15 1656    Advance Directive Documentation        Most Recent Value   Type of Advance Directive  Healthcare Power of Attorney Mariane Baumgarten (daughter)]   Pre-existing out of facility DNR order (yellow form or pink MOST form)     "MOST" Form in Place?       Family Communication:   lengthy conversation with wife and daughter on 12/3 Disposition Plan: SNF search DVT prophylaxis: SCDs Consultants: Procedures: 2 D ECHO  Left ventricle: The cavity size was normal. Wall thickness was normal. Systolic function was mildly reduced. The estimated ejection fraction was in the range of 45% to 50%. Hypokinesis of the basal-midinferolateral and inferior myocardium. Features are consistent with a pseudonormal left ventricular filling pattern, with concomitant abnormal relaxation and increased filling pressure (grade 2 diastolic dysfunction). - Ventricular septum: Septal motion showed paradox. These changes are consistent with right ventricular pacing. - Aortic valve: There was trivial regurgitation. - Mitral valve: Calcified annulus. There was mild regurgitation. - Left atrium: The atrium was severely dilated. - Atrial septum: No defect or patent foramen ovale was identified. - Pulmonary arteries: Systolic pressure was mildly increased. PA peak pressure: 41 mm Hg (S).  Antibiotics: Anti-infectives    Start     Dose/Rate Route Frequency Ordered Stop   06/23/15 1330  azithromycin (ZITHROMAX) 500 mg in dextrose 5 % 250 mL IVPB     500 mg 250 mL/hr over 60 Minutes Intravenous Every 24 hours 06/23/15 1232     06/17/15 2100  fluconazole (DIFLUCAN) tablet 150 mg  Status:  Discontinued     150 mg Oral Daily 06/17/15 2048 06/17/15 2056   06/17/15 2100  fluconazole (DIFLUCAN) tablet 50 mg  Status:  Discontinued     50 mg Oral Every 24 hours 06/17/15 2057 06/19/15 1251   06/17/15 2000  clindamycin (CLEOCIN) capsule 300 mg     300 mg Oral 4 times per day 06/17/15 1656 06/22/15 1304   06/17/15 1430  clindamycin (CLEOCIN) IVPB 600 mg     600 mg 100 mL/hr over 30 Minutes Intravenous  Once 06/17/15 1428 06/17/15 1608      Objective: Filed Weights   06/21/15 0332 06/22/15 0539 06/23/15 0500  Weight: 66.452 kg (146 lb 8 oz) 64.365 kg (141 lb 14.4 oz) 64.638 kg (142  lb 8 oz)    Intake/Output Summary (Last 24 hours) at 06/23/15 2113 Last data filed at 06/23/15 1824  Gross per 24 hour  Intake    470 ml  Output   1650 ml  Net  -1180 ml     Vitals Filed Vitals:   06/23/15 0907 06/23/15 1028 06/23/15 1457 06/23/15 2109  BP: 161/73 120/71 124/68 136/63  Pulse:  68 69 71  Temp:  98 F (36.7 C)  97.9 F (36.6 C)  TempSrc:  Oral  Oral  Resp:  16 18   Height:      Weight:      SpO2:  98% 100% 99%    Exam:  General:  Pt is alert, not in acute distress  HEENT: No icterus, No thrush, oral mucosa moist  Cardiovascular: regular rate and rhythm, S1/S2 No murmur  Respiratory: clear to auscultation bilaterally   Abdomen: Soft, +Bowel sounds, non  tender, non distended, no guarding  MSK: No LE edema, cyanosis or clubbing  Skin: rash on arms and back significantly improved             Data Reviewed: Basic Metabolic Panel:  Recent Labs Lab 06/19/15 0835 06/20/15 0828 06/21/15 0615 06/22/15 0610 06/23/15 0923  NA 143 141 142 142 143  K 4.8 4.5 4.3 4.4 4.6  CL 111 108 111 109 109  CO2 GLUCOSE 91 121* 93 95 84  BUN 34* 35* 35* 38* 37*  CREATININE 2.12* 2.02* 1.89* 1.93* 1.78*  CALCIUM 8.9 8.6* 8.7* 8.6* 8.7*   Liver Function Tests: No results for input(s): AST, ALT, ALKPHOS, BILITOT, PROT, ALBUMIN in the last 168 hours. No results for input(s): LIPASE, AMYLASE in the last 168 hours. No results for input(s): AMMONIA in the last 168 hours. CBC:  Recent Labs Lab 06/17/15 1315 06/18/15 0547 06/19/15 0835 06/22/15 1001 06/23/15 0923  WBC 10.1 8.1 10.4 12.2* 12.2*  NEUTROABS 6.8  --   --   --   --   HGB 9.8* 8.3* 8.9* 8.9* 8.7*  HCT 29.6* 25.3* 26.9* 27.5* 27.5*  MCV 95.8 94.8 94.1 94.5 95.2  PLT 124* 106* 109* 142* 131*   Cardiac Enzymes: No results for input(s): CKTOTAL, CKMB, CKMBINDEX, TROPONINI in the last 168 hours. BNP (last 3 results)  Recent Labs  12/05/14 1437 06/20/15 1915  06/22/15 1001  BNP 697.0* 2406.3* 1473.9*    ProBNP (last 3 results) No results for input(s): PROBNP in the last 8760 hours.  CBG: No results for input(s): GLUCAP in the last 168 hours.  No results found for this or any previous visit (from the past 240 hour(s)).   Studies: Dg Chest Port 1 View  06/22/2015  CLINICAL DATA:  Hypoxia this morning.  No chest pain. EXAM: PORTABLE CHEST 1 VIEW COMPARISON:  06/21/2015 FINDINGS: Low lung volumes with bibasilar opacities, likely atelectasis. Small left pleural effusion is stable. Mild cardiomegaly. No overt edema currently. Left pacer remains in place, unchanged. IMPRESSION: Low lung volumes with bibasilar atelectasis. Stable small left effusion. Improving edema pattern with no current evidence for failure. Electronically Signed   By: Charlett Nose M.D.   On: 06/22/2015 10:04    Scheduled Meds:  Scheduled Meds: . amiodarone  100 mg Oral Daily  . apixaban  10 mg Oral BID  . [START ON 06/30/2015] apixaban  5 mg Oral BID  . aspirin EC  81 mg Oral Daily  . azithromycin  500 mg Intravenous Q24H  . docusate sodium  100 mg Oral BID  . folic acid  1 mg Oral Daily  . guaiFENesin  600 mg Oral BID  . hydrALAZINE  25 mg Oral BID  . levothyroxine  50 mcg Oral QAC breakfast  . multivitamin with minerals  1 tablet Oral Daily  . oxymetazoline  1 spray Each Nare BID  . predniSONE  30 mg Oral Q breakfast  . tamsulosin  0.4 mg Oral BID  . thiamine  100 mg Oral Daily  . triamcinolone 0.1 % cream : eucerin   Topical BID   Continuous Infusions:   Time spent on care of this patient: 35 min   Mishaal Lansdale, MD 06/23/2015, 9:13 PM  LOS: 6 days   [facility] Office  [phone] Pager - Text Page per [email or web address] If 7PM-7AM, please contact night-coverage [email or web address]

## 2015-06-23 NOTE — Progress Notes (Signed)
MD notified of positive DVT in Left arm.

## 2015-06-23 NOTE — Progress Notes (Signed)
*  Preliminary Results* Left upper extremity venous duplex completed. Left upper extremity is positive for acute deep vein thrombosis involving the left brachial vein.  Preliminary results discussed with Hansel Starling, RN.  06/23/2015 8:59 AM  Gertie Fey, RVT, RDCS, RDMS

## 2015-06-23 NOTE — Discharge Instructions (Signed)
Information on my medicine - ELIQUIS (apixaban)  This medication education was reviewed with me or my healthcare representative as part of my discharge preparation.  The pharmacist that spoke with me during my hospital stay was:  Evaleigh Mccamy Kay, RPH  Why was Eliquis prescribed for you? Eliquis was prescribed to treat blood clots that may have been found in the veins of your legs (deep vein thrombosis) or in your lungs (pulmonary embolism) and to reduce the risk of them occurring again.  What do You need to know about Eliquis ? The starting dose is 10 mg (two 5 mg tablets) taken TWICE daily for the FIRST SEVEN (7) DAYS, then  the dose is reduced to ONE 5 mg tablet taken TWICE daily.  Eliquis may be taken with or without food.   Try to take the dose about the same time in the morning and in the evening. If you have difficulty swallowing the tablet whole please discuss with your pharmacist how to take the medication safely.  Take Eliquis exactly as prescribed and DO NOT stop taking Eliquis without talking to the doctor who prescribed the medication.  Stopping may increase your risk of developing a new blood clot.  Refill your prescription before you run out.  After discharge, you should have regular check-up appointments with your healthcare provider that is prescribing your Eliquis.    What do you do if you miss a dose? If a dose of ELIQUIS is not taken at the scheduled time, take it as soon as possible on the same day and twice-daily administration should be resumed. The dose should not be doubled to make up for a missed dose.  Important Safety Information A possible side effect of Eliquis is bleeding. You should call your healthcare provider right away if you experience any of the following: Bleeding from an injury or your nose that does not stop. Unusual colored urine (red or dark brown) or unusual colored stools (red or black). Unusual bruising for unknown reasons. A serious  fall or if you hit your head (even if there is no bleeding).  Some medicines may interact with Eliquis and might increase your risk of bleeding or clotting while on Eliquis. To help avoid this, consult your healthcare provider or pharmacist prior to using any new prescription or non-prescription medications, including herbals, vitamins, non-steroidal anti-inflammatory drugs (NSAIDs) and supplements.  This website has more information on Eliquis (apixaban): http://www.eliquis.com/eliquis/home  

## 2015-06-24 DIAGNOSIS — R21 Rash and other nonspecific skin eruption: Secondary | ICD-10-CM

## 2015-06-24 DIAGNOSIS — I482 Chronic atrial fibrillation: Secondary | ICD-10-CM

## 2015-06-24 DIAGNOSIS — I82409 Acute embolism and thrombosis of unspecified deep veins of unspecified lower extremity: Secondary | ICD-10-CM

## 2015-06-24 LAB — BASIC METABOLIC PANEL
ANION GAP: 5 (ref 5–15)
BUN: 41 mg/dL — ABNORMAL HIGH (ref 6–20)
CHLORIDE: 104 mmol/L (ref 101–111)
CO2: 30 mmol/L (ref 22–32)
Calcium: 8.3 mg/dL — ABNORMAL LOW (ref 8.9–10.3)
Creatinine, Ser: 1.98 mg/dL — ABNORMAL HIGH (ref 0.61–1.24)
GFR calc non Af Amer: 29 mL/min — ABNORMAL LOW (ref >60)
GFR, EST AFRICAN AMERICAN: 33 mL/min — AB (ref >60)
Glucose, Bld: 90 mg/dL (ref 65–99)
POTASSIUM: 4.6 mmol/L (ref 3.5–5.1)
Sodium: 139 mmol/L (ref 135–145)

## 2015-06-24 MED ORDER — AZITHROMYCIN 250 MG PO TABS: 250.0000 mg | ORAL_TABLET | Freq: Every day | ORAL | Status: AC

## 2015-06-24 MED ORDER — APIXABAN 5 MG PO TABS: 10.0000 mg | ORAL_TABLET | Freq: Two times a day (BID) | ORAL | Status: AC

## 2015-06-24 MED ORDER — TRIAMCINOLONE 0.1 % CREAM:EUCERIN CREAM 1:1: 1.0000 "application " | TOPICAL_CREAM | Freq: Two times a day (BID) | CUTANEOUS | Status: AC

## 2015-06-24 MED ORDER — APIXABAN 5 MG PO TABS: 5.0000 mg | ORAL_TABLET | Freq: Two times a day (BID) | ORAL | Status: AC

## 2015-06-24 MED ORDER — PREDNISONE 10 MG PO TABS
ORAL_TABLET | ORAL | Status: DC
Start: 2015-06-24 — End: 2015-07-25

## 2015-06-24 MED ORDER — DOCUSATE SODIUM 100 MG PO CAPS: 100.0000 mg | ORAL_CAPSULE | Freq: Two times a day (BID) | ORAL | Status: AC

## 2015-06-24 MED ORDER — AZITHROMYCIN 500 MG PO TABS
250.0000 mg | ORAL_TABLET | Freq: Every day | ORAL | Status: DC
  Administered 2015-06-24: 250 mg via ORAL
  Filled 2015-06-24: qty 1

## 2015-06-24 NOTE — Clinical Social Work Placement (Signed)
   CLINICAL SOCIAL WORK PLACEMENT  NOTE  Date:  06/24/2015  Patient Details  Name: Phillip Kelley MRN: [id number] Date of Birth: 1926/10/24  Clinical Social Work is seeking post-discharge placement for this patient at the Skilled  Nursing Facility level of care (*CSW will initial, date and re-position this form in  chart as items are completed):  Yes   Patient/family provided with [place] Clinical Social Work Department's list of facilities offering this level of care within the geographic area requested by the patient (or if unable, by the patient's family).  Yes   Patient/family informed of their freedom to choose among providers that offer the needed level of care, that participate in Medicare, Medicaid or managed care program needed by the patient, have an available bed and are willing to accept the patient.  Yes   Patient/family informed of [place] [facility]'s ownership interest in [facility] and [facility], as well as of the fact that they are under no obligation to receive care at these facilities.  PASRR submitted to EDS on 06/19/15     PASRR number received on 06/19/15     Existing PASRR number confirmed on       FL2 transmitted to all facilities in geographic area requested by pt/family on 06/19/15     FL2 transmitted to all facilities within larger geographic area on       Patient informed that his/her managed care company has contracts with or will negotiate with certain facilities, including the following:        Yes   Patient/family informed of bed offers received.  Patient chooses bed at [facility]     Physician recommends and patient chooses bed at      Patient to be transferred to [facility] on 06/24/15.  Patient to be transferred to facility by [facility]     Patient family notified on 06/24/15 of transfer.  Name of family member notified:  Daughter, Toni Amend, and left message for wife     PHYSICIAN Please sign FL2, Please prepare  prescriptions     Additional Comment:    _______________________________________________ Mearl Latin, LCSWA 06/24/2015, 1:43 PM

## 2015-06-24 NOTE — Progress Notes (Addendum)
Physical Therapy Treatment Patient Details Name: Phillip Kelley MRN: [id number] DOB: 1927/03/27 Today's Date: 06/24/2015    History of Present Illness Pt adm with acute on chronic renal failure and bilateral arm and back rash. PMH - CHF, HTN, etoh abuse, pacer, afib    PT Comments    Performed sit to stand from recliner x 3 for strengthening, balance and mobility training. Min assist required to maintain static standing balance and dynamic balance with marching in place. Pt fatigues quickly requiring return to sit. Continue with current POC.  Follow Up Recommendations  SNF     Equipment Recommendations  None recommended by PT    Recommendations for Other Services       Precautions / Restrictions Precautions Precautions: Fall Restrictions Weight Bearing Restrictions: No    Mobility  Bed Mobility               General bed mobility comments: Pt up in recliner upon arrival and remained in recliner following Rx.  Transfers   Equipment used: Rolling walker (2 wheeled)   Sit to Stand: Max assist         General transfer comment: assist to power up and shift weight anteriorly  Ambulation/Gait                 Stairs            Wheelchair Mobility    Modified Rankin (Stroke Patients Only)       Balance           Standing balance support: During functional activity;Bilateral upper extremity supported Standing balance-Leahy Scale: Poor                      Cognition Arousal/Alertness: Awake/alert Behavior During Therapy: WFL for tasks assessed/performed Overall Cognitive Status: No family/caregiver present to determine baseline cognitive functioning Area of Impairment: Memory     Memory: Decreased short-term memory              Exercises General Exercises - Lower Extremity Long Arc Quad: AROM;Right;Left;10 reps;Seated Hip ABduction/ADduction: AROM;Right;Left;10 reps;Seated Hip Flexion/Marching: AROM;Right;Left;10  reps;Standing (min assist to maintain balance)    General Comments        Pertinent Vitals/Pain Pain Assessment: No/denies pain    Home Living                      Prior Function            PT Goals (current goals can now be found in the care plan section) Acute Rehab PT Goals Patient Stated Goal: return home PT Goal Formulation: With patient Time For Goal Achievement: 07/02/15 Potential to Achieve Goals: Fair Progress towards PT goals: Progressing toward goals    Frequency  Min 2X/week    PT Plan Current plan remains appropriate    Co-evaluation             End of Session Equipment Utilized During Treatment: Gait belt Activity Tolerance: Patient limited by fatigue Patient left: in chair;with call bell/phone within reach;with chair alarm set     Time: 1005-1032 PT Time Calculation (min) (ACUTE ONLY): 27 min  Charges:  $Gait Training: 8-22 mins $Therapeutic Exercise: 8-22 mins                    G Codes:      Ilda Foil 06/24/2015, 11:38 AM

## 2015-06-24 NOTE — Progress Notes (Signed)
Patient will DC to: [facility] Anticipated DC date: 06/24/15 Family notified: Daughter, Toni Amend Transport by: [facility]  CSW signing off.  Cristobal Goldmann, [place] Clinical Social Worker [phone]

## 2015-06-24 NOTE — Care Management Important Message (Signed)
Important Message  Patient Details  Name: Phillip Kelley MRN: [id number] Date of Birth: Apr 21, 1927   Medicare Important Message Given:  Yes    [name] 06/24/2015, 11:33 AM

## 2015-06-24 NOTE — Discharge Summary (Signed)
Physician Discharge Summary  Phillip Kelley ZOX:[id number] DOB: 11-07-1926 DOA: 06/17/2015  PCP: [name], MD  Admit date: 06/17/2015 Discharge date: 06/24/2015  Time spent: 35 minutes  Recommendations for Outpatient Follow-up:  1. For his DVT please administer Apixaban 10 mg PO DID until 06/30/2015, then continue at 5 mg PO BID thereafter.  2. Please give Azithromycin 500 mg PO q daily x 3 days then stop 3. He was discharged on a prednisone taper for his upper extremity dermatitis.  4. Repeat BMP in 5-7 days, was found to be in acute on chronic renal failure during this hospitalization. On day of discharge had creatinine of 1.9.   Discharge Diagnoses:  Principal Problem:   Acute renal failure superimposed on stage 3 chronic kidney disease (HCC) Active Problems:   Gait abnormality   Constipation   Chronic systolic heart failure (HCC)   Pacemaker-BiV Medtronic   Atrial fibrillation (HCC)   Alcohol abuse   Normocytic anemia   ETOH abuse   Hypertension   Cellulitis   Itching   Rash and nonspecific skin eruption   Pressure ulcer   Discharge Condition: Stable  Diet recommendation: Heart Healthy  Filed Weights   06/21/15 0332 06/22/15 0539 06/23/15 0500  Weight: 66.452 kg (146 lb 8 oz) 64.365 kg (141 lb 14.4 oz) 64.638 kg (142 lb 8 oz)    History of present illness:  Phillip Kelley is a 81 y.o. male past medical history that includes CHF, A. fib, status post PPM, chronic kidney disease stage III, history on DVT formally on Xarelto, gait ataxia with frequent falls presents to the emergency department with chief complaint of bilateral upper extremity cellulitis with itching. Initial evaluation reveals acute on chronic kidney disease, bilateral upper extremity swelling/erythema concerning for cellulitis.  Patient reports some chronic dryness and rash to his upper extremities but states this is worsened over the last week. He was evaluated by his PCP and provided with  doxycycline with no improvement. Today he was advised to come to the emergency department for further evaluation. He denies any fever chills headache dizziness nausea vomiting. He denies any dysuria hematuria frequency or urgency. He does report worsening itching of his upper extremities but denies pain to the area. He does endorse worsening edema. He also complains of chronic constipation his last bowel movement was yesterday was normal color and consistency.  Workup in the emergency department Kl[facility] basic metabolic panel significant for creatinine of 2.57, CBC with hemoglobin of 9.8, platelets 124. He is afebrile hemodynamically stable and not hypoxic.  He is provided with 600 mg of clindamycin and 0.5 mg of Ativan intravenously.  Hospital Course:  Phillip Kelley is a 81 y.o. 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 His arms have developed this rash in the past but now they are quite swollen as well.  He was started on a course of doxycycline by his PCP. He took 2 days of this and called the office on 11/20 to tell his PCP that the arms were even more swollen. He was then recommended to come to the hospital. In the ER he was noted to have acute on chronic renal failure and he was therefore referred for admission. He has a history of chronic  kidney disease with baseline creatinine near 1.6-1.9. Lab work initially showing creatinine of 2.57.   With regard to bilateral upper extremity rash, patient receiving several courses of antimicrobial therapy which did not lead to significant improvement. Case was discussed with Dr. Yetta Barre of dermatology who felt this is likely related to dermatitis recommending 10-14 day prednisone  taper. He was also treated with triamcinolone cream. Patient showing gradual improvement however continue to have significant left upper extremity edema. He was further worked up with ultrasound which revealed presence of acute deep venous thrombosis involving the left brachial vein. Patient was started on Eliquis 10 mg by mouth twice a day for a planned 7 days followed by 5 mg by mouth twice a day thereafter.  From a renal standpoint, acute on chronic renal failure felt to be secondary to prerenal azotemia as Lasix therapy was held during this hospitalization. His creatinine came down to 1.98 by 06/24/2015, which was at his baseline. Having history of combined chronic systolic and diastolic congestive heart failure he was discharged on Lasix 20 mg by mouth daily. Please follow-up on his volume status.  He was discharged to skilled nursing facility for acute rehabilitation   Discharge Exam: Filed Vitals:   06/24/15 0544 06/24/15 0913  BP: 156/74 96/51  Pulse: 73   Temp: 98.6 F (37 C)   Resp: 19     General: Patient is awake and alert, no acute distress Cardiovascular: Irregular rate and rhythm normal S1-S2 to murmurs rubs or gallops Respiratory: Normal respiratory effort, lungs were clear to auscultation bilaterally Abdomen: Soft nontender nontender positive bowel sounds Skin exam: Erythema on bilateral upper extremities improving although he continues to have selective left upper extremity pitting edema  Discharge Instructions   Discharge Instructions    Call MD for:  difficulty breathing, headache or visual disturbances    Complete by:  As directed      Call MD for:  extreme fatigue    Complete by:  As directed      Call MD for:  hives    Complete by:  As directed      Call MD for:  persistant dizziness or light-headedness    Complete by:  As directed      Call MD for:  persistant nausea and vomiting    Complete by:  As directed      Call MD for:  redness, tenderness, or signs  of infection (pain, swelling, redness, odor or green/yellow discharge around incision site)    Complete by:  As directed      Call MD for:  severe uncontrolled pain    Complete by:  As directed      Call MD for:  temperature >100.4    Complete by:  As directed      Call MD for:    Complete by:  As directed      Diet - low sodium heart healthy    Complete by:  As directed      Increase activity slowly    Complete by:  As directed           Current Discharge Medication List    START taking these medications   Details  !! apixaban (ELIQUIS) 5 MG TABS tablet Take 2 tablets (10 mg total) by mouth 2 (two) times daily. Qty: 60 tablet, Refills: 0    !! apixaban (ELIQUIS) 5 MG TABS tablet Take 1 tablet (5 mg total) by mouth 2 (two) times daily. Qty: 60 tablet, Refills: 0  azithromycin (ZITHROMAX) 250 MG tablet Take 1 tablet (250 mg total) by mouth daily. Qty: 4 tablet, Refills: 0    docusate sodium (COLACE) 100 MG capsule Take 1 capsule (100 mg total) by mouth 2 (two) times daily. Qty: 10 capsule, Refills: 0    predniSONE (DELTASONE) 10 MG tablet Take 30 mg PO q daily for 3 days, followed by 20 mg PO q daily for 3 days, followed by 10 mg PO q daily for 3 days Qty: 40 tablet, Refills: 0    Triamcinolone Acetonide (TRIAMCINOLONE 0.1 % CREAM : EUCERIN) CREA Apply 1 application topically 2 (two) times daily. Qty: 1 each, Refills: 0     !! - Potential duplicate medications found. Please discuss with provider.    CONTINUE these medications which have NOT CHANGED   Details  amiodarone (PACERONE) 200 MG tablet Take 100 mg by mouth daily. Patient taking by mouth one half tablet (100 mg) daily Refills: 3    Calcium Carbonate-Vitamin D (CALTRATE 600+D PO) Take 1 tablet by mouth 2 (two) times daily.    furosemide (LASIX) 20 MG tablet TAKE 1 TABLET (20 MG TOTAL) BY MOUTH DAILY. Qty: 30 tablet, Refills: 0    hydrALAZINE (APRESOLINE) 25 MG tablet Take 25 mg by mouth 2 (two) times daily.     hydrOXYzine (ATARAX/VISTARIL) 10 MG tablet Take 10 mg by mouth 3 (three) times daily.    levothyroxine (SYNTHROID, LEVOTHROID) 50 MCG tablet Take 50 mcg by mouth daily.  Refills: 0    Multiple Vitamin (MULTIVITAMIN) tablet Take 1 tablet by mouth daily.    Omega-3 Fatty Acids (FISH OIL) 1000 MG CAPS Take 1,000 mg by mouth 2 (two) times daily.    Tamsulosin HCl (FLOMAX) 0.4 MG CAPS Take 0.4 mg by mouth 2 (two) times daily.       STOP taking these medications     doxycycline (ADOXA) 100 MG tablet      Ginkgo Biloba (GNP GINGKO BILOBA EXTRACT PO)      Melatonin 3 MG TABS      aspirin EC 81 MG tablet        Allergies  Allergen Reactions  . Oxycodone Other (See Comments)    Pt was VERY confused and didn't know where he was at  . Penicillins Rash  . Sulfa Antibiotics Rash  . Losartan Other (See Comments)    Hyperkalemia    Follow-up Information    Follow up with BADGER,MICHAEL C, MD In 1 week.   Specialty:  Family Medicine   Contact information:   [address] [place] [address] [phone]        The results of significant diagnostics from this hospitalization (including imaging, microbiology, ancillary and laboratory) are listed below for reference.    Significant Diagnostic Studies: Dg Chest Port 1 View  06/22/2015  CLINICAL DATA:  Hypoxia this morning.  No chest pain. EXAM: PORTABLE CHEST 1 VIEW COMPARISON:  06/21/2015 FINDINGS: Low lung volumes with bibasilar opacities, likely atelectasis. Small left pleural effusion is stable. Mild cardiomegaly. No overt edema currently. Left pacer remains in place, unchanged. IMPRESSION: Low lung volumes with bibasilar atelectasis. Stable small left effusion. Improving edema pattern with no current evidence for failure. Electronically Signed   By: Charlett Nose M.D.   On: 06/22/2015 10:04   Dg Chest Port 1 View  06/21/2015  CLINICAL DATA:  Dyspnea EXAM: PORTABLE CHEST 1 VIEW COMPARISON:  12/05/2014 chest radiograph.  FINDINGS: Stable configuration of 3 lead left subclavian pacemaker. Stable cardiomediastinal silhouette  with mild cardiomegaly. No pneumothorax. Possible small left pleural effusion. Low lung volumes. Mild pulmonary edema. Bibasilar lung opacities likely represent atelectasis. Severe degenerative changes in the bilateral glenohumeral joints. IMPRESSION: 1. Mild cardiomegaly and mild pulmonary edema, most in keeping with mild congestive heart failure. 2. Low lung volumes with bibasilar atelectasis. 3. Likely small left pleural effusion. Electronically Signed   By: Delbert Phenix M.D.   On: 06/21/2015 12:49    Microbiology: No results found for this or any previous visit (from the past 240 hour(s)).   Labs: Basic Metabolic Panel:  Recent Labs Lab 06/20/15 0828 06/21/15 0615 06/22/15 0610 06/23/15 0923 06/24/15 0618  NA 141 142 142 143 139  K 4.5 4.3 4.4 4.6 4.6  CL 108 111 109 109 104  CO2 24 25 27 27 30   GLUCOSE 121* 93 95 84 90  BUN 35* 35* 38* 37* 41*  CREATININE 2.02* 1.89* 1.93* 1.78* 1.98*  CALCIUM 8.6* 8.7* 8.6* 8.7* 8.3*   Liver Function Tests: No results for input(s): AST, ALT, ALKPHOS, BILITOT, PROT, ALBUMIN in the last 168 hours. No results for input(s): LIPASE, AMYLASE in the last 168 hours. No results for input(s): AMMONIA in the last 168 hours. CBC:  Recent Labs Lab 06/17/15 1315 06/18/15 0547 06/19/15 0835 06/22/15 1001 06/23/15 0923  WBC 10.1 8.1 10.4 12.2* 12.2*  NEUTROABS 6.8  --   --   --   --   HGB 9.8* 8.3* 8.9* 8.9* 8.7*  HCT 29.6* 25.3* 26.9* 27.5* 27.5*  MCV 95.8 94.8 94.1 94.5 95.2  PLT 124* 106* 109* 142* 131*   Cardiac Enzymes: No results for input(s): CKTOTAL, CKMB, CKMBINDEX, TROPONINI in the last 168 hours. BNP: BNP (last 3 results)  Recent Labs  12/05/14 1437 06/20/15 1915 06/22/15 1001  BNP 697.0* 2406.3* 1473.9*    ProBNP (last 3 results) No results for input(s): PROBNP in the last 8760 hours.  CBG: No results for input(s):  GLUCAP in the last 168 hours.     SignedJeralyn Bennett  [facility] 06/24/2015, 10:37 AM

## 2015-06-24 NOTE — Progress Notes (Signed)
Pt prepared for d/c to SNF. IV d/c'd. Skin intact except as charted in most recent assessments. Vitals are stable. Report called to receiving facility. Pt to be transported by ambulance service. 

## 2015-06-24 NOTE — NC FL2 (Signed)
MEDICAID FL2 LEVEL OF CARE SCREENING TOOL     IDENTIFICATION  Patient Name: Phillip Kelley Birthdate: [birth year] Sex: male Admission Date (Current Location): 06/17/2015  [place] and [place] Number: [facility] and Address:  [facility], [address] [place], [place] [address]      Provider Number: [id number]  Attending Physician Name and Address:  Jeralyn Bennett, MD  Relative Name and Phone Number:  Nicklas Mcsweeney, Wife, [phone]    Current Level of Care: Hospital Recommended Level of Care: Skilled Nursing Facility Prior Approval Number:    Date Approved/Denied:   PASRR Number: [id number]  Discharge Plan: SNF    Current Diagnoses: Patient Active Problem List   Diagnosis Date Noted  . Pressure ulcer 06/23/2015  . Rash and nonspecific skin eruption 06/18/2015  . Cellulitis 06/17/2015  . Itching 06/17/2015  . Acute renal failure superimposed on stage 3 chronic kidney disease (HCC) 06/17/2015  . PAF (paroxysmal atrial fibrillation) (HCC)   . ETOH abuse   . Chronic diastolic CHF (congestive heart failure) (HCC)   . Hypertension   . Protein-calorie malnutrition, severe (HCC) 07/09/2014  . Fall   . Bleeding 07/08/2014  . Alcohol abuse 07/08/2014  . CKD (chronic kidney disease) stage 3, GFR 30-59 ml/min 07/08/2014  . Normocytic anemia 07/08/2014  . Chronic diastolic heart failure (HCC)   . Atrial fibrillation (HCC) 05/07/2014  . Other primary cardiomyopathies 03/12/2012  . Pacemaker-BiV Medtronic 01/26/2012  . Chronic systolic heart failure (HCC) 12/21/2011  . Occlusion and stenosis of carotid artery without mention of cerebral infarction 10/21/2011  . Gait abnormality 03/31/2011  . Unspecified vitamin D deficiency 03/31/2011  . Constipation 03/31/2011  . Low back pain 03/31/2011  . Balance problems 03/31/2011    Orientation ACTIVITIES/SOCIAL BLADDER RESPIRATION    Self, Time, Situation, Place  Family  supportive Continent Normal  BEHAVIORAL SYMPTOMS/MOOD NEUROLOGICAL BOWEL NUTRITION STATUS      Continent  (See DC Summary)  PHYSICIAN VISITS COMMUNICATION OF NEEDS Height & Weight Skin    Verbally   150 lbs. Normal          AMBULATORY STATUS RESPIRATION    Assist extensive Normal      Personal Care Assistance Level of Assistance  Bathing, Feeding, Dressing Bathing Assistance: Maximum assistance Feeding assistance: Maximum assistance Dressing Assistance: Maximum assistance      Functional Limitations Info  Sight Sight Info: Impaired           SPECIAL CARE FACTORS FREQUENCY  PT (By licensed PT)     PT Frequency: 5x/week             Additional Factors Info  Code Status, Allergies Code Status Info: Full Allergies Info: Oxycodone, Penicillins, Sulfa Antibiotics, Losartan           Current Medications (06/24/2015):  This is the current hospital active medication list Current Facility-Administered Medications  Medication Dose Route Frequency Provider Last Rate Last Dose  . acetaminophen (TYLENOL) tablet 650 mg  650 mg Oral Q6H PRN Gwenyth Bender, NP       Or  . acetaminophen (TYLENOL) suppository 650 mg  650 mg Rectal Q6H PRN Gwenyth Bender, NP      . amiodarone (PACERONE) tablet 100 mg  100 mg Oral Daily Lesle Chris Black, NP   100 mg at 06/24/15 0913  . apixaban (ELIQUIS) tablet 10 mg  10 mg Oral BID Calvert Cantor, MD   10 mg at 06/24/15 0913  . [  START ON 06/30/2015] apixaban (ELIQUIS) tablet 5 mg  5 mg Oral BID Calvert Cantor, MD      . aspirin EC tablet 81 mg  81 mg Oral Daily Gwenyth Bender, NP   81 mg at 06/24/15 0913  . azithromycin (ZITHROMAX) tablet 250 mg  250 mg Oral Daily Jeralyn Bennett, MD   250 mg at 06/24/15 1610  . bisacodyl (DULCOLAX) suppository 10 mg  10 mg Rectal Daily PRN Gwenyth Bender, NP      . docusate sodium (COLACE) capsule 100 mg  100 mg Oral BID Gwenyth Bender, NP   100 mg at 06/24/15 0913  . folic acid (FOLVITE) tablet 1 mg  1 mg Oral Daily Gwenyth Bender, NP   1 mg at 06/24/15 0913  . guaiFENesin (MUCINEX) 12 hr tablet 600 mg  600 mg Oral BID Calvert Cantor, MD   600 mg at 06/24/15 0918  . hydrALAZINE (APRESOLINE) injection 10 mg  10 mg Intravenous Q6H PRN Rolan Lipa, NP   10 mg at 06/22/15 0559  . hydrALAZINE (APRESOLINE) tablet 25 mg  25 mg Oral BID Gwenyth Bender, NP   25 mg at 06/23/15 2221  . HYDROmorphone (DILAUDID) injection 0.5 mg  0.5 mg Intravenous Q2H PRN Lesle Chris Black, NP      . hydrOXYzine (ATARAX/VISTARIL) tablet 25 mg  25 mg Oral TID PRN Gwenyth Bender, NP      . levothyroxine (SYNTHROID, LEVOTHROID) tablet 50 mcg  50 mcg Oral QAC breakfast Gwenyth Bender, NP   50 mcg at 06/24/15 0913  . magnesium citrate solution 1 Bottle  1 Bottle Oral Once PRN Lesle Chris Black, NP      . menthol-cetylpyridinium (CEPACOL) lozenge 3 mg  1 lozenge Oral PRN Calvert Cantor, MD   3 mg at 06/21/15 1142  . multivitamin with minerals tablet 1 tablet  1 tablet Oral Daily Gwenyth Bender, NP   1 tablet at 06/24/15 0913  . ondansetron (ZOFRAN) tablet 4 mg  4 mg Oral Q6H PRN Gwenyth Bender, NP       Or  . ondansetron [facility]) injection 4 mg  4 mg Intravenous Q6H PRN Lesle Chris Black, NP      . oxymetazoline (AFRIN) 0.05 % nasal spray 1 spray  1 spray Each Nare BID Calvert Cantor, MD   1 spray at 06/24/15 0913  . polyethylene glycol (MIRALAX / GLYCOLAX) packet 17 g  17 g Oral Daily PRN Calvert Cantor, MD   17 g at 06/23/15 1827  . predniSONE (DELTASONE) tablet 30 mg  30 mg Oral Q breakfast Calvert Cantor, MD   30 mg at 06/24/15 0912  . tamsulosin (FLOMAX) capsule 0.4 mg  0.4 mg Oral BID Gwenyth Bender, NP   0.4 mg at 06/24/15 0913  . thiamine (VITAMIN B-1) tablet 100 mg  100 mg Oral Daily Lesle Chris Black, NP   100 mg at 06/24/15 0913  . triamcinolone 0.1 % cream : eucerin cream, 1:1   Topical BID Calvert Cantor, MD         Discharge Medications: Please see discharge summary for a list of discharge medications.  Relevant Imaging Results:  Relevant Lab  Results:  Recent Labs    Additional Information    Mearl Latin, LCSWA

## 2015-07-11 ENCOUNTER — Other Ambulatory Visit: Payer: Self-pay | Admitting: Internal Medicine

## 2015-07-14 ENCOUNTER — Encounter: Payer: Self-pay | Admitting: *Deleted

## 2015-07-25 ENCOUNTER — Emergency Department (HOSPITAL_COMMUNITY)
Admission: EM | Admit: 2015-07-25 | Discharge: 2015-07-25 | Disposition: A | Discharge status: Home / Self Care | Payer: Medicare Other | Attending: Emergency Medicine | Admitting: Emergency Medicine

## 2015-07-25 ENCOUNTER — Emergency Department (HOSPITAL_COMMUNITY): Payer: Medicare Other

## 2015-07-25 ENCOUNTER — Encounter (HOSPITAL_COMMUNITY): Payer: Self-pay | Admitting: Emergency Medicine

## 2015-07-25 DIAGNOSIS — M7981 Nontraumatic hematoma of soft tissue: Secondary | ICD-10-CM | POA: Diagnosis not present

## 2015-07-25 DIAGNOSIS — I482 Chronic atrial fibrillation: Secondary | ICD-10-CM | POA: Insufficient documentation

## 2015-07-25 DIAGNOSIS — R1032 Left lower quadrant pain: Secondary | ICD-10-CM | POA: Diagnosis not present

## 2015-07-25 DIAGNOSIS — Z79899 Other long term (current) drug therapy: Secondary | ICD-10-CM | POA: Insufficient documentation

## 2015-07-25 DIAGNOSIS — R197 Diarrhea, unspecified: Secondary | ICD-10-CM | POA: Diagnosis not present

## 2015-07-25 DIAGNOSIS — Z87891 Personal history of nicotine dependence: Secondary | ICD-10-CM | POA: Diagnosis not present

## 2015-07-25 DIAGNOSIS — R21 Rash and other nonspecific skin eruption: Secondary | ICD-10-CM | POA: Diagnosis not present

## 2015-07-25 DIAGNOSIS — D649 Anemia, unspecified: Secondary | ICD-10-CM | POA: Insufficient documentation

## 2015-07-25 DIAGNOSIS — I5032 Chronic diastolic (congestive) heart failure: Secondary | ICD-10-CM | POA: Diagnosis not present

## 2015-07-25 DIAGNOSIS — I10 Essential (primary) hypertension: Secondary | ICD-10-CM | POA: Insufficient documentation

## 2015-07-25 DIAGNOSIS — Z7902 Long term (current) use of antithrombotics/antiplatelets: Secondary | ICD-10-CM | POA: Insufficient documentation

## 2015-07-25 DIAGNOSIS — Z880 Allergy status to penicillin: Secondary | ICD-10-CM | POA: Diagnosis not present

## 2015-07-25 DIAGNOSIS — Z7952 Long term (current) use of systemic steroids: Secondary | ICD-10-CM | POA: Insufficient documentation

## 2015-07-25 DIAGNOSIS — Z86718 Personal history of other venous thrombosis and embolism: Secondary | ICD-10-CM | POA: Insufficient documentation

## 2015-07-25 DIAGNOSIS — G8929 Other chronic pain: Secondary | ICD-10-CM | POA: Insufficient documentation

## 2015-07-25 DIAGNOSIS — Z950 Presence of cardiac pacemaker: Secondary | ICD-10-CM | POA: Insufficient documentation

## 2015-07-25 LAB — I-STAT CHEM 8, ED
BUN: 40 mg/dL — ABNORMAL HIGH (ref 6–20)
CHLORIDE: 103 mmol/L (ref 101–111)
Calcium, Ion: 1.22 mmol/L (ref 1.13–1.30)
Creatinine, Ser: 2.7 mg/dL — ABNORMAL HIGH (ref 0.61–1.24)
Glucose, Bld: 92 mg/dL (ref 65–99)
HEMATOCRIT: 22 % — AB (ref 39.0–52.0)
Hemoglobin: 7.5 g/dL — ABNORMAL LOW (ref 13.0–17.0)
POTASSIUM: 4.8 mmol/L (ref 3.5–5.1)
SODIUM: 138 mmol/L (ref 135–145)
TCO2: 24 mmol/L (ref 0–100)

## 2015-07-25 LAB — CBC WITH DIFFERENTIAL/PLATELET
BASOS ABS: 0 10*3/uL (ref 0.0–0.1)
BASOS PCT: 0 %
EOS ABS: 0.5 10*3/uL (ref 0.0–0.7)
Eosinophils Relative: 5 %
HCT: 22.2 % — ABNORMAL LOW (ref 39.0–52.0)
HEMOGLOBIN: 7.3 g/dL — AB (ref 13.0–17.0)
LYMPHS ABS: 0.7 10*3/uL (ref 0.7–4.0)
Lymphocytes Relative: 7 %
MCH: 30.7 pg (ref 26.0–34.0)
MCHC: 32.9 g/dL (ref 30.0–36.0)
MCV: 93.3 fL (ref 78.0–100.0)
MONOS PCT: 19 %
Monocytes Absolute: 2 10*3/uL — ABNORMAL HIGH (ref 0.1–1.0)
Neutro Abs: 7.3 10*3/uL (ref 1.7–7.7)
Neutrophils Relative %: 69 %
Platelets: 172 10*3/uL (ref 150–400)
RBC: 2.38 MIL/uL — AB (ref 4.22–5.81)
RDW: 16 % — ABNORMAL HIGH (ref 11.5–15.5)
WBC: 10.5 10*3/uL (ref 4.0–10.5)

## 2015-07-25 MED ORDER — PREDNISONE 20 MG PO TABS
30.0000 mg | ORAL_TABLET | ORAL | Status: AC
  Administered 2015-07-25: 30 mg via ORAL
  Filled 2015-07-25: qty 2

## 2015-07-25 MED ORDER — PREDNISONE 10 MG PO TABS: ORAL_TABLET | ORAL | Status: AC

## 2015-07-25 MED ORDER — POLYETHYLENE GLYCOL 3350 17 G PO PACK: 17.0000 g | PACK | Freq: Every day | ORAL | Status: AC

## 2015-07-25 NOTE — ED Notes (Signed)
Brought in by EMS from home with c/o abdominal pain and skin rashes.  Pt reports LUQ abdominal pain, no N/V/D.  Pt also reports worsening rashes to left side that goes around his back.  Hx of shingles.

## 2015-07-25 NOTE — ED Notes (Signed)
Notified NP,Gail pt. i-stat Chem 8 results Hemoglobin 7.5 and RN,Allan made aware.

## 2015-07-25 NOTE — ED Notes (Signed)
Bed: YB01 Expected date:  Expected time:  Means of arrival:  Comments: EMS 82yo M abd pain / rash / ? shingles

## 2015-07-25 NOTE — ED Provider Notes (Signed)
CSN: [id number]     Arrival date & time 07/25/15  0402 History   First MD Initiated Contact with Patient 07/25/15 0405     Chief Complaint  Patient presents with  . Abdominal Pain  . Rash     (Consider location/radiation/quality/duration/timing/severity/associated sxs/prior Treatment) HPI Comments: This is an 82 year old male with sick.  Extensive medical history including hypertension, CHF, bradycardia, arthritis, chronic low back pain, DVT.  Nonischemic cardiomyopathy, alcohol abuse, paroxysmal atrial fibrillation and chronic recurrent dermatitis of the trunk, who presents to the emergency department with the complaint of 2 days of left lower quadrant abdominal pain, loose stools and worsening skin rash over his back and left side.  He denies nausea, vomiting, constipation, dysuria, fever or chills.  States he has not been on steroid taper recently, which is usually what he is prescribed for his skin rash  Patient is a 82 y.o. male presenting with abdominal pain and rash. The history is provided by the patient.  Abdominal Pain Pain location:  LLQ and LUQ Pain quality: aching   Pain radiates to:  Does not radiate Pain severity:  Mild Onset quality:  Gradual Duration:  2 days Timing:  Unable to specify Chronicity:  New Context comment:  Loose stools Relieved by:  Nothing Worsened by:  Nothing tried Ineffective treatments:  None tried Associated symptoms: diarrhea   Associated symptoms: no chills, no constipation, no cough, no fever and no nausea   Rash Associated symptoms: abdominal pain and diarrhea   Associated symptoms: no fever and no nausea     Past Medical History  Diagnosis Date  . Cataract   . Hypertension   . Chronic diastolic CHF (congestive heart failure) (HCC)     a. 09/2014 Echo: EF 65-70%, sev LVH, Gr 1 DD, mod dil LA.  Marland Kitchen Symptomatic bradycardia     a. 01/2012 s/p Medtronic BiV PPM  . Anemia   . History of blood transfusion 2001    following hip surgery.  .  Arthritis   . Chronic lower back pain   . Leg edema   . History of DVT (deep vein thrombosis)   . NICM (nonischemic cardiomyopathy) (HCC)     a. 09/2014 Echo: EF 65-70%.  . Gait instability   . ETOH abuse   . PAF (paroxysmal atrial fibrillation) (HCC)     a. No OAC 2/2 unsteady gait/falls.   Past Surgical History  Procedure Laterality Date  . Anterior fusion cervical spine  2001    for numbness in right arm, left with anhydrosis  . Total hip arthroplasty  2001    right; fell  . Carotid endarterectomy  ~ 2008    left  . Carpal tunnel release      bilaterally  . Insert / replace / remove pacemaker  01/25/12    Medtronic BiV PPM  . Tonsillectomy and adenoidectomy  ~ 1933  . Joint replacement    . Cataract extraction w/ intraocular lens implant  ~2011    left  . Ganglion cyst excision      ? left  . Bi-ventricular pacemaker insertion N/A 01/25/2012    Procedure: BI-VENTRICULAR PACEMAKER INSERTION (CRT-P);  Surgeon: Marinus Maw, MD;  Location: [facility] CATH LAB;  Service: Cardiovascular;  Laterality: N/A;   Family History  Problem Relation Age of Onset  . Congestive Heart Failure Mother    Social History  Substance Use Topics  . Smoking status: Former Smoker -- 1.00 packs/day for 35 years    Types: Cigarettes  Quit date: 08/21/1977  . Smokeless tobacco: Never Used  . Alcohol Use: 12.6 oz/week    14 Glasses of wine, 7 Shots of liquor per week     Comment: As much as possible    Review of Systems  Constitutional: Negative for fever and chills.  Respiratory: Negative for cough.   Gastrointestinal: Positive for abdominal pain and diarrhea. Negative for nausea and constipation.  Genitourinary: Negative for flank pain.  Musculoskeletal: Negative for back pain.  Skin: Positive for rash.  All other systems reviewed and are negative.     Allergies  Oxycodone; Penicillins; Sulfa antibiotics; and Losartan  Home Medications   Prior to Admission medications   Medication Sig  Start Date End Date Taking? Authorizing Provider  amiodarone (PACERONE) 200 MG tablet Take 100 mg by mouth daily. Patient taking by mouth one half tablet (100 mg) daily 08/19/14  Yes Historical Provider, MD  apixaban (ELIQUIS) 5 MG TABS tablet Take 1 tablet (5 mg total) by mouth 2 (two) times daily. 06/30/15  Yes Jeralyn Bennett, MD  Calcium Carbonate-Vitamin D (CALTRATE 600+D PO) Take 1 tablet by mouth 2 (two) times daily.   Yes Historical Provider, MD  docusate sodium (COLACE) 100 MG capsule Take 1 capsule (100 mg total) by mouth 2 (two) times daily. 06/24/15  Yes Jeralyn Bennett, MD  furosemide (LASIX) 20 MG tablet TAKE 1 TABLET (20 MG TOTAL) BY MOUTH DAILY. 07/14/15  Yes Marinus Maw, MD  hydrALAZINE (APRESOLINE) 25 MG tablet Take 25 mg by mouth 2 (two) times daily.   Yes Historical Provider, MD  levothyroxine (SYNTHROID, LEVOTHROID) 50 MCG tablet Take 50 mcg by mouth daily.  11/19/14  Yes Historical Provider, MD  Multiple Vitamin (MULTIVITAMIN) tablet Take 1 tablet by mouth daily.   Yes Historical Provider, MD  Omega-3 Fatty Acids (FISH OIL) 1000 MG CAPS Take 1,000 mg by mouth 2 (two) times daily.   Yes Historical Provider, MD  PROAIR HFA 108 (90 Base) MCG/ACT inhaler Inhale 2 puffs into the lungs every 6 (six) hours as needed. For shortness of breath 07/10/15  Yes Historical Provider, MD  Tamsulosin HCl (FLOMAX) 0.4 MG CAPS Take 0.4 mg by mouth 2 (two) times daily.    Yes Historical Provider, MD  Triamcinolone Acetonide (TRIAMCINOLONE 0.1 % CREAM : EUCERIN) CREA Apply 1 application topically 2 (two) times daily. 06/24/15  Yes Jeralyn Bennett, MD  apixaban (ELIQUIS) 5 MG TABS tablet Take 2 tablets (10 mg total) by mouth 2 (two) times daily. Patient not taking: Reported on 07/25/2015 06/24/15   Jeralyn Bennett, MD  azithromycin (ZITHROMAX) 250 MG tablet Take 1 tablet (250 mg total) by mouth daily. Patient not taking: Reported on 07/25/2015 06/24/15   Jeralyn Bennett, MD  predniSONE (DELTASONE) 10 MG  tablet Take 30 mg PO q daily for 3 days, followed by 20 mg PO q daily for 3 days, followed by 10 mg PO q daily for 3 days 07/25/15   Earley Favor, NP   BP 125/60 mmHg  Pulse 70  Temp(Src) 97.6 F (36.4 C) (Oral)  Resp 18  SpO2 96% Physical Exam  Constitutional: He is oriented to person, place, and time. He appears well-developed and well-nourished.  HENT:  Head: Normocephalic.  Eyes: Pupils are equal, round, and reactive to light.  Neck: Normal range of motion.  Cardiovascular: Normal rate and regular rhythm.   Pulmonary/Chest: Effort normal and breath sounds normal.  Abdominal: Soft.  Genitourinary: Guaiac negative stool.  Guaiac negative.  On examination, patient does have  excoriation in his perianal area.  No external or internal hemorrhoids.  Small amount of soft stool within the rectal vault  Musculoskeletal: Normal range of motion.  Neurological: He is alert and oriented to person, place, and time.  Skin: Skin is warm. Rash noted. No ecchymosis, no petechiae and no purpura noted. Rash is macular. Rash is not maculopapular, not nodular, not pustular, not vesicular and not urticarial. There is erythema.     Upper extremities with extensive bruising   Nursing note and vitals reviewed.   ED Course  Procedures (including critical care time) Labs Review Labs Reviewed  CBC WITH DIFFERENTIAL/PLATELET - Abnormal; Notable for the following:    RBC 2.38 (*)    Hemoglobin 7.3 (*)    HCT 22.2 (*)    RDW 16.0 (*)    Monocytes Absolute 2.0 (*)    All other components within normal limits  I-STAT CHEM 8, ED - Abnormal; Notable for the following:    BUN 40 (*)    Creatinine, Ser 2.70 (*)    Hemoglobin 7.5 (*)    HCT 22.0 (*)    All other components within normal limits    Imaging Review Dg Abd Acute W/chest  07/25/2015  CLINICAL DATA:  Acute onset of left upper quadrant abdominal pain and skin rash. Initial encounter. EXAM: DG ABDOMEN ACUTE W/ 1V CHEST COMPARISON:  Chest radiograph  performed 06/22/2015, and CT of the abdomen and pelvis performed 07/09/2014 FINDINGS: The lungs are well-aerated. Mild left-sided atelectasis is noted. There is no evidence of pleural effusion or pneumothorax. The cardiomediastinal silhouette is mildly enlarged. A pacemaker is noted overlying the left chest wall, with leads ending overlying the right atrium and right ventricle. The visualized bowel gas pattern is unremarkable. Scattered stool and air are seen within the colon; there is no evidence of small bowel dilatation to suggest obstruction. No free intra-abdominal air is identified on the provided decubitus view. No acute osseous abnormalities are seen; the sacroiliac joints are unremarkable in appearance. Diffuse degenerative change is noted along the lumbar spine. The right hip hemiarthroplasty is grossly unremarkable, though incompletely imaged. There is chronic medial subluxation of the right humeral head, with significant associated degenerative change. IMPRESSION: 1. Unremarkable bowel gas pattern; no free intra-abdominal air seen. Moderate amount of stool noted in the colon. 2. Mild left-sided atelectasis noted. Lungs otherwise clear. Mild cardiomegaly. Electronically Signed   By: Roanna Raider M.D.   On: 07/25/2015 04:54   I have personally reviewed and evaluated these images and lab results as part of my medical decision-making.   EKG Interpretation None     patient is anemic with a level of about 1 g does not have any rectal bleeding.  Guaiac negative.  Does not have any stool in the rectal vault.  His abdominal pain has totally resolved.  He will be started on steroid taper for his chronic recurrent rash.  Follow-up with his primary care physician.  MDM   Final diagnoses:  Rash and nonspecific skin eruption  Left lower quadrant pain  Anemia, unspecified anemia type         Earley Favor, NP 07/25/15 7897  Earley Favor, NP 07/25/15 8478  Gilda Crease, MD 07/25/15  [phone]

## 2015-08-01 IMAGING — DX DG CHEST 2V
2 series · 2 of 2 positions shown · non-contrast
Comparison: 01/26/2012

CLINICAL DATA: Weakness, fatigue, hypertension.  Loss of appetite.

EXAM:
CHEST  2 VIEW

[chest lat]
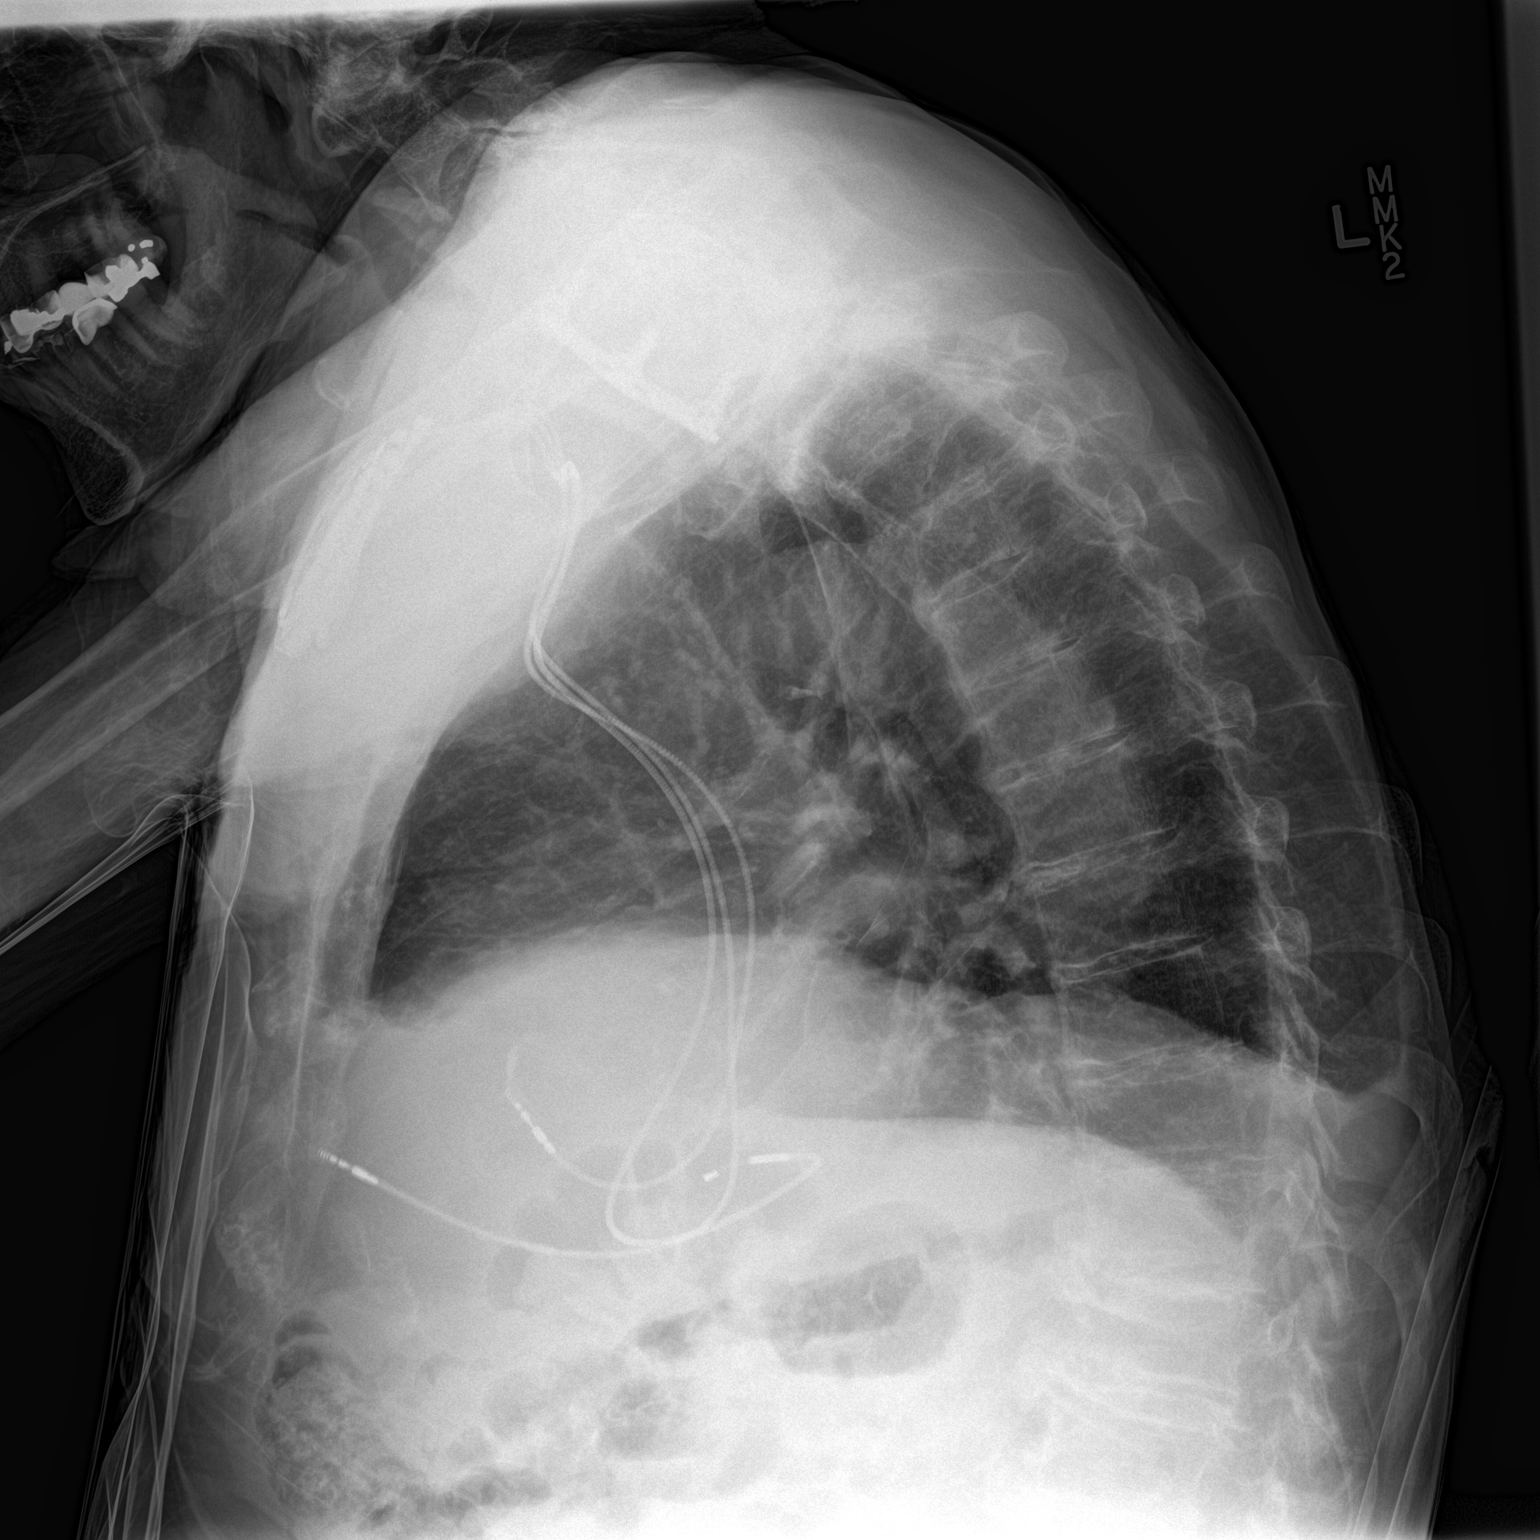

[chest ap]
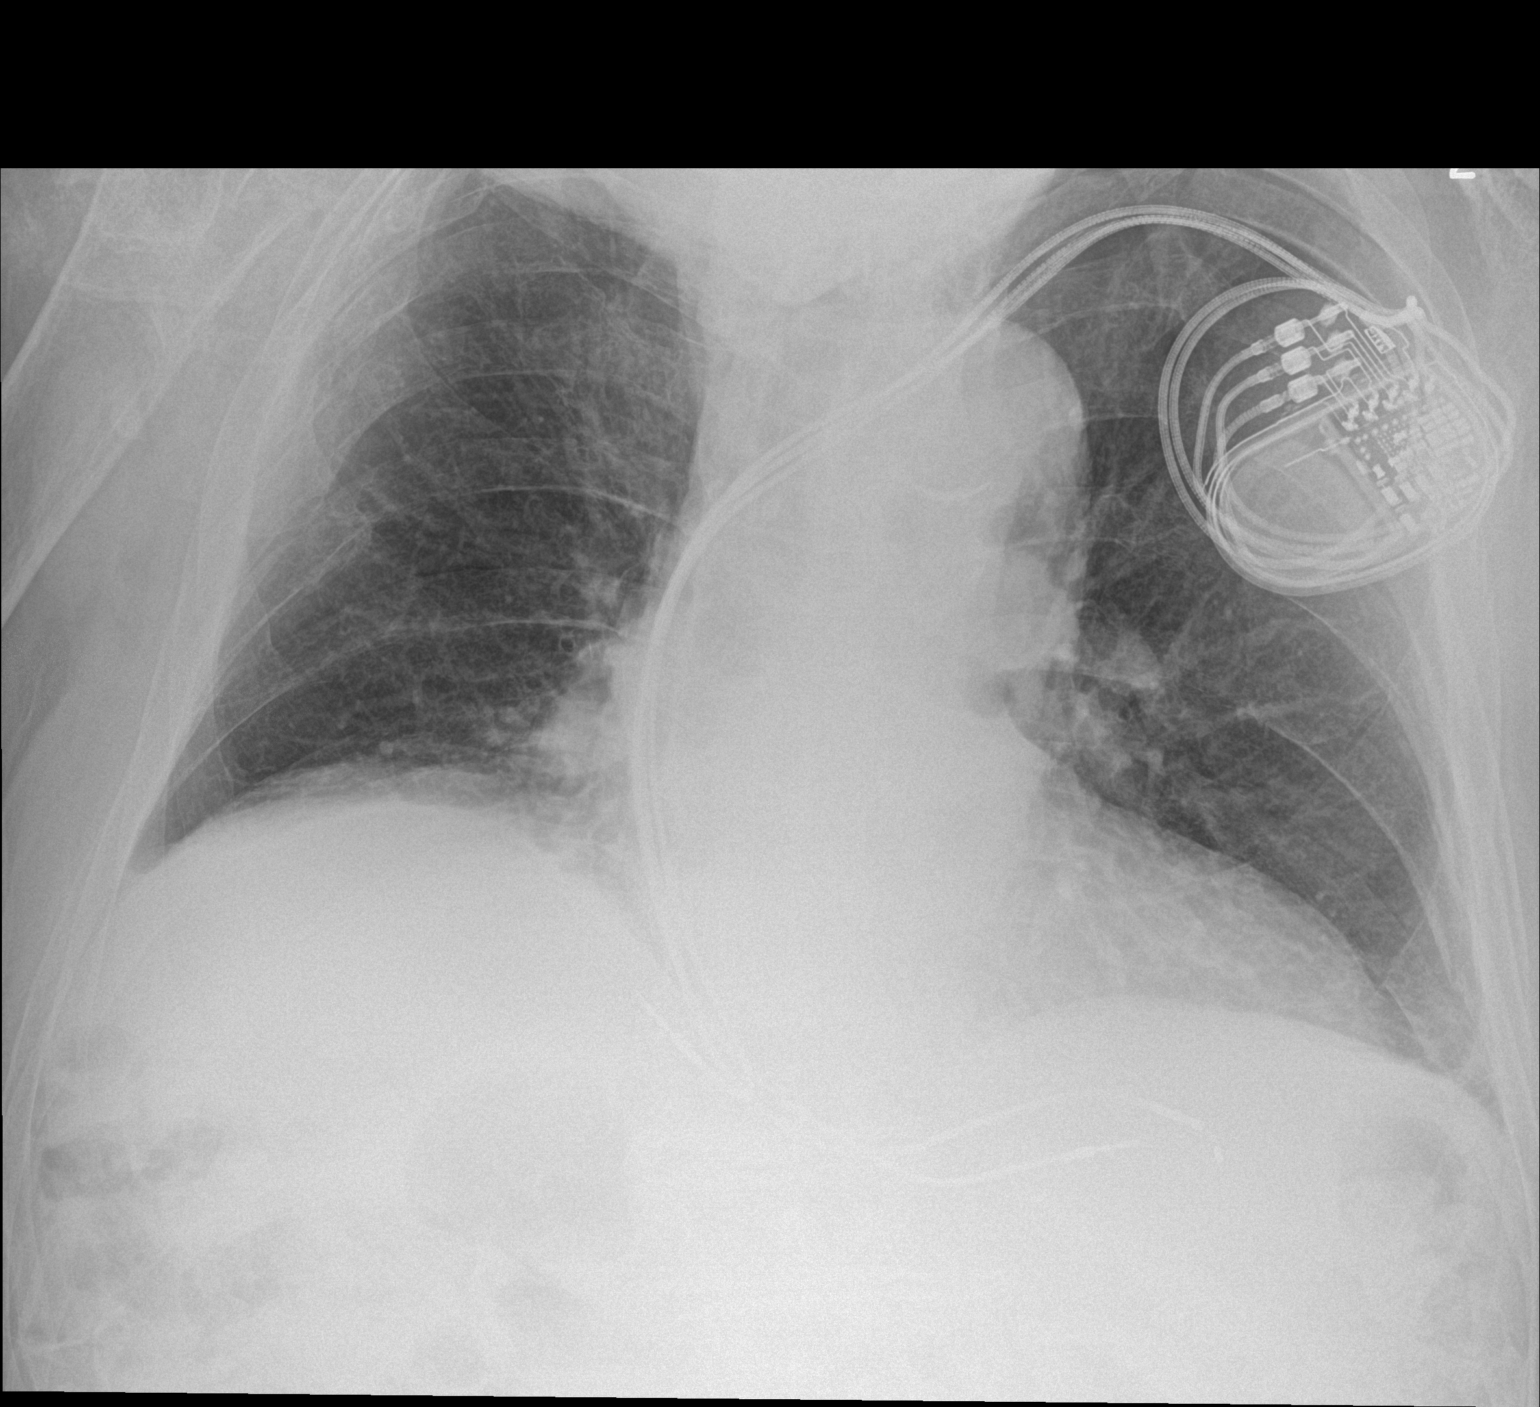

[2 of 2 positions shown; findings below may reference images not displayed]

FINDINGS: Left pacer remains in place, unchanged. Mild cardiomegaly. Low lung
volumes. Mild elevation of the right hemidiaphragm. Bibasilar
atelectasis. No effusions. Degenerative changes in the thoracic
spine. No acute bony abnormality.
IMPRESSION: Low lung volumes, bibasilar atelectasis.  Mild cardiomegaly.

## 2015-08-01 IMAGING — CT CT HEAD W/O CM
2 series · 16 of 30 positions shown, 20 images · non-contrast
Comparison: 07/08/2014

CLINICAL DATA: Hypertension, weakness, fatigue.

EXAM:
CT HEAD WITHOUT CONTRAST
TECHNIQUE: Contiguous axial images were obtained from the base of the skull
through the vertex without intravenous contrast.

[Series 201: head w/o, idose (1) · axial · non-contrast · 0.49mm/px · z∈[+105,+235]mm · 13 of 32 slices shown, 17 images]
[im 3/32  brain]
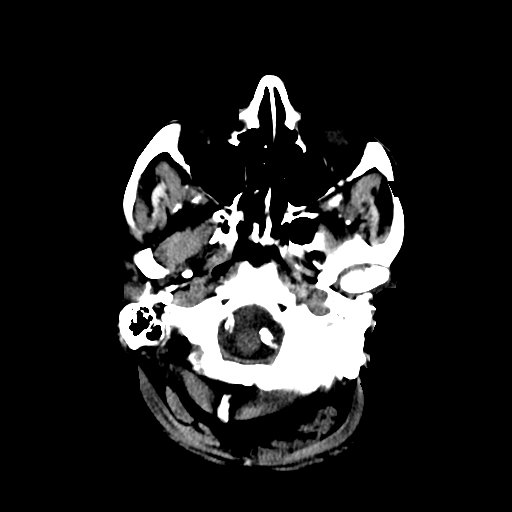
[im 3/32  bone]
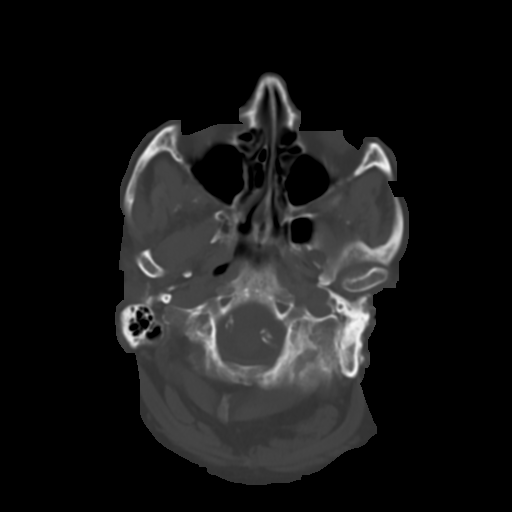
[im 5/32  brain]
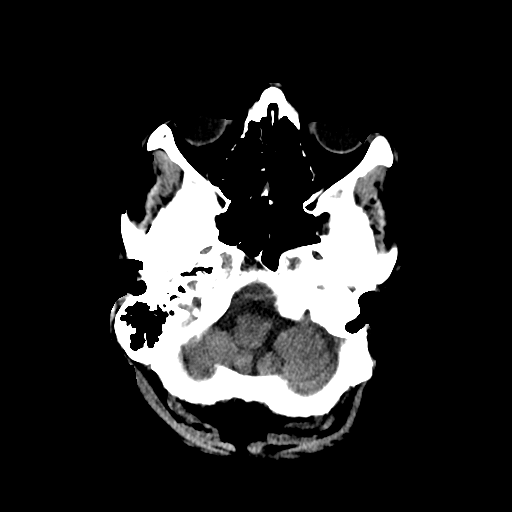
[im 7/32  brain]
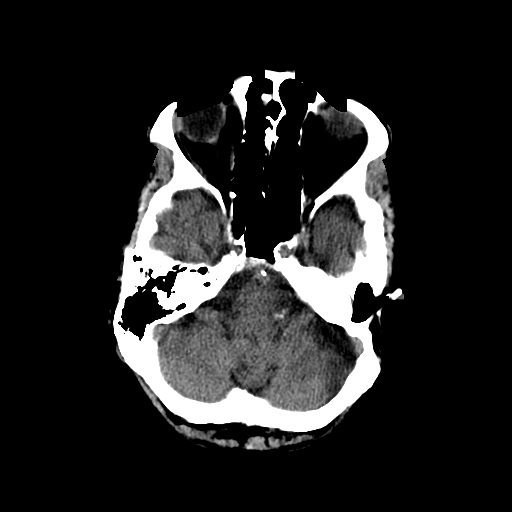
[im 9/32  brain]
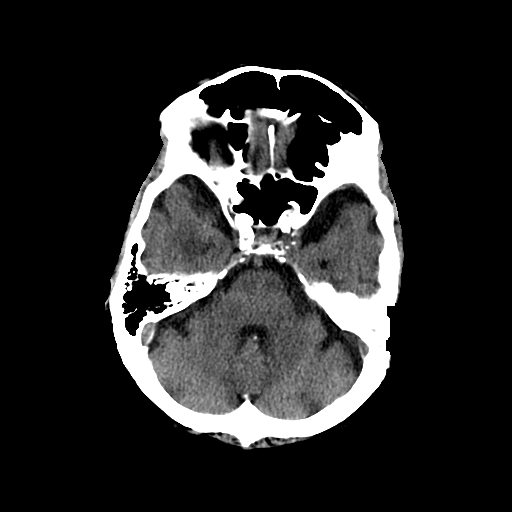
[im 12/32  brain]
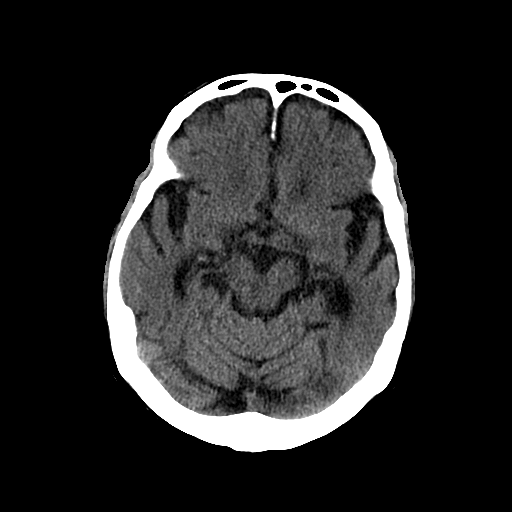
[im 12/32  bone]
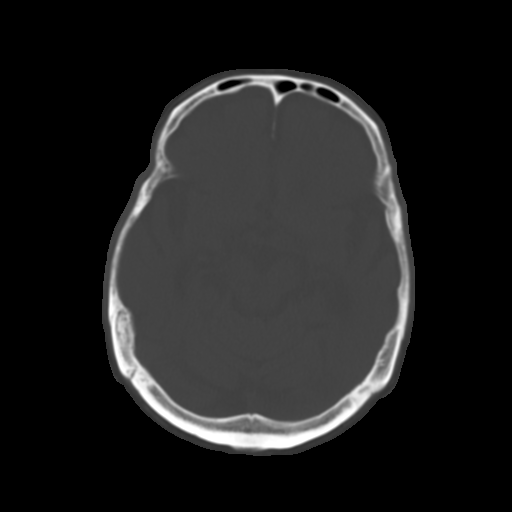
[im 14/32  brain]
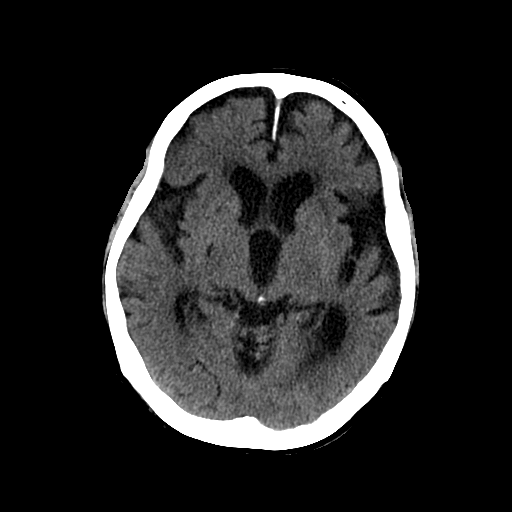
[im 16/32  brain]
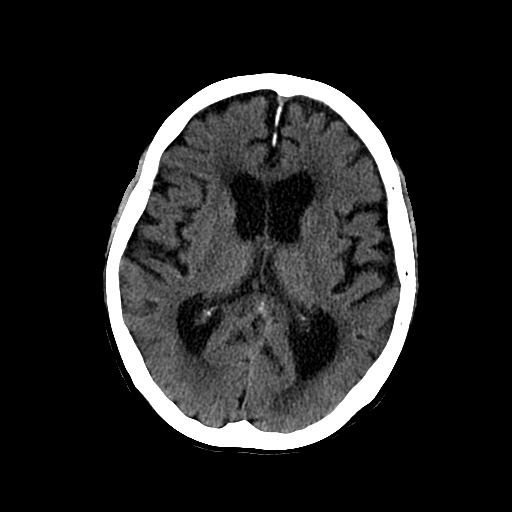
[im 18/32  brain]
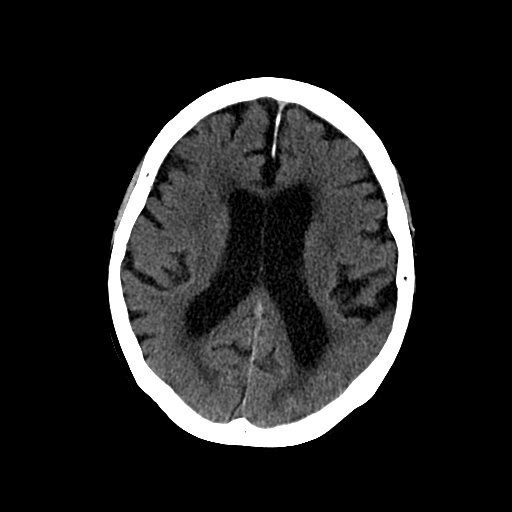
[im 20/32  brain]
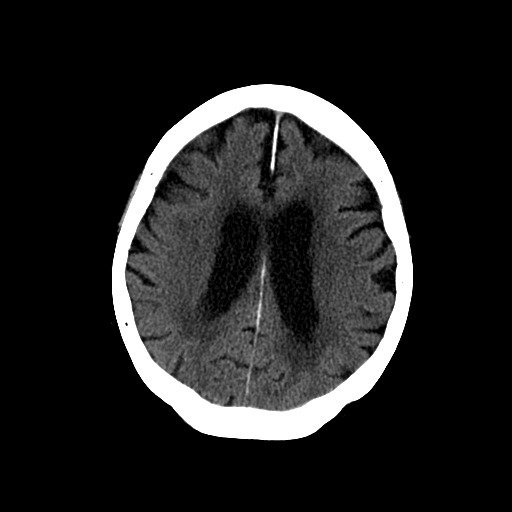
[im 20/32  bone]
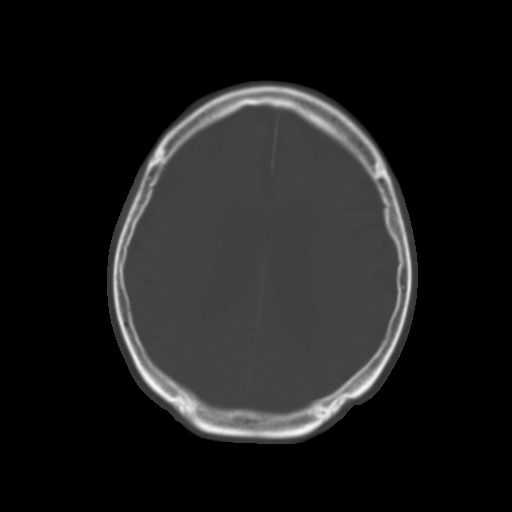
[im 23/32  brain]
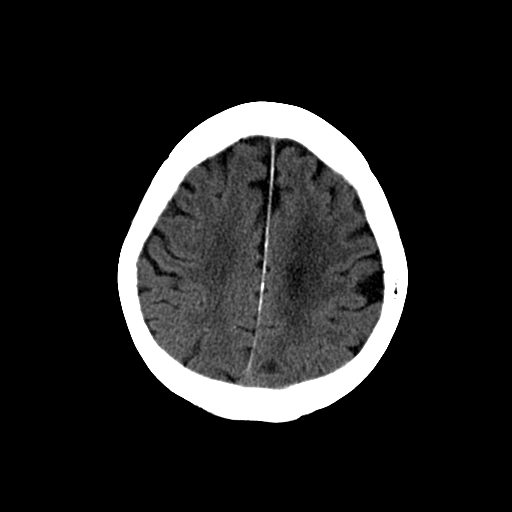
[im 25/32  brain]
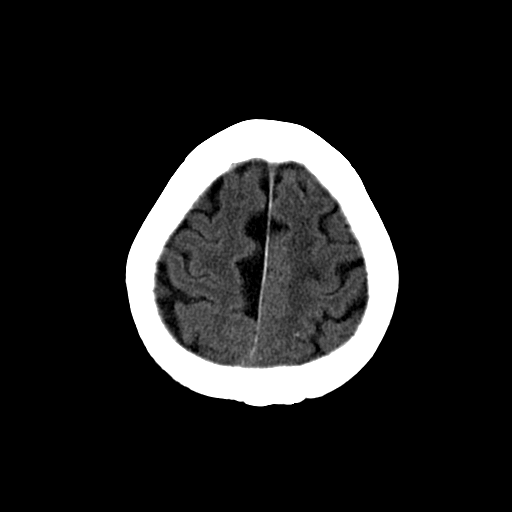
[im 27/32  brain]
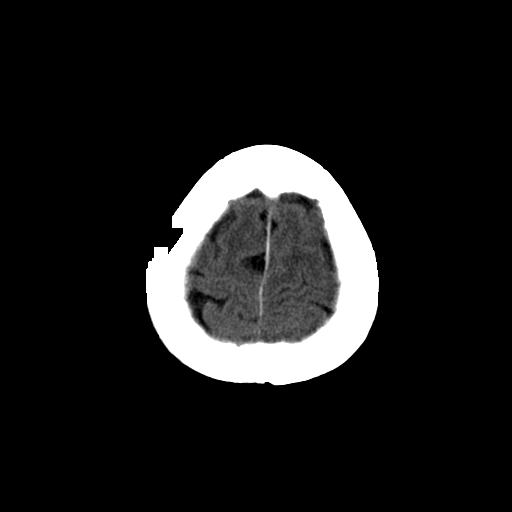
[im 29/32  brain]
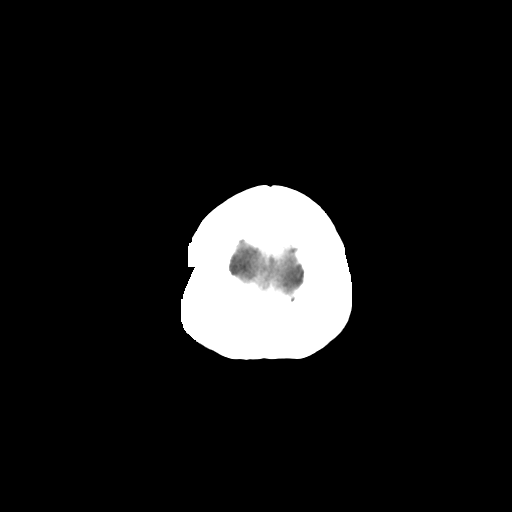
[im 29/32  bone]
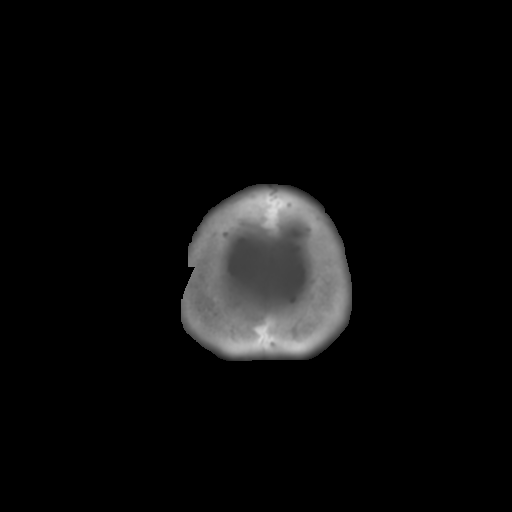

[Series 202: head w/o bone, idose (1) · axial · non-contrast · 0.49mm/px · z∈[+105,+150]mm · 3 of 32 slices shown]
[im 3/32  bone]
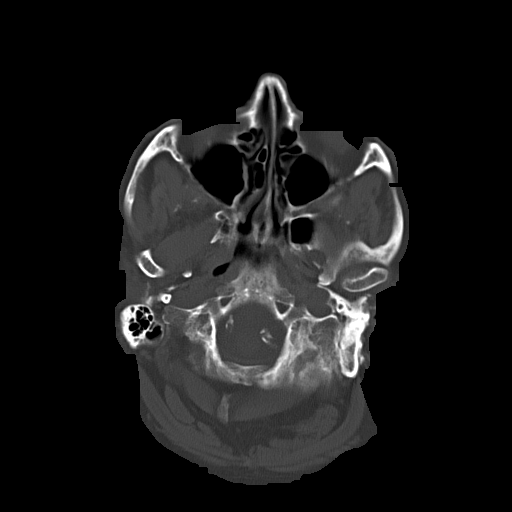
[im 7/32  bone]
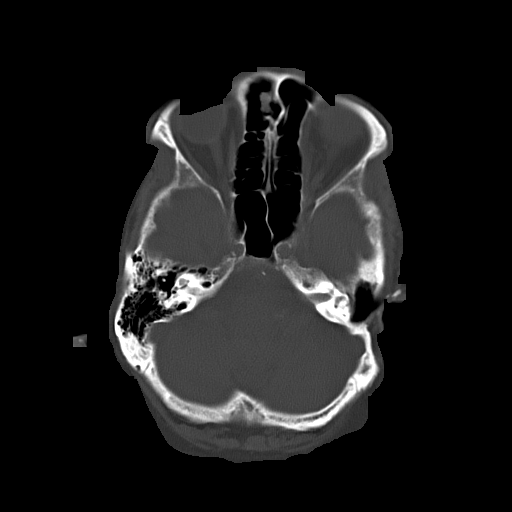
[im 12/32  bone]
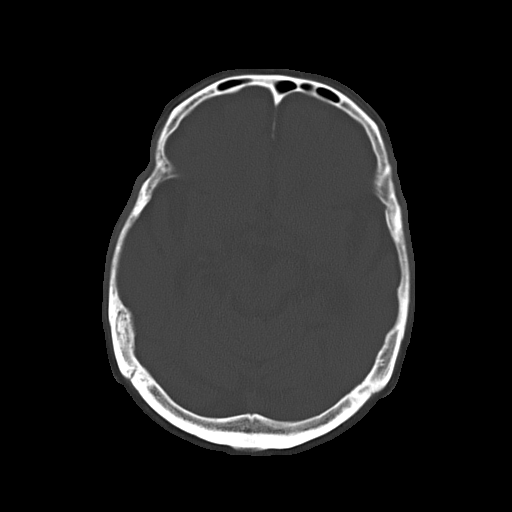

[16 of 30 positions shown; findings below may reference images not displayed]

FINDINGS: There is atrophy and chronic small vessel disease changes. Old right
basal ganglia lacunar infarcts. No acute intracranial abnormality.
Specifically, no hemorrhage, hydrocephalus, mass lesion, acute
infarction, or significant intracranial injury. No acute calvarial
abnormality. Prior left mastoidectomy. Paranasal sinuses and right
mastoid air cells clear.
IMPRESSION: Chronic changes as above.  No acute intracranial abnormality.

## 2015-08-19 ENCOUNTER — Encounter: Payer: Self-pay | Admitting: *Deleted

## 2015-08-31 ENCOUNTER — Encounter (HOSPITAL_BASED_OUTPATIENT_CLINIC_OR_DEPARTMENT_OTHER): Payer: Self-pay | Admitting: *Deleted

## 2015-08-31 ENCOUNTER — Emergency Department (HOSPITAL_BASED_OUTPATIENT_CLINIC_OR_DEPARTMENT_OTHER)
Admission: EM | Admit: 2015-08-31 | Discharge: 2015-08-31 | Disposition: A | Discharge status: Home / Self Care | Payer: Medicare Other | Attending: Emergency Medicine | Admitting: Emergency Medicine

## 2015-08-31 DIAGNOSIS — L539 Erythematous condition, unspecified: Secondary | ICD-10-CM | POA: Diagnosis not present

## 2015-08-31 DIAGNOSIS — Y998 Other external cause status: Secondary | ICD-10-CM | POA: Diagnosis not present

## 2015-08-31 DIAGNOSIS — Z86718 Personal history of other venous thrombosis and embolism: Secondary | ICD-10-CM | POA: Diagnosis not present

## 2015-08-31 DIAGNOSIS — W1839XA Other fall on same level, initial encounter: Secondary | ICD-10-CM | POA: Diagnosis not present

## 2015-08-31 DIAGNOSIS — Z7952 Long term (current) use of systemic steroids: Secondary | ICD-10-CM | POA: Insufficient documentation

## 2015-08-31 DIAGNOSIS — Z87891 Personal history of nicotine dependence: Secondary | ICD-10-CM | POA: Insufficient documentation

## 2015-08-31 DIAGNOSIS — Z79899 Other long term (current) drug therapy: Secondary | ICD-10-CM | POA: Insufficient documentation

## 2015-08-31 DIAGNOSIS — Z880 Allergy status to penicillin: Secondary | ICD-10-CM | POA: Diagnosis not present

## 2015-08-31 DIAGNOSIS — S81811A Laceration without foreign body, right lower leg, initial encounter: Secondary | ICD-10-CM | POA: Insufficient documentation

## 2015-08-31 DIAGNOSIS — Y9389 Activity, other specified: Secondary | ICD-10-CM | POA: Insufficient documentation

## 2015-08-31 DIAGNOSIS — I5032 Chronic diastolic (congestive) heart failure: Secondary | ICD-10-CM | POA: Diagnosis not present

## 2015-08-31 DIAGNOSIS — I10 Essential (primary) hypertension: Secondary | ICD-10-CM | POA: Diagnosis not present

## 2015-08-31 DIAGNOSIS — G8929 Other chronic pain: Secondary | ICD-10-CM | POA: Insufficient documentation

## 2015-08-31 DIAGNOSIS — Y9289 Other specified places as the place of occurrence of the external cause: Secondary | ICD-10-CM | POA: Insufficient documentation

## 2015-08-31 DIAGNOSIS — Z7901 Long term (current) use of anticoagulants: Secondary | ICD-10-CM | POA: Diagnosis not present

## 2015-08-31 DIAGNOSIS — Z862 Personal history of diseases of the blood and blood-forming organs and certain disorders involving the immune mechanism: Secondary | ICD-10-CM | POA: Diagnosis not present

## 2015-08-31 DIAGNOSIS — M1990 Unspecified osteoarthritis, unspecified site: Secondary | ICD-10-CM | POA: Diagnosis not present

## 2015-08-31 MED ORDER — CEPHALEXIN 500 MG PO CAPS: 500.0000 mg | ORAL_CAPSULE | Freq: Three times a day (TID) | ORAL | Status: AC

## 2015-08-31 NOTE — ED Notes (Signed)
He fell last night. Laceration to his right lower leg. Skin tear, swelling and bruising. Bleeding controlled.

## 2015-08-31 NOTE — Discharge Instructions (Signed)
Local wound care with bacitracin and dressing changes once daily.  Keflex as prescribed.  Return to the emergency department if you develop increasing pain, redness, pus draining from the wound, fever, or other new and concerning symptoms.   Laceration Care, Adult A laceration is a cut that goes through all of the layers of the skin and into the tissue that is right under the skin. Some lacerations heal on their own. Others need to be closed with stitches (sutures), staples, skin adhesive strips, or skin glue. Proper laceration care minimizes the risk of infection and helps the laceration to heal better. HOW TO CARE FOR YOUR LACERATION If sutures or staples were used:  Keep the wound clean and dry.  If you were given a bandage (dressing), you should change it at least one time per day or as told by your health care provider. You should also change it if it becomes wet or dirty.  Keep the wound completely dry for the first 24 hours or as told by your health care provider. After that time, you may shower or bathe. However, make sure that the wound is not soaked in water until after the sutures or staples have been removed.  Clean the wound one time each day or as told by your health care provider:  Wash the wound with soap and water.  Rinse the wound with water to remove all soap.  Pat the wound dry with a clean towel. Do not rub the wound.  After cleaning the wound, apply a thin layer of antibiotic ointmentas told by your health care provider. This will help to prevent infection and keep the dressing from sticking to the wound.  Have the sutures or staples removed as told by your health care provider. If skin adhesive strips were used:  Keep the wound clean and dry.  If you were given a bandage (dressing), you should change it at least one time per day or as told by your health care provider. You should also change it if it becomes dirty or wet.  Do not get the skin adhesive strips  wet. You may shower or bathe, but be careful to keep the wound dry.  If the wound gets wet, pat it dry with a clean towel. Do not rub the wound.  Skin adhesive strips fall off on their own. You may trim the strips as the wound heals. Do not remove skin adhesive strips that are still stuck to the wound. They will fall off in time. If skin glue was used:  Try to keep the wound dry, but you may briefly wet it in the shower or bath. Do not soak the wound in water, such as by swimming.  After you have showered or bathed, gently pat the wound dry with a clean towel. Do not rub the wound.  Do not do any activities that will make you sweat heavily until the skin glue has fallen off on its own.  Do not apply liquid, cream, or ointment medicine to the wound while the skin glue is in place. Using those may loosen the film before the wound has healed.  If you were given a bandage (dressing), you should change it at least one time per day or as told by your health care provider. You should also change it if it becomes dirty or wet.  If a dressing is placed over the wound, be careful not to apply tape directly over the skin glue. Doing that may cause the glue  to be pulled off before the wound has healed.  Do not pick at the glue. The skin glue usually remains in place for 5-10 days, then it falls off of the skin. General Instructions  Take over-the-counter and prescription medicines only as told by your health care provider.  If you were prescribed an antibiotic medicine or ointment, take or apply it as told by your doctor. Do not stop using it even if your condition improves.  To help prevent scarring, make sure to cover your wound with sunscreen whenever you are outside after stitches are removed, after adhesive strips are removed, or when glue remains in place and the wound is healed. Make sure to wear a sunscreen of at least 30 SPF.  Do not scratch or pick at the wound.  Keep all follow-up visits  as told by your health care provider. This is important.  Check your wound every day for signs of infection. Watch for:  Redness, swelling, or pain.  Fluid, blood, or pus.  Raise (elevate) the injured area above the level of your heart while you are sitting or lying down, if possible. SEEK MEDICAL CARE IF:  You received a tetanus shot and you have swelling, severe pain, redness, or bleeding at the injection site.  You have a fever.  A wound that was closed breaks open.  You notice a bad smell coming from your wound or your dressing.  You notice something coming out of the wound, such as wood or glass.  Your pain is not controlled with medicine.  You have increased redness, swelling, or pain at the site of your wound.  You have fluid, blood, or pus coming from your wound.  You notice a change in the color of your skin near your wound.  You need to change the dressing frequently due to fluid, blood, or pus draining from the wound.  You develop a new rash.  You develop numbness around the wound. SEEK IMMEDIATE MEDICAL CARE IF:  You develop severe swelling around the wound.  Your pain suddenly increases and is severe.  You develop painful lumps near the wound or on skin that is anywhere on your body.  You have a red streak going away from your wound.  The wound is on your hand or foot and you cannot properly move a finger or toe.  The wound is on your hand or foot and you notice that your fingers or toes look pale or bluish.   This information is not intended to replace advice given to you by your health care provider. Make sure you discuss any questions you have with your health care provider.   Document Released: 07/04/2005 Document Revised: 11/18/2014 Document Reviewed: 06/30/2014 [employer] Interactive Patient Education [employer]

## 2015-08-31 NOTE — ED Provider Notes (Signed)
CSN: [id number]     Arrival date & time 08/31/15  1921 History  By signing my name below, I, Linus Galas, attest that this documentation has been prepared under the direction and in the presence of Geoffery Lyons, MD. Electronically Signed: Linus Galas, ED Scribe. 08/31/2015. 9:10 PM.   Chief Complaint  Patient presents with  . Fall  . Laceration   The history is provided by the patient. No language interpreter was used.   HPI Comments: Phillip Kelley is a 82 y.o. male with a PMHx of HTN, CHF, DVT, NICM, and paroxysmal A-fib who presents to the Emergency Department complaining of RLE laceration s/p 1 day ago. Pt reports that his leg became weak causing him to fall. Pt is not sure what he hit his leg on. Pt states he has a home nurse who noted the laceration and applied dressing. She referred the pt to the ED for further evaluation.  Pt denies hitting his head. Pt is on Eliquis.   Past Medical History  Diagnosis Date  . Cataract   . Hypertension   . Chronic diastolic CHF (congestive heart failure) (HCC)     a. 09/2014 Echo: EF 65-70%, sev LVH, Gr 1 DD, mod dil LA.  Marland Kitchen Symptomatic bradycardia     a. 01/2012 s/p Medtronic BiV PPM  . Anemia   . History of blood transfusion 2001    following hip surgery.  . Arthritis   . Chronic lower back pain   . Leg edema   . History of DVT (deep vein thrombosis)   . NICM (nonischemic cardiomyopathy) (HCC)     a. 09/2014 Echo: EF 65-70%.  . Gait instability   . ETOH abuse   . PAF (paroxysmal atrial fibrillation) (HCC)     a. No OAC 2/2 unsteady gait/falls.   Past Surgical History  Procedure Laterality Date  . Anterior fusion cervical spine  2001    for numbness in right arm, left with anhydrosis  . Total hip arthroplasty  2001    right; fell  . Carotid endarterectomy  ~ 2008    left  . Carpal tunnel release      bilaterally  . Insert / replace / remove pacemaker  01/25/12    Medtronic BiV PPM  . Tonsillectomy and adenoidectomy  ~ 1933   . Joint replacement    . Cataract extraction w/ intraocular lens implant  ~2011    left  . Ganglion cyst excision      ? left  . Bi-ventricular pacemaker insertion N/A 01/25/2012    Procedure: BI-VENTRICULAR PACEMAKER INSERTION (CRT-P);  Surgeon: Marinus Maw, MD;  Location: [facility] CATH LAB;  Service: Cardiovascular;  Laterality: N/A;   Family History  Problem Relation Age of Onset  . Congestive Heart Failure Mother    Social History  Substance Use Topics  . Smoking status: Former Smoker -- 1.00 packs/day for 35 years    Types: Cigarettes    Quit date: 08/21/1977  . Smokeless tobacco: Never Used  . Alcohol Use: 12.6 oz/week    14 Glasses of wine, 7 Shots of liquor per week     Comment: As much as possible    Review of Systems  Skin: Positive for wound.  All other systems reviewed and are negative.  Allergies  Oxycodone; Penicillins; Sulfa antibiotics; and Losartan  Home Medications   Prior to Admission medications   Medication Sig Start Date End Date Taking? Authorizing Provider  amiodarone (PACERONE) 200 MG tablet Take 100 mg  by mouth daily. Patient taking by mouth one half tablet (100 mg) daily 08/19/14   Historical Provider, MD  apixaban (ELIQUIS) 5 MG TABS tablet Take 2 tablets (10 mg total) by mouth 2 (two) times daily. Patient not taking: Reported on 07/25/2015 06/24/15   Jeralyn Bennett, MD  apixaban (ELIQUIS) 5 MG TABS tablet Take 1 tablet (5 mg total) by mouth 2 (two) times daily. 06/30/15   Jeralyn Bennett, MD  azithromycin (ZITHROMAX) 250 MG tablet Take 1 tablet (250 mg total) by mouth daily. Patient not taking: Reported on 07/25/2015 06/24/15   Jeralyn Bennett, MD  Calcium Carbonate-Vitamin D (CALTRATE 600+D PO) Take 1 tablet by mouth 2 (two) times daily.    Historical Provider, MD  docusate sodium (COLACE) 100 MG capsule Take 1 capsule (100 mg total) by mouth 2 (two) times daily. 06/24/15   Jeralyn Bennett, MD  furosemide (LASIX) 20 MG tablet TAKE 1 TABLET (20 MG TOTAL)  BY MOUTH DAILY. 07/14/15   Marinus Maw, MD  hydrALAZINE (APRESOLINE) 25 MG tablet Take 25 mg by mouth 2 (two) times daily.    Historical Provider, MD  levothyroxine (SYNTHROID, LEVOTHROID) 50 MCG tablet Take 50 mcg by mouth daily.  11/19/14   Historical Provider, MD  Multiple Vitamin (MULTIVITAMIN) tablet Take 1 tablet by mouth daily.    Historical Provider, MD  Omega-3 Fatty Acids (FISH OIL) 1000 MG CAPS Take 1,000 mg by mouth 2 (two) times daily.    Historical Provider, MD  polyethylene glycol (MIRALAX / GLYCOLAX) packet Take 17 g by mouth daily. 07/25/15   Gilda Crease, MD  predniSONE (DELTASONE) 10 MG tablet Take 30 mg PO q daily for 3 days, followed by 20 mg PO q daily for 3 days, followed by 10 mg PO q daily for 3 days 07/25/15   Earley Favor, NP  PROAIR HFA 108 (90 Base) MCG/ACT inhaler Inhale 2 puffs into the lungs every 6 (six) hours as needed. For shortness of breath 07/10/15   Historical Provider, MD  Tamsulosin HCl (FLOMAX) 0.4 MG CAPS Take 0.4 mg by mouth 2 (two) times daily.     Historical Provider, MD  Triamcinolone Acetonide (TRIAMCINOLONE 0.1 % CREAM : EUCERIN) CREA Apply 1 application topically 2 (two) times daily. 06/24/15   Jeralyn Bennett, MD   BP 177/87 mmHg  Pulse 112  Temp(Src) 97.6 F (36.4 C) (Oral)  Resp 20  Wt 120 lb (54.432 kg)  SpO2 98%   Physical Exam  Constitutional: He is oriented to person, place, and time. He appears well-developed and well-nourished. No distress.  HENT:  Head: Normocephalic and atraumatic.  Mouth/Throat: Oropharynx is clear and moist. No oropharyngeal exudate.  Eyes: Conjunctivae and EOM are normal. Pupils are equal, round, and reactive to light.  Neck: Normal range of motion. Neck supple.  No meningismus.  Cardiovascular: Normal rate, regular rhythm, normal heart sounds and intact distal pulses.   No murmur heard. Pulmonary/Chest: Effort normal and breath sounds normal. No respiratory distress.  Abdominal: Soft. There is no  tenderness. There is no rebound and no guarding.  Musculoskeletal: Normal range of motion. He exhibits no edema or tenderness.  Neurological: He is alert and oriented to person, place, and time. No cranial nerve deficit. He exhibits normal muscle tone. Coordination normal.  No ataxia on finger to nose bilaterally. No pronator drift. 5/5 strength throughout. CN 2-12 intact.Equal grip strength. Sensation intact.   Skin: Skin is warm.  Medial aspect of RLE has a large skin tear present. There  is clotted blood over the area with no active bleeding.   There are old abrasions to the middle of the back. There is mild surrounding erythema but no purulent drainage.   Psychiatric: He has a normal mood and affect. His behavior is normal.  Nursing note and vitals reviewed.   ED Course  Procedures   DIAGNOSTIC STUDIES: Oxygen Saturation is 98% on room air, normal by my interpretation.    COORDINATION OF CARE: 9:10 PM Discussed treatment plan with pt at bedside and pt agreed to plan.  MDM   Final diagnoses:  None    Patient is an 82 year old male for evaluation of a skin tear to his right lower leg. He is on Eliquis and required lengthy direct pressure for hemostasis. The injury occurred yesterday and was evaluated by his home health nurse today who wanted him checked in the emergency room for possible laceration repair. Due to the nature of the injury and length of time since injury, there is no indication for sutures. This will be treated with dressing changes and local wound care.  I personally performed the services described in this documentation, which was scribed in my presence. The recorded information has been reviewed and is accurate.       Geoffery Lyons, MD 08/31/15 [phone]

## 2015-08-31 NOTE — ED Notes (Signed)
MD at bedside. 

## 2015-09-01 ENCOUNTER — Telehealth: Payer: Self-pay | Admitting: *Deleted

## 2015-09-01 NOTE — Telephone Encounter (Signed)
Pharmacy called related to Rx: cephALEXin (KEFLEX) 500 MG capsule allergy  .Marland KitchenMarland KitchenEDCM clarified with EDP to change Rx to: clindamycin 300 mg take 1 tablet TID for 5 days #15.

## 2015-09-10 ENCOUNTER — Other Ambulatory Visit: Payer: Self-pay | Admitting: Internal Medicine

## 2015-09-16 ENCOUNTER — Encounter: Payer: Self-pay | Admitting: *Deleted

## 2015-09-28 ENCOUNTER — Other Ambulatory Visit: Payer: Self-pay | Admitting: Internal Medicine

## 2015-10-13 ENCOUNTER — Encounter: Payer: Medicare Other | Admitting: *Deleted

## 2015-10-13 ENCOUNTER — Telehealth: Payer: Self-pay | Admitting: Internal Medicine

## 2015-10-13 NOTE — Telephone Encounter (Signed)
Phillip Kelley says that Phillip Kelley is so feeble he is unable to come to the office to see Dr. [name]. I instructed her that we will monitor his pacemaker remotely every three months and if something changes for her to call us back. I instructed her how to send a remote transmission- she verbalizes understanding and is appreciative.

## 2015-10-13 NOTE — Telephone Encounter (Signed)
New Message:   Pt needs to have his pacemaker check,but it is impossible to get him here.Please call,she needs your advice.

## 2015-10-16 ENCOUNTER — Encounter: Payer: Self-pay | Admitting: Cardiology

## 2015-12-17 DEATH — deceased
